# Patient Record
Sex: Female | Born: 1940 | Race: White | Hispanic: No | Smoking: Former smoker
Health system: Southern US, Community
[De-identification: ages and names within clinical notes are randomized; demographics above are authoritative.]

## PROBLEM LIST (undated history)

## (undated) DIAGNOSIS — E079 Disorder of thyroid, unspecified: Secondary | ICD-10-CM

## (undated) DIAGNOSIS — R251 Tremor, unspecified: Secondary | ICD-10-CM

## (undated) DIAGNOSIS — N189 Chronic kidney disease, unspecified: Secondary | ICD-10-CM

## (undated) DIAGNOSIS — F32A Depression, unspecified: Secondary | ICD-10-CM

## (undated) DIAGNOSIS — I639 Cerebral infarction, unspecified: Secondary | ICD-10-CM

## (undated) DIAGNOSIS — M1990 Unspecified osteoarthritis, unspecified site: Secondary | ICD-10-CM

## (undated) DIAGNOSIS — G629 Polyneuropathy, unspecified: Secondary | ICD-10-CM

## (undated) DIAGNOSIS — I10 Essential (primary) hypertension: Secondary | ICD-10-CM

## (undated) DIAGNOSIS — F329 Major depressive disorder, single episode, unspecified: Secondary | ICD-10-CM

## (undated) DIAGNOSIS — E785 Hyperlipidemia, unspecified: Secondary | ICD-10-CM

## (undated) DIAGNOSIS — E039 Hypothyroidism, unspecified: Secondary | ICD-10-CM

## (undated) DIAGNOSIS — F419 Anxiety disorder, unspecified: Secondary | ICD-10-CM

## (undated) HISTORY — PX: ABDOMINAL HYSTERECTOMY: SHX81

## (undated) HISTORY — PX: ANKLE SURGERY: SHX546

---

## 2004-06-13 ENCOUNTER — Ambulatory Visit: Payer: Self-pay | Admitting: Unknown Physician Specialty

## 2004-06-15 ENCOUNTER — Ambulatory Visit: Payer: Self-pay | Admitting: Unknown Physician Specialty

## 2004-10-05 ENCOUNTER — Ambulatory Visit: Payer: Self-pay | Admitting: Orthopedic Surgery

## 2005-04-13 ENCOUNTER — Emergency Department: Payer: Self-pay | Admitting: Emergency Medicine

## 2006-02-05 ENCOUNTER — Ambulatory Visit: Payer: Self-pay | Admitting: Family Medicine

## 2006-11-15 ENCOUNTER — Ambulatory Visit: Payer: Self-pay | Admitting: Orthopedic Surgery

## 2007-02-06 ENCOUNTER — Ambulatory Visit: Payer: Self-pay | Admitting: Emergency Medicine

## 2008-02-22 ENCOUNTER — Ambulatory Visit: Payer: Self-pay | Admitting: Family Medicine

## 2008-03-18 ENCOUNTER — Ambulatory Visit: Payer: Self-pay | Admitting: Orthopedic Surgery

## 2008-06-19 ENCOUNTER — Ambulatory Visit: Payer: Self-pay | Admitting: Internal Medicine

## 2008-07-18 ENCOUNTER — Ambulatory Visit: Payer: Self-pay | Admitting: Family Medicine

## 2008-10-11 ENCOUNTER — Ambulatory Visit: Payer: Self-pay | Admitting: Internal Medicine

## 2009-03-23 ENCOUNTER — Ambulatory Visit: Payer: Self-pay | Admitting: Family Medicine

## 2009-03-30 ENCOUNTER — Ambulatory Visit: Payer: Self-pay | Admitting: Internal Medicine

## 2009-04-17 ENCOUNTER — Emergency Department: Payer: Self-pay | Admitting: Emergency Medicine

## 2009-04-20 ENCOUNTER — Emergency Department: Payer: Self-pay | Admitting: Emergency Medicine

## 2010-05-20 ENCOUNTER — Ambulatory Visit: Payer: Self-pay | Admitting: Internal Medicine

## 2011-10-03 ENCOUNTER — Ambulatory Visit: Payer: Self-pay | Admitting: Nephrology

## 2013-06-13 ENCOUNTER — Observation Stay: Payer: Self-pay | Admitting: Specialist

## 2013-06-13 LAB — DRUG SCREEN, URINE
Barbiturates, Ur Screen: NEGATIVE (ref ?–200)
Cocaine Metabolite,Ur ~~LOC~~: NEGATIVE (ref ?–300)
Methadone, Ur Screen: NEGATIVE (ref ?–300)
Opiate, Ur Screen: NEGATIVE (ref ?–300)
Phencyclidine (PCP) Ur S: NEGATIVE (ref ?–25)
Tricyclic, Ur Screen: NEGATIVE (ref ?–1000)

## 2013-06-13 LAB — COMPREHENSIVE METABOLIC PANEL
Albumin: 3.7 g/dL (ref 3.4–5.0)
BUN: 24 mg/dL — ABNORMAL HIGH (ref 7–18)
Bilirubin,Total: 0.5 mg/dL (ref 0.2–1.0)
Calcium, Total: 9.7 mg/dL (ref 8.5–10.1)
Chloride: 100 mmol/L (ref 98–107)
Creatinine: 1.81 mg/dL — ABNORMAL HIGH (ref 0.60–1.30)
EGFR (African American): 31 — ABNORMAL LOW
EGFR (Non-African Amer.): 27 — ABNORMAL LOW
Osmolality: 275 (ref 275–301)
SGOT(AST): 36 U/L (ref 15–37)

## 2013-06-13 LAB — URINALYSIS, COMPLETE
Glucose,UR: NEGATIVE mg/dL (ref 0–75)
Hyaline Cast: 38
Nitrite: NEGATIVE
Ph: 5 (ref 4.5–8.0)
RBC,UR: 1 /HPF (ref 0–5)
Specific Gravity: 1.013 (ref 1.003–1.030)
Squamous Epithelial: 21

## 2013-06-13 LAB — CBC WITH DIFFERENTIAL/PLATELET
Eosinophil #: 0.4 10*3/uL (ref 0.0–0.7)
HCT: 42.9 % (ref 35.0–47.0)
HGB: 14.6 g/dL (ref 12.0–16.0)
Lymphocyte %: 31.1 %
MCH: 31.4 pg (ref 26.0–34.0)
MCV: 92 fL (ref 80–100)
Monocyte #: 1.1 x10 3/mm — ABNORMAL HIGH (ref 0.2–0.9)
Neutrophil #: 5.3 10*3/uL (ref 1.4–6.5)
Neutrophil %: 53.3 %

## 2013-06-13 LAB — MAGNESIUM: Magnesium: 1.6 mg/dL — ABNORMAL LOW

## 2013-06-13 LAB — SEDIMENTATION RATE: Erythrocyte Sed Rate: 5 mm/hr (ref 0–30)

## 2013-06-13 LAB — LIPASE, BLOOD: Lipase: 414 U/L — ABNORMAL HIGH (ref 73–393)

## 2013-06-13 LAB — CK TOTAL AND CKMB (NOT AT ARMC): CK, Total: 55 U/L (ref 21–215)

## 2013-06-13 LAB — TROPONIN I: Troponin-I: 0.19 ng/mL — ABNORMAL HIGH

## 2013-06-13 LAB — ETHANOL: Ethanol %: <0.003 % (ref 0.000–0.080)

## 2013-06-14 ENCOUNTER — Ambulatory Visit: Payer: Self-pay | Admitting: Neurology

## 2013-06-14 LAB — TROPONIN I
Troponin-I: 0.17 ng/mL — ABNORMAL HIGH
Troponin-I: 0.15 ng/mL — ABNORMAL HIGH

## 2013-06-14 LAB — CBC WITH DIFFERENTIAL/PLATELET
Basophil #: 0.1 10*3/uL (ref 0.0–0.1)
Basophil %: 0.6 %
Eosinophil %: 4.9 %
HCT: 39.7 % (ref 35.0–47.0)
Lymphocyte #: 3.3 10*3/uL (ref 1.0–3.6)
Lymphocyte %: 36.4 %
MCH: 31.3 pg (ref 26.0–34.0)
MCHC: 34.1 g/dL (ref 32.0–36.0)
MCV: 92 fL (ref 80–100)
Monocyte #: 0.9 x10 3/mm (ref 0.2–0.9)
Neutrophil %: 47.8 %
RDW: 13.7 % (ref 11.5–14.5)
WBC: 9 10*3/uL (ref 3.6–11.0)

## 2013-06-14 LAB — LIPID PANEL
Ldl Cholesterol, Calc: 124 mg/dL — ABNORMAL HIGH (ref 0–100)
Triglycerides: 245 mg/dL — ABNORMAL HIGH (ref 0–200)
VLDL Cholesterol, Calc: 49 mg/dL — ABNORMAL HIGH (ref 5–40)

## 2013-06-14 LAB — BASIC METABOLIC PANEL
BUN: 23 mg/dL — ABNORMAL HIGH (ref 7–18)
Chloride: 100 mmol/L (ref 98–107)
Glucose: 111 mg/dL — ABNORMAL HIGH (ref 65–99)
Osmolality: 278 (ref 275–301)
Potassium: 3.5 mmol/L (ref 3.5–5.1)
Sodium: 137 mmol/L (ref 136–145)

## 2013-06-14 LAB — CK TOTAL AND CKMB (NOT AT ARMC)
CK, Total: 77 U/L (ref 21–215)
CK-MB: 1.2 ng/mL (ref 0.5–3.6)
CK-MB: 1.1 ng/mL (ref 0.5–3.6)

## 2013-06-15 LAB — CBC WITH DIFFERENTIAL/PLATELET
Eosinophil #: 0.6 10*3/uL (ref 0.0–0.7)
Eosinophil %: 7.3 %
HCT: 39 % (ref 35.0–47.0)
Lymphocyte %: 37.5 %
MCH: 31.3 pg (ref 26.0–34.0)
MCHC: 33.7 g/dL (ref 32.0–36.0)
MCV: 93 fL (ref 80–100)
Monocyte %: 9.6 %
Neutrophil %: 44.6 %
RBC: 4.2 10*6/uL (ref 3.80–5.20)
RDW: 13.7 % (ref 11.5–14.5)
WBC: 7.6 10*3/uL (ref 3.6–11.0)

## 2013-06-15 LAB — BASIC METABOLIC PANEL
Anion Gap: 5 — ABNORMAL LOW (ref 7–16)
BUN: 23 mg/dL — ABNORMAL HIGH (ref 7–18)
Calcium, Total: 9.7 mg/dL (ref 8.5–10.1)
Co2: 29 mmol/L (ref 21–32)
EGFR (African American): 38 — ABNORMAL LOW
Sodium: 139 mmol/L (ref 136–145)
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
## 2014-12-10 NOTE — H&P (Signed)
PATIENT NAME:  Jenna Powers, Jenna Powers MR#:  [id number] DATE OF BIRTH:  03/14/1941  DATE OF ADMISSION:  06/13/2013  PRIMARY CARE PROVIDER: Burley Saver, MD  EMERGENCY DEPARTMENT REFERRING PHYSICIAN: Eartha Inch. York Cerise, MD  CHIEF COMPLAINT: Altered mental status, hand numbness, some visual difficulties.   HISTORY OF PRESENT ILLNESS: The patient is a 74 year old white female with history of chronic back pain, depression, hypothyroidism, history of ADHD, hypertension, migraine headaches, who reports that about 2 months ago she was on blood pressure medications and stopped taking these because she felt like she did not need the medications and was taking too many medications. She reports yesterday she started having numbness in her left hand and went to the pharmacy and was noted to have a blood pressure that was elevated, so was seen by Dr. Quillian Quince and was started back on her lisinopril and HCTZ. The patient reports that this a.m. she was watching TV and on the right side when the letters were coming for a TV show, "[employer] Beautiful," she could not see the right side of the letters. She stated that that was blurred. Therefore, she went to an urgent care at [facility], where she was observed to be wandering around in a parking lot. According to the patient, she was just looking for [facility]. She has been a little confused here in the ER. Initially she said it was January 2014, then she was saying it was March 2014. She otherwise is able to tell me other things. She reports that she has not had any chest pains or shortness of breath. Denies any fevers, chills. No abdominal pain, nausea, vomiting or diarrhea. Denies any urinary frequency, urgency or hesitancy.   PAST MEDICAL HISTORY: Significant for chronic back pain, history of hypothyroidism, depression, ADHD, hypertension, migraines.   PAST SURGICAL HISTORY: Status post cholecystectomy, status post hysterectomy, right ankle replacement.   SOCIAL HISTORY: Does  not smoke. Drinks wine, a glass daily. No drug use. Lives by herself.   ALLERGIES: CODEINE.   MEDICATIONS: She is on propranolol 80 one tab p.o. daily, aspirin 81 one tab p.o. daily, lisinopril/HCTZ 20/12.5 daily, Celebrex 200 daily, oxybutynin 5 one tab p.o. t.i.d., tramadol 50 with 1 to 2 tabs q.8 p.r.n. The patient does report she is on Prozac, but we do not have that dosage. She also reports that she takes some sleeping pill. She is not sure of the name.  REVIEW OF SYSTEMS:    CONSTITUTIONAL: Denies any fevers, fatigue, weakness. Has chronic low back pain. No weight loss. No weight gain.  EYES: Does complain of some blurred vision, which has since resolved this morning. No pain. No redness. No inflammation. No glaucoma. No cataracts.  EARS, NOSE, THROAT: No tinnitus. No ear pain. No hearing loss. No seasonal or year-round allergies. No epistaxis. No nasal discharge. No difficulty swallowing.  RESPIRATORY: Denies any cough, wheezing, hemoptysis. No COPD. No TB. CARDIOVASCULAR: Denies any chest pain, orthopnea, edema or arrhythmia.  GASTROINTESTINAL: No nausea, vomiting, diarrhea. No abdominal pain. No hematemesis. No melena. No ulcer. No GERD. No IBS. No jaundice.  GENITOURINARY: Denies any dysuria, hematuria, renal calculus or frequency.  ENDOCRINE: Denies any polyuria, nocturia or thyroid problems.  HEMATOLOGIC AND LYMPHATIC: Denies anemia, easy bruisability or bleeding.  SKIN: No acne. No rash. No changes in mole, hair or skin.  MUSCULOSKELETAL: Denies any pain in neck. Does have chronic back pain.  NEUROLOGIC: Hand numbness yesterday. No history of CVA, TIA or seizures.  PSYCHIATRIC: Has a history  of anxiety, depression.   PHYSICAL EXAMINATION: VITAL SIGNS: Temperature 97.8, pulse 43, respirations 16, blood pressure 137/110.  GENERAL: The patient is a well-developed, well-nourished female in no acute distress.  HEENT: Head atraumatic, normocephalic. Pupils equally round, reactive to  light and accommodation. There is no conjunctival pallor. No scleral icterus. Nasal exam shows no drainage or ulceration. Oropharynx is clear without any exudate.  NECK: Supple without any JVD.  CARDIOVASCULAR: Regular rate and rhythm. No murmurs, rubs, clicks or gallops.  RESPIRATORY: Good respiratory effort. Clear to auscultation bilaterally without any rales, rhonchi, wheezing.  ABDOMEN: Soft, nontender, nondistended. Positive bowel sounds x 4. No hepatosplenomegaly.  GENITOURINARY: Deferred.  MUSCULOSKELETAL: There is no erythema or swelling.  SKIN: No rash.  LYMPHATICS: No lymph nodes palpable.  VASCULAR: Good DP, PT pulses.  NEUROLOGIC: Currently, cranial nerves II through XII grossly intact. DTRs are symmetric, reflexes 2+. Babinski is downgoing.  PSYCHIATRIC: The patient is not anxious or depressed.   LABORATORY DATA: Glucose 112, BUN 24, creatinine 1.81, sodium 135, potassium 4.4, chloride 100, CO2 of 29. Calcium is 9.7. Magnesium is 1.6. Lipase 416. LFTs are normal. Troponin 0.19. TSH is 3.66.WBC 10, hemoglobin 14.6. Sedimentation rate 5. Urinalysis is bacteria 1+.,  WBCs only 5.   ASSESSMENT AND PLAN: The patient is a 74 year old white female with history of chronic back pain, hypertension, hypothyroidism, depression, attention deficit/hyperactivity disorder, presents with unusual constellation of symptoms including left hand numbness, visual difficulties, some confusion.  1.  Acute encephalopathy with visual difficulties and numbness, possibly due to transient ischemic attack/cerebrovascular accident. At this time, will treat her with aspirin, get an MRI of the brain, check carotid Dopplers and echocardiogram of the heart. Her presentation is very unusual. Need to make sure that this is not a psychiatric cause for her symptoms as well. I will have psychiatry come to see the patient, also ask neurology to see the patient.  2.  Hypertension: We will continue lisinopril. Hold propranolol  due to low heart rate.  3.  Bradycardia: Possibly due to propranolol therapy. We will hold her propranolol.  4.  Depression: On  Prozac at home. Currently, dose not available. Resume this once dose is known. 5.  History of hypothyroidism: Stop supplements. Her TSH seems to be normal at this time.  6.  Attention deficit/hyperactivity disorder: Was on Ritalin, which was stopped 2 months ago. 7.  Elevated troponin: We will check serial cardiac enzymes. Aspirin. Echocardiogram. The ED physician has spoken to Dr. Gwen PoundsKowalski who will see.  8.  Elevated creatinine: No baseline creatinine available. We will give her IV fluids, hold HCTZ.  9.  Miscellaneous: I will place her on Lovenox for deep vein thrombosis prophylaxis.   TIME SPENT: 45 minutes spent on this H and P.    ____________________________ Lacie ScottsShreyang H. Allena KatzPatel, MD shp:jm D: 06/13/2013 19:08:24 ET T: 06/13/2013 19:50:31 ET JOB#: [id number]  cc: Jozette Castrellon H. Allena KatzPatel, MD, <Dictator> Charise CarwinSHREYANG H Darl Kuss MD ELECTRONICALLY SIGNED 06/16/2013 12:13

## 2014-12-10 NOTE — Consult Note (Signed)
PATIENT NAME:  Jenna PlowmanVAN SAVAGE, Jenna Powers MR#:  [id number] DATE OF BIRTH:  [birth year]  DATE OF CONSULTATION:  06/14/2013  REFERRING PHYSICIAN:   CONSULTING PHYSICIAN:  Kinsly Hild K. Elna Radovich, MD  SUBJECTIVE:  The patient was seen in consultation in room number 258.  The patient is Powers 74 year old white female who is retired and is widowed after her third marriage for 5 years and living by herself.  The patient reports that in Powers span of 5 years she lost several of her family members and she took care of them.  This includes Powers husband, her mother, Powers sister, Powers brother and this has caused her to have lots of depression.  The patient is being followed for depression by her PCP, who gave her Prozac 20 mg twice Powers day which held her depression for many years but currently she is feeling low and down and depressed.    PAST PSYCHIATRIC HISTORY:  No previous history of inpatient psychiatry.  No suicidal ideation.  Not being followed by Powers psychiatrist.    ALCOHOL AND DRUGS:  Has an occasional wine.  Smoked 1 cigarette in Powers span of 1 year.    MENTAL STATUS EXAMINATION:  The patient is dressed in hospital clothes, alert and oriented, calm, pleasant and cooperative.  Affect is appropriate with her mood which is low and down and depressed.  At times, she feels worthless and useless, and hopeless because of her not being able to do things and recently being forgetful because of being very anxious and depressed.  No psychosis.  Denies suicidal or homicidal plans and wants to get help with medication for her depression and anxiety, and forgetfulness.  Insight and judgment fair and adequate.    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  RECOMMEND:  Increase her Prozac to 60 mg daily.  Add Abilify 5 mg daily to act as an adjunct.  Recommend Aricept which will improve her cognition, make her feel better.    ____________________________ Jannet MantisSurya K. Guss Bundehalla, MD skc:cs D: 06/14/2013 14:50:58  ET T: 06/14/2013 19:51:24 ET JOB#: [id number]  cc: Monika SalkSurya K. Guss Bundehalla, MD, <Dictator> Beau FannySURYA K Kashena Novitski MD ELECTRONICALLY SIGNED 06/15/2013 8:46

## 2014-12-10 NOTE — Discharge Summary (Signed)
PATIENT NAME:  Jenna Powers, Jenna Powers MR#:  [id number] DATE OF BIRTH:  [birth year]  DATE OF ADMISSION:  06/13/2013 DATE OF DISCHARGE:  06/15/2013  For a detailed note, please take a look at the history and physical done on admission by Dr. Auburn Bilberry.   DIAGNOSES AT DISCHARGE: 1.  Altered mental status, likely to underlying dementia and early cognitive decline.  2.  Hypertension.  3.  Chronic kidney disease stage III elevated troponin right in the setting of demand ischemia, urinary incontinence.   DIET: The patient is being discharged on a low-sodium, low-fat diet.   ACTIVITY: As tolerated.   FOLLOW-UP: Dr. Gwen Pounds in the next 1 to 2 weeks. Also, follow up with Dr. Rolin Barry  in the next 1 to 2 weeks.   DISCHARGE MEDICATIONS:  Celebrex 200 mg daily,  Tramadol 50 mg 1 to 2 tabs q. 8 hours as needed, aspirin 81 mg daily, propranolol 80 mg daily, fluoxetine 60 mg daily, Abilify 5 mg daily, Aricept 500 mg bedtime, lisinopril 20 mg daily and Pravachol 20 mg at bedtime.   CONSULTANTS DURING THE HOSPITAL COURSE:  1.  Dr. Olin Pia from neurology.    2.  Dr. Guss Bunde from psychiatry.  3.  Dr. Arnoldo Hooker from cardiology.   PERTINENT STUDIES DONE DURING THE HOSPITAL COURSE: A CT scan of the head done without contrast on admission showing no acute intracranial process, chronic involutional changes.   A chest x-ray done on admission showing no acute cardiopulmonary disease. An MRI of the brain done without contrast showing no evidence of any acute abnormalities. An ultrasound of the carotids showing no hemodynamically significant carotid artery stenosis. A 2-dimensional echocardiogram showing ejection fraction of 35% to 40%, mildly decreased global LV systolic function, moderately dilated left atrium, mildly dilated right atrium, mild to moderate mitral valve regurgitation, mildly elevated pulmonary artery systolic pressures, mild to moderate tricuspid regurgitation.   HOSPITAL COURSE: This is  a 74 year old female with medical problems as mentioned above, presented to the hospital due to altered mental status.  1.  Altered mental status. The most likely cause of this was probably cognitive decline in underlying dementia. There was some concern initially that the patient may have had a stroke as she was having some right arm numbness. She underwent extensive work-up including CT head MRI brain a carotid duplex, which were all negative. The patient was seen by neurology who did not think that the patient an acute stroke. The patient was seen by physical therapy who recommended an outpatient services. The patient's urine toxicology was also clean. She had no other focal metabolic or infectious etiology of her altered mental status. The patient was seen by psychiatry and thought that her she may have early cognitive decline with underlying depression. Her Prozac dose was increased from 20 mg a 60 mg. Abilify was also added and the patient was started on low-dose Aricept. The patient was discharged on all those meds.  2.  Elevated troponin. The patient acutely had no chest pain. She had no acute EKG changes. Her elevated troponin could be related to poor renal clearance versus underlying demand ischemia. The patient was maintained on her aspirin and Inderal. She will resume that. The patient was seen by cardiology who did not want to do any acute interventional in the hospital. She had an echocardiogram, which showed mild LV dysfunction. I discussed the case with Dr. Gwen Pounds, who plans on doing an outpatient stress test. The patient will have follow-up Dr. Gwen Pounds next  week arranged as an outpatient.  3.  Chronic kidney disease stage III. This is probably related to her underlying hypertension. Her creatinine further needs to be followed as an outpatient by her primary care physician.  4.  Hypertension. The patient remained hemodynamically on lisinopril and Inderal. She will resume that.  5.   Depression. A psychiatric consult was obtained. The patient was seen by Dr. Guss Bundehalla who recommended starting the patient on low-dose Abilify along with, Aricept and also increasing her Prozac and those changes, were made prior to discharge.   CODE STATUS:  The patient is a full code.   DISPOSITION: She is being discharged home.   TIME SPENT: 40 minutes.   ____________________________ Jenna Powers, Jenna Powers vjs:cc D: 06/15/2013 15:03:29 ET T: 06/15/2013 23:17:50 ET JOB#: [id number]  cc: Jenna Powers, Jenna Powers, <Dictator> Jenna BlinksBruce J. Powers, Jenna Powers Jenna Powers Jenna Powers ELECTRONICALLY SIGNED 06/17/2013 14:34

## 2014-12-10 NOTE — Consult Note (Signed)
PATIENT NAME:  Jenna PlowmanVAN SAVAGE, Yuleidy A MR#:  [id number] DATE OF BIRTH:  28-Oct-1940  DATE OF CONSULTATION:  06/14/2013  REFERRING PHYSICIAN:  Dr. Allena KatzPatel. CONSULTING PHYSICIAN:  Lamar BlinksBruce J. Kupono Marling, MD  REASON FOR CONSULTATION: Bradycardia, elevated troponin, chronic kidney disease, transient ischemic attack and/or stroke, unsteadiness and hypertension.   CHIEF COMPLAINT: "I just was unsteady and I had numbness."   HISTORY OF PRESENT ILLNESS: This is a 74 year old female with known chronic kidney disease with a creatinine of 1.8, hypertension, hyperlipidemia, who has had some bradycardia in the past due to propranolol use, of which the patient has been using this for tremors. The patient has had no evidence of symptoms of syncope or dizziness, but has been somewhat unsteady over the last day or two, as well as had some numbness in her left arm and some tongue-tied. The patient has had hypertension as well with appropriate medication and chronic kidney disease. Currently with this admission the patient has had an EKG showing sinus bradycardia, but otherwise no evidence of new EKG changes or heart block by telemetry. She does have an elevated troponin at 0.19 of unknown etiology at this point and trending steadily and evenly without evidence of increase. The patient currently feels well with no further evidence of chest discomfort or heart failure type symptoms.   REVIEW OF SYSTEMS: The remainder review of systems negative for vision change, ringing in the ears, hearing loss, cough, congestion, heartburn, nausea, vomiting, diarrhea, bloody stools, stomach pain, extremity pain, leg weakness, cramping of the buttocks, known blood clots, headaches, blackouts, dizzy spells, nosebleeds, congestion, trouble swallowing, frequent urination, urination at night, muscle weakness, numbness, numbness as occurring in her left arm and some weakness in her lower extremities, no frequent urination, urination at night, muscle  abnormality.   PAST MEDICAL HISTORY: 1. Bradycardia.  2. Chronic kidney disease.  3. Hypertension.  4. Hyperlipidemia.  5. Tremor.  6. Chronic back pain.   FAMILY HISTORY: Father had a stroke. Mother had no cardiovascular stains.   SOCIAL HISTORY: She has remote tobacco abuse and does drink alcohol.   ALLERGIES: As listed.   MEDICATIONS: As listed.   PHYSICAL EXAMINATION: VITAL SIGNS: Blood pressure 126/68 bilaterally, heart rate is 52 upright, reclining, and regular.  GENERAL: She is a well-appearing elderly female in no acute distress.  HEENT: No icterus, thyromegaly, ulcers, hemorrhage, or xanthelasma.  CARDIOVASCULAR: Regular rate and rhythm with normal S1 and S2 without murmur, gallop, or rub. PMI is normal size and placement. Carotid upstroke normal without bruit. Jugular venous pressure is normal.  LUNGS: Lungs have few basilar crackles with normal respirations.  ABDOMEN: Soft, nontender, without hepatosplenomegaly or masses. Abdominal aorta is normal size without bruit.  EXTREMITIES: 2+ radial, femoral, dorsal pedal pulses, with no lower extremity edema, cyanosis, clubbing or ulcers.  NEUROLOGIC: She is oriented to time, place, and person, with normal mood and affect.   ASSESSMENT: This is a 74 year old female with chronic kidney disease, hypertension, hyperlipidemia with transient ischemic attack type symptoms, unsteadiness and bradycardia. All of unknown etiology and significance at this time.   RECOMMENDATIONS: 1. Continue serial ECG and enzymes to assess for possible myocardial infarction and/or further need in treatment options.  2. Continue hypertension control with possible ACE inhibitor due to chronic kidney disease.  3. Continue telemetry and  ambulate with telemetry and possible treadmill EKG and/or stress test to evaluate extent of rhythm disturbances, chronotropic incompetence for myocardial ischemia needing further treatment options. The patient may need to  discontinue  propranolol due to bradycardia.  4. Echocardiogram for LV systolic dysfunction, valvular heart disease causing symptoms listed above.  5. Further treatment options after above.   ____________________________ Lamar Blinks, MD bjk:sg D: 06/14/2013 08:56:00 ET T: 06/14/2013 13:09:54 ET JOB#: [id number]  cc: Lamar Blinks, MD, <Dictator> Lamar Blinks MD ELECTRONICALLY SIGNED 06/19/2013 14:46

## 2015-08-08 ENCOUNTER — Ambulatory Visit
Admission: EM | Admit: 2015-08-08 | Discharge: 2015-08-08 | Disposition: A | Discharge status: Home / Self Care | Payer: Medicare Other | Attending: Emergency Medicine | Admitting: Emergency Medicine

## 2015-08-08 ENCOUNTER — Encounter: Payer: Self-pay | Admitting: *Deleted

## 2015-08-08 ENCOUNTER — Ambulatory Visit (INDEPENDENT_AMBULATORY_CARE_PROVIDER_SITE_OTHER): Payer: Medicare Other

## 2015-08-08 DIAGNOSIS — S5292XA Unspecified fracture of left forearm, initial encounter for closed fracture: Secondary | ICD-10-CM

## 2015-08-08 HISTORY — DX: Essential (primary) hypertension: I10

## 2015-08-08 HISTORY — DX: Depression, unspecified: F32.A

## 2015-08-08 HISTORY — DX: Hyperlipidemia, unspecified: E78.5

## 2015-08-08 HISTORY — DX: Disorder of thyroid, unspecified: E07.9

## 2015-08-08 HISTORY — DX: Major depressive disorder, single episode, unspecified: F32.9

## 2015-08-08 HISTORY — DX: Tremor, unspecified: R25.1

## 2015-08-08 HISTORY — DX: Polyneuropathy, unspecified: G62.9

## 2015-08-08 MED ORDER — TRAMADOL HCL 50 MG PO TABS: 50.0000 mg | ORAL_TABLET | Freq: Two times a day (BID) | ORAL | Status: DC

## 2015-08-08 NOTE — ED Notes (Signed)
Ready for discharge. Wrist splint and sling applied. Waiting for L. Hyacinth MeekerMiller NP to speak with her

## 2015-08-08 NOTE — ED Provider Notes (Signed)
[facility]  ____________________________________________  Time seen: Approximately 4:44 PM  I have reviewed the triage vital signs and the nursing notes.   HISTORY  Chief Complaint Joint Swelling and Wrist Pain   HPI Jenna Powers is a 74 y.o. female presents with a complaint of left wrist pain. Patient reports that one week ago she was at home and states that ine the middle of the night she had to go to the bathroom. states she did not turn the light on. Patient states that she went to sit down the toilet however missed the toilet and fell to the floor. Patient states that she did reach out and catch herself with her left arm. Patient states that since then she has had left wrist pain. Denies head injury or loss of consciousness. Denies other pain or injury. Denies neck or back pain or injury.  Patient states that she has continued with left wrist pain throughout the week. Patient states that current left wrist pain is 3 out of 10 and reports maximum pain 7/10. Denies pain radiation. Patient denies numbness or tingling sensations or decreased range of motion. Patient reports that she fell only because she missed the toilet when sitting down. Patient states that she felt fine prior to the fall. Patient also reports that other than left wrist pain she felt fine after the fall. Patient states that she was able to get herself up without anyone's help. Denies other fall recently.  Patient reports that she has been taking Celebrex for her pain.  Patient states that this helps with her pain overall but does not always help. Patient reports that she is right hand dominant.  PCP: Dr. Quillian Quince   Past Medical History  Diagnosis Date  . Hypertension   . Thyroid disease   . Hyperlipemia   . Depression   . Tremor   . Neuropathy (HCC)     There are no active problems to display for this patient.   Past Surgical History  Procedure Laterality Date  . Abdominal hysterectomy    .  Ankle surgery Right     Current Outpatient Rx  Name  Route  Sig  Dispense  Refill  . aspirin EC 81 MG tablet   Oral   Take 81 mg by mouth daily.         .             . losartan-hydrochlorothiazide (HYZAAR) 50-12.5 MG tablet   Oral   Take 1 tablet by mouth daily.         Marland Kitchen oxybutynin (DITROPAN) 5 MG tablet   Oral   Take 5 mg by mouth 2 (two) times daily.          .             clebrex  Allergies Review of patient's allergies indicates no known allergies.  History reviewed. No pertinent family history.  Social History Social History  Substance Use Topics  . Smoking status: Current Some Day Smoker  . Smokeless tobacco: Never Used  . Alcohol Use: No    Review of Systems Constitutional: No fever/chills Eyes: No visual changes. ENT: No sore throat. Cardiovascular: Denies chest pain. Respiratory: Denies shortness of breath. Gastrointestinal: No abdominal pain.  No nausea, no vomiting.  No diarrhea.  No constipation. Genitourinary: Negative for dysuria. Musculoskeletal: Negative for back pain. Positive left wrist pain. Skin: Negative for rash. Neurological: Negative for headaches, focal weakness or numbness.  10-point ROS otherwise negative.  ____________________________________________   PHYSICAL EXAM:  VITAL SIGNS: ED Triage Vitals  Enc Vitals Group     BP 08/08/15 1605 162/81 mmHg     Pulse Rate 08/08/15 1605 58     Resp 08/08/15 1605 18     Temp 08/08/15 1605 97.5 F (36.4 C)     Temp Source 08/08/15 1605 Oral     SpO2 08/08/15 1605 97 %     Weight 08/08/15 1605 136 lb (61.689 kg)     Height 08/08/15 1605 5\' 6"  (1.676 m)     Head Cir --      Peak Flow --      Pain Score 08/08/15 1629 6     Pain Loc --      Pain Edu? --      Excl. in GC? --     Constitutional: Alert and oriented. Well appearing and in no acute distress. Eyes: Conjunctivae are normal. PERRL. EOMI. Head: Atraumatic.  Nose: No congestion/rhinnorhea.  Mouth/Throat: Mucous  membranes are moist.  Oropharynx non-erythematous. Neck: No stridor.  No cervical spine tenderness to palpation. Hematological/Lymphatic/Immunilogical: No cervical lymphadenopathy. Cardiovascular: Normal rate, regular rhythm. Grossly normal heart sounds.  Good peripheral circulation. Respiratory: Normal respiratory effort.  No retractions. Lungs CTAB. No wheezes, rales or rhonchi. Gastrointestinal: Soft and nontender. No distention. Normal Bowel sounds.  No abdominal bruits. No CVA tenderness. Musculoskeletal: No lower or upper extremity tenderness nor edema.  No cervical, thoracic or lumbar tenderness to palpation with full range of motion. Bilateral upper and lower extremities full range of motion and nontender except left distal wrist. Left distal radius mild to moderate tenderness to palpation with positive deformity, no pain with rotation, mild pain with wrist flexion, mild swelling, no ecchymosis, skin intact, left hand nontender, cap refill less than 2 seconds to all left fingers, bilateral distal radial pulses equal and easily palpable,  left hand grip slightly weaker than right. Left upper extremity otherwise nontender. Neurologic:  Normal speech and language. No gross focal neurologic deficits are appreciated. No gait instability. Skin:  Skin is warm, dry and intact. No rash noted. Psychiatric: Mood and affect are normal. Speech and behavior are normal.  ____________________________________________   LABS (all labs ordered are listed, but only abnormal results are displayed)  Labs Reviewed - No data to display  RADIOLOGY  EXAM: LEFT WRIST - COMPLETE 3+ VIEW  COMPARISON: None.  FINDINGS: Frontal, oblique lateral, and ulnar deviation scaphoid images were obtained. There is a comminuted fracture of the distal radial metaphysis with dorsal angulation distally. There is impaction at the fracture site. There are several small a avulsed fragments in this area. No other fracture is  apparent. No dislocation. There is mild narrowing at the scaphoid trapezial joint. There is also osteoarthritic change in all MCP joints. Calcification is noted in the triangular fibrocartilage region, likely due to chronic tear. Calcification is also noted between the radial styloid and scaphoid, likely due to chronic tear in this area as well.  IMPRESSION: Comminuted fracture distal radial metaphysis with impaction at the fracture site. There is dorsal angulation distally. No dislocation. Several areas of osteoarthritic change. Calcification in the triangular fibrocartilage may indicate prior tear in this area, chronic. Second probable chronic tear is located between the radial styloid and scaphoid bone.   Electronically Signed By: Bretta BangWilliam Woodruff III M.D. On: 08/08/2015 17:18  I, Renford DillsLindsey Kierstan Auer, personally viewed and evaluated these images (plain radiographs) as part of my medical decision making.   ____________________________________________   PROCEDURES  Procedure(s) performed:  Splint applied by Bennetta Laos. Left distal dorsal volar OCL splint. Neurovascular intact post application. ____________________________________________   INITIAL IMPRESSION / ASSESSMENT AND PLAN / ED COURSE  Pertinent labs & imaging results that were available during my care of the patient were reviewed by me and considered in my medical decision making (see chart for details).  Very well-appearing patient. No acute distress. Presents with complaint of left wrist pain post mechanical fall 1 week ago. Denies head injury or loss of  consciousness. Denies other pain or injury. Denies neck or back pain or injury. No other extremity tenderness. Lungs clear throughout. Abdomen soft and nontender. Left distal wrist positive deformity with mild distal radial pain. Will evaluate by x-ray.  Left wrist x-ray comminuted fracture distal radial metaphysis with impaction of the fracture site, there is  dorsal angulation distally, no dislocation, several areas of osteoarthritic change per radiology.  1730: Orthopedic on call paged.  1745: Discussed patient with Dr. Martha Clan orthopedic. Dr. Martha Clan reviewed x-rays. Dr. Martha Clan recommends dorsal volar OCL splint and sling, And will see patient in office this week in 2-3 days. Dr. Martha Clan recommends patient to call office tomorrow to schedule appointment.  Dorsal and volar OCL Left wrist and forearm splint applied and sling applied. Patient directed to apply ice and elevate. When necessary tramadol as needed for breakthrough pain. Patient reports has taken tramadol in past and tolerated well. Patient verbalizes she will follow-up with orthopedic this week and she will call tomorrow to schedule.   Discussed follow up with Primary care physician this week. Discussed follow up and return parameters including no resolution or any worsening concerns. Patient verbalized understanding and agreed to plan.   ____________________________________________   FINAL CLINICAL IMPRESSION(S) / ED DIAGNOSES  Final diagnoses:  Radial fracture, left, closed, initial encounter       Renford Dills, NP 08/08/15 2144  Renford Dills, NP 08/08/15 2146

## 2015-08-08 NOTE — ED Notes (Signed)
Patient fell in bathroom one week ago and fell on her hand damaging her left wrist. Wrist do show swelling and deformity. Patient is able to use wrist moderately.

## 2015-08-08 NOTE — Discharge Instructions (Signed)
Take medication only as needed as we discussed. Continue taking home medication. Apply ice and elevate. Keep in splint.  As discussed her to follow-up with orthopedic Dr. Martha ClanKrasinski this week. See above to call tomorrow to schedule appointment.  Return to urgent care as needed for increased pain, swelling, numbness or tingling sensation, new or worsening concerns.

## 2016-01-23 ENCOUNTER — Encounter: Payer: Self-pay | Admitting: *Deleted

## 2016-01-23 ENCOUNTER — Ambulatory Visit
Admission: EM | Admit: 2016-01-23 | Discharge: 2016-01-23 | Disposition: A | Discharge status: Home / Self Care | Payer: Medicare Other | Attending: Emergency Medicine | Admitting: Emergency Medicine

## 2016-01-23 ENCOUNTER — Ambulatory Visit (INDEPENDENT_AMBULATORY_CARE_PROVIDER_SITE_OTHER)
Admit: 2016-01-23 | Discharge: 2016-01-23 | Disposition: A | Discharge status: Home / Self Care | Payer: Medicare Other | Attending: Emergency Medicine | Admitting: Emergency Medicine

## 2016-01-23 DIAGNOSIS — W19XXXA Unspecified fall, initial encounter: Secondary | ICD-10-CM

## 2016-01-23 DIAGNOSIS — S0101XA Laceration without foreign body of scalp, initial encounter: Secondary | ICD-10-CM

## 2016-01-23 DIAGNOSIS — S0990XA Unspecified injury of head, initial encounter: Secondary | ICD-10-CM | POA: Diagnosis not present

## 2016-01-23 MED ORDER — TETANUS-DIPHTH-ACELL PERTUSSIS 5-2.5-18.5 LF-MCG/0.5 IM SUSP
0.5000 mL | Freq: Once | INTRAMUSCULAR | Status: AC
  Administered 2016-01-23: 0.5 mL via INTRAMUSCULAR

## 2016-01-23 NOTE — ED Notes (Signed)
Pt slipped and fell this am. C/o 3-4" laceration to occipital scalp region. Bleeding controlled. Pt denies LOC also denies neck pain.

## 2016-01-23 NOTE — Discharge Instructions (Signed)
Follow-up with your primary care physician as needed. Return here in 10 days for staple removal. Go to the ER for any signs of infection, nausea, vomiting, blurry or double vision, headache, if you have trouble talking, trouble walking, trouble getting her words out, arm or leg weakness, facial droop, or other concerns.

## 2016-01-23 NOTE — ED Provider Notes (Signed)
HPI  SUBJECTIVE:  Jenna Powers is a 75 y.o. female who presents with a laceration to the occiput of her head. Patient states that she had a slip and fall, hitting the back of her head on the floor at 0500 this morning. She took 3 Tylenol with back to bed. There are no other aggravating or alleviating factors. She denies loss of consciousness, amnesia, chest pain, shortness breath, palpitations, presyncope or syncope causing her fall. She denies neck pain, thoracic pain, back pain. She denies any other injury. No leg or arm weakness, numbness, dysarthria, aphasia, visual changes, discoordination. She denies any headache. She states that she has pain only in the area of the laceration. 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 Also history of chronic kidney disease, hypertension, dementia. She was by herself. No history of MI, arrhythmia, atrial fibrillation, diabetes. PMD: Dr. Quillian QuinceBliss    Past Medical History  Diagnosis Date  . Hypertension   . Thyroid disease   . Hyperlipemia   . Depression   . Tremor   . Neuropathy [facility](HCC)     Past Surgical History  Procedure Laterality Date  . Abdominal hysterectomy    . Ankle surgery Right     History reviewed. No pertinent family history.  Social History  Substance Use Topics  . Smoking status: Current Some Day Smoker  . Smokeless tobacco: Never Used  . Alcohol Use: No    No current facility-administered medications for this encounter.  Current outpatient prescriptions:  .  clopidogrel (PLAVIX) 300 MG TABS tablet, Take 300 mg by mouth once., Disp: , Rfl:  .  aspirin EC 81 MG tablet, Take 81 mg by mouth daily., Disp: , Rfl:  .  FLUoxetine (PROZAC) 40 MG capsule, Take 40 mg by mouth daily., Disp: , Rfl:  .  losartan-hydrochlorothiazide (HYZAAR) 50-12.5 MG tablet, Take 1 tablet by mouth daily., Disp: , Rfl:  .  oxybutynin (DITROPAN) 5 MG tablet, Take 5 mg by mouth 2 (two)  times daily. , Disp: , Rfl:  .  traMADol (ULTRAM) 50 MG tablet, Take 1 tablet (50 mg total) by mouth 2 (two) times daily., Disp: 10 tablet, Rfl: 0 .  traZODone (DESYREL) 50 MG tablet, Take 50 mg by mouth at bedtime., Disp: , Rfl:   No Known Allergies   ROS  As noted in HPI.   Physical Exam  BP 129/60 mmHg  Pulse 61  Temp(Src) 97.9 F (36.6 C) (Oral)  Resp 16  Ht 5\' 6"  (1.676 m)  Wt 140 lb (63.504 kg)  BMI 22.61 kg/m2  SpO2 96%  Constitutional: Well developed, well nourished, no acute distress Eyes:  PERRLA EOMI, conjunctiva normal bilaterally HENT: Normocephalic,,mucus membranes moist. 4 centimeter laceration occipital scalp. No visualized skull fracture. No palpable bony defects. No other facial tenderness. No trismus. No dental trauma. No nasal bridge tenderness Spine: No C-spine, T-spine, L-spine tenderness. Respiratory: Normal inspiratory effort Cardiovascular: Normal rate regular rhythm, no chest wall tenderness GI: nondistended, soft and nontender skin: No rash, skin intact Musculoskeletal: no deformities pelvis stable. No extremity injury. Neurologic: Alert & oriented x 3, no focal neuro deficits. Patient able to ambulate independently. Psychiatric: Speech and behavior appropriate   ED Course   Medications  Tdap (BOOSTRIX) injection 0.5 mL (0.5 mLs Intramuscular Given 01/23/16 1245)    Orders Placed This Encounter  Procedures  . CT Head Wo Contrast    Standing Status: Standing     Number  of Occurrences: 1     Standing Expiration Date:     Order Specific Question:  Symptom/Reason for Exam    Answer:  Head injury due to trauma [[id number]]    No results found for this or any previous visit (from the past 24 hour(s)). Ct Head Wo Contrast  01/23/2016  CLINICAL DATA:  Larey Seat today and hit back of head.  Scalp laceration. EXAM: CT HEAD WITHOUT CONTRAST TECHNIQUE: Contiguous axial images were obtained from the base of the skull through the vertex without intravenous  contrast. COMPARISON:  Head CT and MRI brain from 2014. FINDINGS: The ventricles are normal in size and configuration. No extra-axial fluid collections are identified. The gray-white differentiation is normal. No CT findings for acute intracranial process such as hemorrhage or infarction. No mass lesions. The brainstem and cerebellum are grossly normal. The bony structures are intact. No acute skull fracture. No scalp hematoma or radiopaque foreign body. Skin staples are noted in the occipital region. The paranasal sinuses and mastoid air cells are clear. The globes are intact. IMPRESSION: No acute intracranial findings or skull fracture. Electronically Signed   By: Rudie Meyer M.D.   On: 01/23/2016 14:25   *ED Clinical Impression  Scalp laceration, initial encounter  Head injury due to trauma - Plan: CT Head Wo Contrast, CT Head Wo Contrast  Fall, initial encounter   ED Assessment/Plan Updated tetanus.  Procedure note: Irrigated wound thoroughly with wound cleanser. Cleaned area with chlorhexidine. Anesthetized area with 5 cc lidocaine 2% with epi. Wound was explored and visualized with complete hemostasis, see physical exam.  Using sterile technique, placed 7 staples along laceration with close approximation. Patient tolerated procedure well.  Reviewed imaging. No skull fracture, intracranial pathology. See radiology report for full details.  Patient is ambulatory, she has no neurologic findings. No evidence of concussion. Head CT is normal. Patient to do local wound care, follow-up with primary care physician, return here in 10 days for staple removal. To the ER for signs of infection , headache, neurologic changes, or other concerns.  Discussed imaging, MDM, plan and followup with patient. Discussed sn/sx that should prompt return to the ED. Patient agrees with plan.   *This clinic note was created using Dragon dictation software. Therefore, there may be occasional mistakes despite  careful proofreading.  ?    Domenick Gong, MD 01/23/16 [phone]

## 2016-01-23 NOTE — ED Notes (Signed)
[facility] needed.

## 2016-02-03 ENCOUNTER — Ambulatory Visit
Admission: EM | Admit: 2016-02-03 | Discharge: 2016-02-03 | Disposition: A | Discharge status: Home / Self Care | Payer: Medicare Other

## 2016-02-03 ENCOUNTER — Encounter: Payer: Self-pay | Admitting: Gynecology

## 2016-02-03 NOTE — ED Notes (Signed)
Staples removal x 7  from back of forehead on 01/23/2016

## 2016-11-18 ENCOUNTER — Encounter: Payer: Self-pay | Admitting: Emergency Medicine

## 2016-11-18 ENCOUNTER — Ambulatory Visit (INDEPENDENT_AMBULATORY_CARE_PROVIDER_SITE_OTHER): Payer: Medicare Other

## 2016-11-18 ENCOUNTER — Ambulatory Visit
Admission: EM | Admit: 2016-11-18 | Discharge: 2016-11-18 | Disposition: A | Discharge status: Home / Self Care | Payer: Medicare Other | Attending: Family Medicine | Admitting: Family Medicine

## 2016-11-18 DIAGNOSIS — S62667A Nondisplaced fracture of distal phalanx of left little finger, initial encounter for closed fracture: Secondary | ICD-10-CM

## 2016-11-18 DIAGNOSIS — S61217A Laceration without foreign body of left little finger without damage to nail, initial encounter: Secondary | ICD-10-CM | POA: Diagnosis not present

## 2016-11-18 MED ORDER — LIDOCAINE-EPINEPHRINE-TETRACAINE (LET) SOLUTION
3.0000 mL | Freq: Once | NASAL | Status: AC
  Administered 2016-11-18: 3 mL via TOPICAL

## 2016-11-18 MED ORDER — AMOXICILLIN-POT CLAVULANATE 875-125 MG PO TABS: 1.0000 | ORAL_TABLET | Freq: Two times a day (BID) | ORAL | 0 refills | Status: DC

## 2016-11-18 NOTE — Discharge Instructions (Signed)
Follow up with hand orthopedist tomorrow

## 2016-11-18 NOTE — ED Provider Notes (Signed)
[facility]    CSN: [id number] Arrival date & time: 11/18/16  1536     History   Chief Complaint Chief Complaint  Patient presents with  . Laceration    HPI Jenna Powers is a 76 y.o. female.   76 yo female presents with a c/o laceration, swelling, and bruising to her left little finger after injuring it yesterday. States she dropped a piece of heavy glass that landed on her finger. This occurred yesterday around 3pm (over 24 hours ago). Patient denies any drainage, fevers, chills. Patient is up to date on her tetanus immunization.   The history is provided by the patient.    Past Medical History:  Diagnosis Date  . Depression   . Hyperlipemia   . Hypertension   . Neuropathy (HCC)   . Thyroid disease   . Tremor     There are no active problems to display for this patient.   Past Surgical History:  Procedure Laterality Date  . ABDOMINAL HYSTERECTOMY    . ANKLE SURGERY Right     OB History    No data available       Home Medications    Prior to Admission medications   Medication Sig Start Date End Date Taking? Authorizing Provider  amoxicillin-clavulanate (AUGMENTIN) 875-125 MG tablet Take 1 tablet by mouth 2 (two) times daily. 11/18/16   Payton Mccallum, MD  aspirin EC 81 MG tablet Take 81 mg by mouth daily.    Historical Provider, MD  clopidogrel (PLAVIX) 300 MG TABS tablet Take 300 mg by mouth once.    Historical Provider, MD  FLUoxetine (PROZAC) 40 MG capsule Take 40 mg by mouth daily.    Historical Provider, MD  losartan-hydrochlorothiazide (HYZAAR) 50-12.5 MG tablet Take 1 tablet by mouth daily.    Historical Provider, MD  oxybutynin (DITROPAN) 5 MG tablet Take 5 mg by mouth 2 (two) times daily.     Historical Provider, MD  traMADol (ULTRAM) 50 MG tablet Take 1 tablet (50 mg total) by mouth 2 (two) times daily. 08/08/15   Renford Dills, NP  traZODone (DESYREL) 50 MG tablet Take 50 mg by mouth at bedtime.    Historical Provider, MD     Family History History reviewed. No pertinent family history.  Social History Social History  Substance Use Topics  . Smoking status: Current Some Day Smoker  . Smokeless tobacco: Never Used  . Alcohol use No     Allergies   Patient has no known allergies.   Review of Systems Review of Systems   Physical Exam Triage Vital Signs ED Triage Vitals  Enc Vitals Group     BP 11/18/16 1600 125/84     Pulse Rate 11/18/16 1600 64     Resp 11/18/16 1600 16     Temp 11/18/16 1600 98 F (36.7 C)     Temp Source 11/18/16 1600 Oral     SpO2 11/18/16 1600 99 %     Weight 11/18/16 1558 140 lb (63.5 kg)     Height 11/18/16 1558  (1.676 m)     Head Circumference --      Peak Flow --      Pain Score 11/18/16 1558 0     Pain Loc --      Pain Edu? --      Excl. in GC? --    No data found.   Updated Vital Signs BP 125/84 (BP Location: Left Arm)   Pulse 64   Temp  98 F (36.7 C) (Oral)   Resp 16   Ht  (1.676 m)   Wt 140 lb (63.5 kg)   SpO2 99%   BMI 22.60 kg/m   Visual Acuity Right Eye Distance:   Left Eye Distance:   Bilateral Distance:    Right Eye Near:   Left Eye Near:    Bilateral Near:     Physical Exam  Constitutional: She appears well-developed and well-nourished. No distress.  Musculoskeletal:       Left hand: She exhibits tenderness, bony tenderness, laceration (2 separate 1cm superficial lacerations to distal end of left 5th (little) finger; no nail involvement; bleeding controlled) and swelling. She exhibits normal range of motion, normal two-point discrimination, normal capillary refill and no deformity. Normal sensation noted. Normal strength noted.  Skin: She is not diaphoretic.  Nursing note and vitals reviewed.    UC Treatments / Results  Labs (all labs ordered are listed, but only abnormal results are displayed) Labs Reviewed - No data to display  EKG  EKG Interpretation None       Radiology Dg Finger Little  Left  Result Date: 11/18/2016 CLINICAL DATA:  Initial evaluation for acute injury, laceration to distal phalanx. EXAM: LEFT LITTLE FINGER 2+V COMPARISON:  None. FINDINGS: No acute fracture or dislocation. Soft tissue swelling with irregularity at the distal aspect of the left fifth digit, likely related to laceration. No radiopaque foreign body. Degenerative osteoarthritic changes present about the visualized joints of the hand. Osteopenia. IMPRESSION: 1. Soft tissue swelling with irregularity at the distal aspect of the left fifth digit, compatible with laceration/injury. No radiopaque foreign body. 2. No acute fracture or dislocation. 3. Degenerative osteoarthrosis. 4. Osteopenia. Electronically Signed   By: Rise Mu M.D.   On: 11/18/2016 16:31    Procedures Procedures (including critical care time)  Medications Ordered in UC Medications  lidocaine-EPINEPHrine-tetracaine (LET) solution (3 mLs Topical Given 11/18/16 1710)     Initial Impression / Assessment and Plan / UC Course  I have reviewed the triage vital signs and the nursing notes.  Pertinent labs & imaging results that were available during my care of the patient were reviewed by me and considered in my medical decision making (see chart for details).       Final Clinical Impressions(s) / UC Diagnoses   Final diagnoses:  Laceration of left little finger without foreign body without damage to nail, initial encounter  Closed nondisplaced fracture of distal phalanx of left little finger, initial encounter    New Prescriptions Discharge Medication List as of 11/18/2016  5:07 PM    START taking these medications   Details  amoxicillin-clavulanate (AUGMENTIN) 875-125 MG tablet Take 1 tablet by mouth 2 (two) times daily., Starting Sun 11/18/2016, Normal       1. x-ray results and diagnosis reviewed with patient 2. rx as per orders above; reviewed possible side effects, interactions, risks and benefits 3. Wound  cleaned, no foreign bodies visualized and steri strips applied to approximate wound edges; wound area bandaged 4. Finger splint applied for immobilization  5. Follow-up with orthopedic hand specialist in next 1-2 days   6. Follow up here prn   Payton Mccallum, MD 11/18/16 [phone]

## 2016-11-18 NOTE — ED Triage Notes (Signed)
Patient c/o laceration to her left 5th finger and right 3rd finger.  Patient cut her left 5th finger on a piece of glass yesterday around 3:00pm.

## 2016-11-19 ENCOUNTER — Telehealth: Payer: Self-pay

## 2016-11-19 NOTE — Telephone Encounter (Signed)
Spoke to patient and gave her phone number for [facility]. She didn't feel comfortable following up with Dr Hyacinth Meeker.

## 2016-11-20 ENCOUNTER — Emergency Department
Admission: EM | Admit: 2016-11-20 | Discharge: 2016-11-20 | Disposition: A | Discharge status: Home / Self Care | Payer: Medicare Other | Attending: Emergency Medicine | Admitting: Emergency Medicine

## 2016-11-20 ENCOUNTER — Encounter: Payer: Self-pay | Admitting: Emergency Medicine

## 2016-11-20 DIAGNOSIS — Z7982 Long term (current) use of aspirin: Secondary | ICD-10-CM | POA: Diagnosis not present

## 2016-11-20 DIAGNOSIS — Z79899 Other long term (current) drug therapy: Secondary | ICD-10-CM | POA: Diagnosis not present

## 2016-11-20 DIAGNOSIS — I10 Essential (primary) hypertension: Secondary | ICD-10-CM | POA: Diagnosis not present

## 2016-11-20 DIAGNOSIS — Z4800 Encounter for change or removal of nonsurgical wound dressing: Secondary | ICD-10-CM | POA: Insufficient documentation

## 2016-11-20 DIAGNOSIS — F17200 Nicotine dependence, unspecified, uncomplicated: Secondary | ICD-10-CM | POA: Diagnosis not present

## 2016-11-20 DIAGNOSIS — Z5189 Encounter for other specified aftercare: Secondary | ICD-10-CM

## 2016-11-20 NOTE — ED Notes (Signed)
Pt discharged home after verbalizing understanding of discharge instructions; nad noted. 

## 2016-11-20 NOTE — ED Triage Notes (Signed)
States she is here for wound check of left 5th finger  States she cut it on glass on Saturday night  Went to [facility] and was told she could have it sutured  Placed on antibitoics

## 2016-11-20 NOTE — Discharge Instructions (Signed)
Please keep your scheduled appointment with orthopedics.  Follow up with your primary care provider for concerns if you are unable to see the orthopedist.  Continue the antibiotic as prescribed.  Return to the ER for symptoms of concern if unable to see primary care or the orthopedist.

## 2016-11-20 NOTE — ED Provider Notes (Signed)
[facility] Emergency Department Provider Note  ____________________________________________  Time seen: Approximately 3:04 PM  I have reviewed the triage vital signs and the nursing notes.   HISTORY  Chief Complaint No chief complaint on file.  HPI Jenna Powers is a 76 y.o. female who presents to the emergency department for a wound check. She was evaluated on 11/18/2016 for a laceration to her left pinky finger that she had sustained the day before presenting to urgent care. She states that the bandage got wet, so she removed the bandage that was applied and the area began to bleed. She has a follow up appointment scheduled with orthopedics for next week. No new injury today. No increase in pain.   Past Medical History:  Diagnosis Date  . Depression   . Hyperlipemia   . Hypertension   . Neuropathy (HCC)   . Thyroid disease   . Tremor     There are no active problems to display for this patient.   Past Surgical History:  Procedure Laterality Date  . ABDOMINAL HYSTERECTOMY    . ANKLE SURGERY Right     Prior to Admission medications   Medication Sig Start Date End Date Taking? Authorizing Provider  amoxicillin-clavulanate (AUGMENTIN) 875-125 MG tablet Take 1 tablet by mouth 2 (two) times daily. 11/18/16   Payton Mccallum, MD  aspirin EC 81 MG tablet Take 81 mg by mouth daily.    Historical Provider, MD  clopidogrel (PLAVIX) 300 MG TABS tablet Take 300 mg by mouth once.    Historical Provider, MD  FLUoxetine (PROZAC) 40 MG capsule Take 40 mg by mouth daily.    Historical Provider, MD  losartan-hydrochlorothiazide (HYZAAR) 50-12.5 MG tablet Take 1 tablet by mouth daily.    Historical Provider, MD  oxybutynin (DITROPAN) 5 MG tablet Take 5 mg by mouth 2 (two) times daily.     Historical Provider, MD  traMADol (ULTRAM) 50 MG tablet Take 1 tablet (50 mg total) by mouth 2 (two) times daily. 08/08/15   Renford Dills, NP  traZODone (DESYREL) 50 MG tablet Take  50 mg by mouth at bedtime.    Historical Provider, MD    Allergies Patient has no known allergies.  No family history on file.  Social History Social History  Substance Use Topics  . Smoking status: Current Some Day Smoker  . Smokeless tobacco: Never Used  . Alcohol use No    Review of Systems Constitutional: No fever/chills Musculoskeletal: Positive for pain in the left 5th digit. Skin: Positive for healing wound to the left 5th digit. Neurological: Negative for focal weakness or numbness. ____________________________________________   PHYSICAL EXAM:  VITAL SIGNS: ED Triage Vitals  Enc Vitals Group     BP 11/20/16 1459 131/63     Pulse Rate 11/20/16 1459 69     Resp 11/20/16 1459 18     Temp 11/20/16 1459 98 F (36.7 C)     Temp Source 11/20/16 1459 Oral     SpO2 11/20/16 1459 99 %     Weight 11/20/16 1500 140 lb (63.5 kg)     Height 11/20/16 1500  (1.676 m)     Head Circumference --      Peak Flow --      Pain Score --      Pain Loc --      Pain Edu? --      Excl. in GC? --     Constitutional: Alert and oriented. Well appearing and in no  acute distress. Eyes: Conjunctivae are normal. EOMI. Cardiovascular:Good peripheral circulation. Respiratory: Normal respiratory effort.  No retractions.  Musculoskeletal: Full ROM throughout. Skin: Healing wound to the left 5th digit of the hand with scant amount of active bleeding on the finger pad. Digit is mildly edematous without evidence of wound infection or cellulitis.   ____________________________________________   LABS (all labs ordered are listed, but only abnormal results are displayed)  Labs Reviewed - No data to display ____________________________________________  RADIOLOGY  Not indicated ____________________________________________   PROCEDURES  Procedure(s) performed: Wound care and aluminum foam splint.  ___________________________________________   INITIAL IMPRESSION / ASSESSMENT AND  PLAN / ED COURSE  Pertinent labs & imaging results that were available during my care of the patient were reviewed by me and considered in my medical decision making (see chart for details).  Patient was advised to follow up with the primary care provider for symptoms of concern or return to the emergency department if unable to schedule an appointment. She is to keep her scheduled appointment with orthopedics next week and continue the antibiotic. ____________________________________________   FINAL CLINICAL IMPRESSION(S) / ED DIAGNOSES  Final diagnoses:  Encounter for wound re-check  Encounter for wound care    Note:  This document was prepared using Dragon voice recognition software and may include unintentional dictation errors.     Chinita Pester, FNP 11/20/16 1533    Myrna Blazer, MD 11/21/16 [phone]

## 2017-12-11 ENCOUNTER — Other Ambulatory Visit: Payer: Self-pay | Admitting: Orthopedic Surgery

## 2017-12-11 DIAGNOSIS — M5136 Other intervertebral disc degeneration, lumbar region: Secondary | ICD-10-CM

## 2017-12-18 ENCOUNTER — Ambulatory Visit: Admission: RE | Admit: 2017-12-18 | Payer: Medicare HMO | Source: Ambulatory Visit

## 2017-12-31 ENCOUNTER — Ambulatory Visit: Payer: Medicare HMO

## 2018-01-28 ENCOUNTER — Encounter (INDEPENDENT_AMBULATORY_CARE_PROVIDER_SITE_OTHER): Payer: Self-pay

## 2018-01-28 ENCOUNTER — Ambulatory Visit
Admission: RE | Admit: 2018-01-28 | Discharge: 2018-01-28 | Disposition: A | Discharge status: Home / Self Care | Payer: Medicare HMO | Source: Ambulatory Visit | Attending: Orthopedic Surgery | Admitting: Orthopedic Surgery

## 2018-01-28 DIAGNOSIS — M545 Low back pain: Secondary | ICD-10-CM | POA: Diagnosis present

## 2018-01-28 DIAGNOSIS — N289 Disorder of kidney and ureter, unspecified: Secondary | ICD-10-CM | POA: Insufficient documentation

## 2018-01-28 DIAGNOSIS — M5136 Other intervertebral disc degeneration, lumbar region: Secondary | ICD-10-CM | POA: Insufficient documentation

## 2018-01-28 DIAGNOSIS — M48061 Spinal stenosis, lumbar region without neurogenic claudication: Secondary | ICD-10-CM | POA: Insufficient documentation

## 2018-01-28 DIAGNOSIS — M5127 Other intervertebral disc displacement, lumbosacral region: Secondary | ICD-10-CM | POA: Insufficient documentation

## 2018-02-25 ENCOUNTER — Ambulatory Visit
Admission: RE | Admit: 2018-02-25 | Discharge: 2018-02-25 | Disposition: A | Discharge status: Home / Self Care | Payer: Medicare HMO | Source: Ambulatory Visit | Attending: Family Medicine | Admitting: Family Medicine

## 2018-02-25 ENCOUNTER — Other Ambulatory Visit: Payer: Self-pay | Admitting: Family Medicine

## 2018-02-25 DIAGNOSIS — R05 Cough: Secondary | ICD-10-CM

## 2018-02-25 DIAGNOSIS — R918 Other nonspecific abnormal finding of lung field: Secondary | ICD-10-CM | POA: Insufficient documentation

## 2018-02-25 DIAGNOSIS — R059 Cough, unspecified: Secondary | ICD-10-CM

## 2018-02-25 DIAGNOSIS — I700 Atherosclerosis of aorta: Secondary | ICD-10-CM | POA: Diagnosis not present

## 2018-08-09 ENCOUNTER — Other Ambulatory Visit: Payer: Self-pay

## 2018-08-09 ENCOUNTER — Ambulatory Visit
Admission: EM | Admit: 2018-08-09 | Discharge: 2018-08-09 | Disposition: A | Discharge status: Home / Self Care | Payer: Medicare HMO | Attending: Family Medicine | Admitting: Family Medicine

## 2018-08-09 DIAGNOSIS — M79671 Pain in right foot: Secondary | ICD-10-CM | POA: Diagnosis not present

## 2018-08-09 MED ORDER — METHYLPREDNISOLONE SODIUM SUCC 40 MG IJ SOLR
80.0000 mg | Freq: Once | INTRAMUSCULAR | Status: AC
  Administered 2018-08-09: 80 mg via INTRAMUSCULAR

## 2018-08-09 NOTE — Discharge Instructions (Signed)
Continue your home medications.  See podiatry if persists.  Take care  Dr. Adriana Simasook

## 2018-08-09 NOTE — ED Triage Notes (Addendum)
Pt reports right foot pain in absence of fall. Taking all of her pain meds without relief. Pain started today. States her PCP has had to inject her foot before for the same thing.

## 2018-08-10 NOTE — ED Provider Notes (Signed)
[facility]    CSN: [id number] Arrival date & time: 08/09/18  1216  History   Chief Complaint Chief Complaint  Patient presents with  . Foot Pain   HPI  77 year old female with chronic pain presents with pain of her right foot.  Started abruptly this morning.  No fall, trauma, injury.  Pain is located just below the MTP region of her right foot.  She states that her pain is severe.  Difficulty for her to ambulate.  Patient states that she has had an injection in her foot previously.  Patient is unsure of the cause of her pain.  She states that her home pain medications are not improving her pain.  No other associated symptoms.  No other complaints.  History reviewed as below. Past Medical History:  Diagnosis Date  . Depression   . Hyperlipemia   . Hypertension   . Neuropathy   . Thyroid disease   . Tremor    Past Surgical History:  Procedure Laterality Date  . ABDOMINAL HYSTERECTOMY    . ANKLE SURGERY Right    OB History   No obstetric history on file.    Home Medications    Prior to Admission medications   Medication Sig Start Date End Date Taking? Authorizing Provider  clopidogrel (PLAVIX) 300 MG TABS tablet Take 300 mg by mouth once.    [provider]  FLUoxetine (PROZAC) 40 MG capsule Take 40 mg by mouth daily.    [provider]  HYDROcodone-acetaminophen (NORCO/VICODIN) 5-325 MG tablet hydrocodone 5 mg-acetaminophen 325 mg tablet    [provider]  losartan-hydrochlorothiazide (HYZAAR) 50-12.5 MG tablet Take 1 tablet by mouth daily.    [provider]  oxybutynin (DITROPAN) 5 MG tablet Take 5 mg by mouth 2 (two) times daily.     [provider]  traMADol (ULTRAM) 50 MG tablet Take 1 tablet (50 mg total) by mouth 2 (two) times daily. 08/08/15   Renford DillsMiller, Lindsey, NP  traZODone (DESYREL) 50 MG tablet Take 50 mg by mouth at bedtime.    [provider]   Social History Social History   Tobacco Use   . Smoking status: Current Some Day Smoker    Packs/day: 0.15    Types: Cigarettes  . Smokeless tobacco: Never Used  Substance Use Topics  . Alcohol use: No  . Drug use: No     Allergies   Patient has no known allergies.   Review of Systems Review of Systems  Constitutional: Negative.   Musculoskeletal:       Foot pain.   Physical Exam Triage Vital Signs ED Triage Vitals  Enc Vitals Group     BP 08/09/18 1242 107/68     Pulse Rate 08/09/18 1242 68     Resp 08/09/18 1242 16     Temp 08/09/18 1241 97.8 F (36.6 C)     Temp Source 08/09/18 1241 Oral     SpO2 08/09/18 1242 98 %     Weight 08/09/18 1242 137 lb (62.1 kg)     Height 08/09/18 1242 5\' 6"  (1.676 m)     Head Circumference --      Peak Flow --      Pain Score 08/09/18 1242 10     Pain Loc --      Pain Edu? --      Excl. in GC? --    Updated Vital Signs BP 107/68 (BP Location: Right Arm)   Pulse 68   Temp 97.8  F (36.6 C) (Oral)   Resp 16   Ht 5\' 6"  (1.676 m)   Wt 62.1 kg   SpO2 98%   BMI 22.11 kg/m   Visual Acuity Right Eye Distance:   Left Eye Distance:   Bilateral Distance:    Right Eye Near:   Left Eye Near:    Bilateral Near:     Physical Exam Vitals signs and nursing note reviewed.  Constitutional:      Appearance: Normal appearance.  HENT:     Head: Normocephalic and atraumatic.  Eyes:     General: No scleral icterus.    Conjunctiva/sclera: Conjunctivae normal.  Cardiovascular:     Rate and Rhythm: Normal rate and regular rhythm.  Pulmonary:     Effort: Pulmonary effort is normal. No respiratory distress.  Musculoskeletal:     Comments: Right foot -no visible abnormalities.  Patient has tenderness to palpation of the midfoot just proximal to the MTP joints.  Of note, when patient is distracted and areas palpated, she does not appear to have any pain  Neurological:     Mental Status: She is alert.  Psychiatric:        Behavior: Behavior normal.     Comments: Flat affect.     UC Treatments / Results  Labs (all labs ordered are listed, but only abnormal results are displayed) Labs Reviewed - No data to display  EKG None  Radiology No results found.  Procedures Procedures (including critical care time)  Medications Ordered in UC Medications  methylPREDNISolone sodium succinate (SOLU-MEDROL) 40 mg/mL injection 80 mg (80 mg Intramuscular Given 08/09/18 1359)    Initial Impression / Assessment and Plan / UC Course  I have reviewed the triage vital signs and the nursing notes.  Pertinent labs & imaging results that were available during my care of the patient were reviewed by me and considered in my medical decision making (see chart for details).    77 year old female presents with right foot pain.  Etiology unclear.  Patient desired injection.  I informed her that we can give her an injection in her buttock but that I do not see the need to inject her foot (and I do not do so).  Medrol given.  Follow-up with podiatry.  Final Clinical Impressions(s) / UC Diagnoses   Final diagnoses:  Right foot pain     Discharge Instructions     Continue your home medications.  See podiatry if persists.  Take care  Dr. Adriana Simasook    ED Prescriptions    None     Controlled Substance Prescriptions [place] Controlled Substance Registry consulted? Not Applicable   Tommie SamsCook, Trumaine Wimer G, DO 08/10/18 0820

## 2018-08-14 ENCOUNTER — Encounter: Payer: Self-pay | Admitting: Emergency Medicine

## 2018-08-14 ENCOUNTER — Ambulatory Visit (INDEPENDENT_AMBULATORY_CARE_PROVIDER_SITE_OTHER): Payer: Medicare HMO

## 2018-08-14 ENCOUNTER — Other Ambulatory Visit: Payer: Self-pay

## 2018-08-14 ENCOUNTER — Ambulatory Visit
Admission: EM | Admit: 2018-08-14 | Discharge: 2018-08-14 | Disposition: A | Discharge status: Home / Self Care | Payer: Medicare HMO | Attending: Family Medicine | Admitting: Family Medicine

## 2018-08-14 DIAGNOSIS — R05 Cough: Secondary | ICD-10-CM | POA: Diagnosis not present

## 2018-08-14 DIAGNOSIS — R0989 Other specified symptoms and signs involving the circulatory and respiratory systems: Secondary | ICD-10-CM | POA: Diagnosis not present

## 2018-08-14 DIAGNOSIS — J069 Acute upper respiratory infection, unspecified: Secondary | ICD-10-CM | POA: Diagnosis not present

## 2018-08-14 MED ORDER — BENZONATATE 200 MG PO CAPS: ORAL_CAPSULE | ORAL | 0 refills | Status: DC

## 2018-08-14 MED ORDER — DOXYCYCLINE HYCLATE 100 MG PO CAPS: 100.0000 mg | ORAL_CAPSULE | Freq: Two times a day (BID) | ORAL | 0 refills | Status: DC

## 2018-08-14 MED ORDER — ALBUTEROL SULFATE HFA 108 (90 BASE) MCG/ACT IN AERS: 1.0000 | INHALATION_SPRAY | Freq: Four times a day (QID) | RESPIRATORY_TRACT | 0 refills | Status: DC | PRN

## 2018-08-14 MED ORDER — IPRATROPIUM-ALBUTEROL 0.5-2.5 (3) MG/3ML IN SOLN
3.0000 mL | Freq: Once | RESPIRATORY_TRACT | Status: AC
  Administered 2018-08-14: 3 mL via RESPIRATORY_TRACT

## 2018-08-14 NOTE — Discharge Instructions (Signed)
If you run fever have shortness of breath or not improving go to the emergency room.  Otherwise follow-up with Dr. Quillian QuinceBliss next week

## 2018-08-14 NOTE — ED Triage Notes (Signed)
Pt c/o cough, and chest congestion. Started about 5 days ago.

## 2018-08-14 NOTE — ED Provider Notes (Signed)
[facility]    CSN: [id number] Arrival date & time: 08/14/18  1339     History   Chief Complaint Chief Complaint  Patient presents with  . Cough    HPI Jenna Powers is a 77 y.o. female.   HPI  -year-old female presents with a cough and chest congestion is started about 5 days ago.  States that she feels terrible.  Not been able to eat for the last couple of days.  Cough is very hoarse sounding is productive of thick sputum.  She has not had fever or chills.  Today she is afebrile at 98.4 pulse rate of 88 blood pressure 106/48 respirations seen O2 sats on room air 92%       Past Medical History:  Diagnosis Date  . Depression   . Hyperlipemia   . Hypertension   . Neuropathy   . Thyroid disease   . Tremor     There are no active problems to display for this patient.   Past Surgical History:  Procedure Laterality Date  . ABDOMINAL HYSTERECTOMY    . ANKLE SURGERY Right     OB History   No obstetric history on file.      Home Medications    Prior to Admission medications   Medication Sig Start Date End Date Taking? Authorizing Provider  clopidogrel (PLAVIX) 300 MG TABS tablet Take 300 mg by mouth once.   Yes [provider]  FLUoxetine (PROZAC) 40 MG capsule Take 40 mg by mouth daily.   Yes [provider]  HYDROcodone-acetaminophen (NORCO/VICODIN) 5-325 MG tablet hydrocodone 5 mg-acetaminophen 325 mg tablet   Yes [provider]  losartan-hydrochlorothiazide (HYZAAR) 50-12.5 MG tablet Take 1 tablet by mouth daily.   Yes [provider]  oxybutynin (DITROPAN) 5 MG tablet Take 5 mg by mouth 2 (two) times daily.    Yes [provider]  traMADol (ULTRAM) 50 MG tablet Take 1 tablet (50 mg total) by mouth 2 (two) times daily. 08/08/15  Yes Renford DillsMiller, Lindsey, NP  traZODone (DESYREL) 50 MG tablet Take 50 mg by mouth at bedtime.   Yes [provider]  albuterol (PROVENTIL HFA;VENTOLIN HFA) 108 (90  Base) MCG/ACT inhaler Inhale 1-2 puffs into the lungs every 6 (six) hours as needed for wheezing or shortness of breath. Use with spacer 08/14/18   Lutricia Feiloemer, Deneane Stifter P, PA-C  benzonatate (TESSALON) 200 MG capsule Take one cap TID PRN cough 08/14/18   Lutricia Feiloemer, Hill Mackie P, PA-C  doxycycline (VIBRAMYCIN) 100 MG capsule Take 1 capsule (100 mg total) by mouth 2 (two) times daily. 08/14/18   Lutricia Feiloemer, Jonnelle Lawniczak P, PA-C    Family History History reviewed. No pertinent family history.  Social History Social History   Tobacco Use  . Smoking status: Current Some Day Smoker    Packs/day: 0.15    Types: Cigarettes  . Smokeless tobacco: Never Used  Substance Use Topics  . Alcohol use: Yes  . Drug use: No     Allergies   Patient has no known allergies.   Review of Systems Review of Systems  Constitutional: Positive for activity change, appetite change and fatigue.  HENT: Positive for congestion.   Respiratory: Positive for cough and shortness of breath.   All other systems reviewed and are negative.    Physical Exam Triage Vital Signs ED Triage Vitals  Enc Vitals Group     BP 08/14/18 1359 (!) 106/48     Pulse Rate 08/14/18 1359 88  Resp 08/14/18 1359 18     Temp 08/14/18 1359 98.4 F (36.9 C)     Temp Source 08/14/18 1359 Oral     SpO2 08/14/18 1359 92 %     Weight 08/14/18 1356 137 lb (62.1 kg)     Height 08/14/18 1356 5\' 6"  (1.676 m)     Head Circumference --      Peak Flow --      Pain Score 08/14/18 1356 0     Pain Loc --      Pain Edu? --      Excl. in GC? --    No data found.  Updated Vital Signs BP (!) 106/48 (BP Location: Left Arm)   Pulse 88   Temp 98.4 F (36.9 C) (Oral)   Resp 18   Ht 5\' 6"  (1.676 m)   Wt 137 lb (62.1 kg)   SpO2 94%   BMI 22.11 kg/m   Visual Acuity Right Eye Distance:   Left Eye Distance:   Bilateral Distance:    Right Eye Near:   Left Eye Near:    Bilateral Near:     Physical Exam Vitals signs and nursing note reviewed.    Constitutional:      General: She is not in acute distress.    Appearance: Normal appearance. She is normal weight. She is ill-appearing. She is not toxic-appearing or diaphoretic.  HENT:     Head: Normocephalic.     Right Ear: Tympanic membrane, ear canal and external ear normal.     Left Ear: Tympanic membrane, ear canal and external ear normal.     Nose: Nose normal.     Mouth/Throat:     Mouth: Mucous membranes are moist.     Pharynx: No oropharyngeal exudate or posterior oropharyngeal erythema.  Eyes:     General:        Right eye: No discharge.        Left eye: No discharge.     Conjunctiva/sclera: Conjunctivae normal.  Neck:     Musculoskeletal: Normal range of motion.  Pulmonary:     Effort: Pulmonary effort is normal.     Breath sounds: Rhonchi and rales present.  Musculoskeletal: Normal range of motion.  Lymphadenopathy:     Cervical: No cervical adenopathy.  Skin:    General: Skin is warm and dry.  Neurological:     General: No focal deficit present.     Mental Status: She is alert and oriented to person, place, and time.  Psychiatric:        Mood and Affect: Mood normal.        Behavior: Behavior normal.        Thought Content: Thought content normal.        Judgment: Judgment normal.      UC Treatments / Results  Labs (all labs ordered are listed, but only abnormal results are displayed) Labs Reviewed - No data to display  EKG None  Radiology Dg Chest 2 View  Result Date: 08/14/2018 CLINICAL DATA:  Cough and chest congestion for 5 days. EXAM: CHEST - 2 VIEW COMPARISON:  02/25/2018 FINDINGS: The cardiac silhouette, mediastinal and hilar contours are within normal limits and stable. Stable tortuosity and calcification of the thoracic aorta. The lungs are clear. No infiltrates, edema or effusions. No worrisome pulmonary lesions. The bony thorax is intact. IMPRESSION: No acute cardiopulmonary findings. Electronically Signed   By: Rudie MeyerP.  Gallerani M.D.   On:  08/14/2018 15:28    Procedures  Procedures (including critical care time)  Medications Ordered in UC Medications  ipratropium-albuterol (DUONEB) 0.5-2.5 (3) MG/3ML nebulizer solution 3 mL (3 mLs Nebulization Given 08/14/18 1513)   Felt improved with increased O2 sat to 94% after the DuoNeb treatment. Initial Impression / Assessment and Plan / UC Course  I have reviewed the triage vital signs and the nursing notes.  Pertinent labs & imaging results that were available during my care of the patient were reviewed by me and considered in my medical decision making (see chart for details).   Reviewed the x-ray with the patient which showed no acute pulmonary findings.  Upper respiratory infection.  I think that she would benefit from a short course of doxycycline.  We will provide her an albuterol inhaler and Tessalon Perles.  I warned her that if she has is high fever or is not improving or worsen she should go to the emergency room for further eval   Final Clinical Impressions(s) / UC Diagnoses   Final diagnoses:  Upper respiratory tract infection, unspecified type     Discharge Instructions     If you run fever have shortness of breath or not improving go to the emergency room.  Otherwise follow-up with Dr. Quillian Quince next week    ED Prescriptions    Medication Sig Dispense Auth. Provider   doxycycline (VIBRAMYCIN) 100 MG capsule Take 1 capsule (100 mg total) by mouth 2 (two) times daily. 14 capsule Ovid Curd P, PA-C   albuterol (PROVENTIL HFA;VENTOLIN HFA) 108 (90 Base) MCG/ACT inhaler Inhale 1-2 puffs into the lungs every 6 (six) hours as needed for wheezing or shortness of breath. Use with spacer 1 Inhaler Lutricia Feil, PA-C   benzonatate (TESSALON) 200 MG capsule Take one cap TID PRN cough 30 capsule Lutricia Feil, PA-C     Controlled Substance Prescriptions [place] Controlled Substance Registry consulted? Not Applicable   Lutricia Feil, PA-C 08/14/18 9147

## 2018-12-23 DIAGNOSIS — F419 Anxiety disorder, unspecified: Secondary | ICD-10-CM | POA: Diagnosis present

## 2018-12-23 DIAGNOSIS — I10 Essential (primary) hypertension: Secondary | ICD-10-CM | POA: Diagnosis present

## 2018-12-23 DIAGNOSIS — Z8659 Personal history of other mental and behavioral disorders: Secondary | ICD-10-CM

## 2019-01-08 ENCOUNTER — Other Ambulatory Visit: Payer: Self-pay | Admitting: Family Medicine

## 2019-01-08 DIAGNOSIS — Z780 Asymptomatic menopausal state: Secondary | ICD-10-CM

## 2019-04-22 DIAGNOSIS — E038 Other specified hypothyroidism: Secondary | ICD-10-CM | POA: Diagnosis present

## 2019-09-10 ENCOUNTER — Emergency Department: Payer: Medicare Other

## 2019-09-10 ENCOUNTER — Observation Stay: Payer: Medicare Other

## 2019-09-10 ENCOUNTER — Other Ambulatory Visit: Payer: Self-pay

## 2019-09-10 ENCOUNTER — Encounter: Payer: Self-pay | Admitting: Emergency Medicine

## 2019-09-10 ENCOUNTER — Observation Stay
Admit: 2019-09-10 | Discharge: 2019-09-10 | Disposition: A | Discharge status: Home / Self Care | Payer: Medicare Other | Attending: Internal Medicine | Admitting: Internal Medicine

## 2019-09-10 ENCOUNTER — Observation Stay
Admission: EM | Admit: 2019-09-10 | Discharge: 2019-09-11 | Disposition: A | Discharge status: Home / Self Care | Payer: Medicare Other | Attending: Hospitalist | Admitting: Hospitalist

## 2019-09-10 DIAGNOSIS — F329 Major depressive disorder, single episode, unspecified: Secondary | ICD-10-CM | POA: Diagnosis not present

## 2019-09-10 DIAGNOSIS — Z9181 History of falling: Secondary | ICD-10-CM | POA: Insufficient documentation

## 2019-09-10 DIAGNOSIS — S01112A Laceration without foreign body of left eyelid and periocular area, initial encounter: Secondary | ICD-10-CM | POA: Insufficient documentation

## 2019-09-10 DIAGNOSIS — R55 Syncope and collapse: Principal | ICD-10-CM | POA: Diagnosis present

## 2019-09-10 DIAGNOSIS — F17210 Nicotine dependence, cigarettes, uncomplicated: Secondary | ICD-10-CM | POA: Diagnosis not present

## 2019-09-10 DIAGNOSIS — Z7902 Long term (current) use of antithrombotics/antiplatelets: Secondary | ICD-10-CM | POA: Insufficient documentation

## 2019-09-10 DIAGNOSIS — Z20822 Contact with and (suspected) exposure to covid-19: Secondary | ICD-10-CM | POA: Diagnosis not present

## 2019-09-10 DIAGNOSIS — Z79890 Hormone replacement therapy (postmenopausal): Secondary | ICD-10-CM | POA: Insufficient documentation

## 2019-09-10 DIAGNOSIS — E039 Hypothyroidism, unspecified: Secondary | ICD-10-CM | POA: Insufficient documentation

## 2019-09-10 DIAGNOSIS — W19XXXA Unspecified fall, initial encounter: Secondary | ICD-10-CM | POA: Insufficient documentation

## 2019-09-10 DIAGNOSIS — Z7982 Long term (current) use of aspirin: Secondary | ICD-10-CM | POA: Insufficient documentation

## 2019-09-10 DIAGNOSIS — M549 Dorsalgia, unspecified: Secondary | ICD-10-CM | POA: Insufficient documentation

## 2019-09-10 DIAGNOSIS — R7989 Other specified abnormal findings of blood chemistry: Secondary | ICD-10-CM | POA: Insufficient documentation

## 2019-09-10 DIAGNOSIS — S0181XA Laceration without foreign body of other part of head, initial encounter: Secondary | ICD-10-CM

## 2019-09-10 DIAGNOSIS — Z8673 Personal history of transient ischemic attack (TIA), and cerebral infarction without residual deficits: Secondary | ICD-10-CM | POA: Insufficient documentation

## 2019-09-10 DIAGNOSIS — G8929 Other chronic pain: Secondary | ICD-10-CM | POA: Diagnosis not present

## 2019-09-10 DIAGNOSIS — Z79899 Other long term (current) drug therapy: Secondary | ICD-10-CM | POA: Diagnosis not present

## 2019-09-10 DIAGNOSIS — I10 Essential (primary) hypertension: Secondary | ICD-10-CM | POA: Diagnosis not present

## 2019-09-10 DIAGNOSIS — E860 Dehydration: Secondary | ICD-10-CM | POA: Insufficient documentation

## 2019-09-10 LAB — CBC WITH DIFFERENTIAL/PLATELET
Abs Immature Granulocytes: 0.03 10*3/uL (ref 0.00–0.07)
Basophils Absolute: 0.1 10*3/uL (ref 0.0–0.1)
Basophils Relative: 1 %
Eosinophils Absolute: 0.5 10*3/uL (ref 0.0–0.5)
Eosinophils Relative: 5 %
HCT: 41.6 % (ref 36.0–46.0)
Hemoglobin: 13.5 g/dL (ref 12.0–15.0)
Immature Granulocytes: 0 %
Lymphocytes Relative: 17 %
Lymphs Abs: 1.6 10*3/uL (ref 0.7–4.0)
MCH: 31.5 pg (ref 26.0–34.0)
MCHC: 32.5 g/dL (ref 30.0–36.0)
MCV: 97.2 fL (ref 80.0–100.0)
Monocytes Absolute: 0.6 10*3/uL (ref 0.1–1.0)
Monocytes Relative: 7 %
Neutro Abs: 6.5 10*3/uL (ref 1.7–7.7)
Neutrophils Relative %: 70 %
Platelets: 279 10*3/uL (ref 150–400)
RBC: 4.28 MIL/uL (ref 3.87–5.11)
RDW: 13 % (ref 11.5–15.5)
WBC: 9.3 10*3/uL (ref 4.0–10.5)
nRBC: 0 % (ref 0.0–0.2)

## 2019-09-10 LAB — HEMOGLOBIN A1C
Hgb A1c MFr Bld: 5.6 % (ref 4.8–5.6)
Mean Plasma Glucose: 114.02 mg/dL

## 2019-09-10 LAB — RESPIRATORY PANEL BY RT PCR (FLU A&B, COVID)
Influenza A by PCR: NEGATIVE
Influenza B by PCR: NEGATIVE
SARS Coronavirus 2 by RT PCR: NEGATIVE

## 2019-09-10 LAB — CBC
HCT: 44.5 % (ref 36.0–46.0)
Hemoglobin: 14.5 g/dL (ref 12.0–15.0)
MCH: 31.3 pg (ref 26.0–34.0)
MCHC: 32.6 g/dL (ref 30.0–36.0)
MCV: 95.9 fL (ref 80.0–100.0)
Platelets: 310 10*3/uL (ref 150–400)
RBC: 4.64 MIL/uL (ref 3.87–5.11)
RDW: 12.9 % (ref 11.5–15.5)
WBC: 11 10*3/uL — ABNORMAL HIGH (ref 4.0–10.5)
nRBC: 0 % (ref 0.0–0.2)

## 2019-09-10 LAB — COMPREHENSIVE METABOLIC PANEL
ALT: 25 U/L (ref 0–44)
AST: 40 U/L (ref 15–41)
Albumin: 3.9 g/dL (ref 3.5–5.0)
Alkaline Phosphatase: 72 U/L (ref 38–126)
Anion gap: 9 (ref 5–15)
BUN: 26 mg/dL — ABNORMAL HIGH (ref 8–23)
CO2: 28 mmol/L (ref 22–32)
Calcium: 9.4 mg/dL (ref 8.9–10.3)
Chloride: 103 mmol/L (ref 98–111)
Creatinine, Ser: 1.27 mg/dL — ABNORMAL HIGH (ref 0.44–1.00)
GFR calc Af Amer: 46 mL/min — ABNORMAL LOW (ref >60)
GFR calc non Af Amer: 40 mL/min — ABNORMAL LOW (ref >60)
Glucose, Bld: 122 mg/dL — ABNORMAL HIGH (ref 70–99)
Potassium: 4.3 mmol/L (ref 3.5–5.1)
Sodium: 140 mmol/L (ref 135–145)
Total Bilirubin: 0.9 mg/dL (ref 0.3–1.2)
Total Protein: 6.7 g/dL (ref 6.5–8.1)

## 2019-09-10 LAB — CREATININE, SERUM
Creatinine, Ser: 1.08 mg/dL — ABNORMAL HIGH (ref 0.44–1.00)
GFR calc Af Amer: 56 mL/min — ABNORMAL LOW (ref >60)
GFR calc non Af Amer: 48 mL/min — ABNORMAL LOW (ref >60)

## 2019-09-10 LAB — TSH: TSH: 6.526 u[IU]/mL — ABNORMAL HIGH (ref 0.350–4.500)

## 2019-09-10 LAB — BRAIN NATRIURETIC PEPTIDE: B Natriuretic Peptide: 145 pg/mL — ABNORMAL HIGH (ref 0.0–100.0)

## 2019-09-10 LAB — POC SARS CORONAVIRUS 2 AG: SARS Coronavirus 2 Ag: NEGATIVE

## 2019-09-10 LAB — TROPONIN I (HIGH SENSITIVITY)
Troponin I (High Sensitivity): 5 ng/L (ref <18)
Troponin I (High Sensitivity): 5 ng/L (ref <18)

## 2019-09-10 LAB — ECHOCARDIOGRAM COMPLETE
Height: 66 in
Weight: 2288 oz

## 2019-09-10 LAB — GLUCOSE, CAPILLARY: Glucose-Capillary: 124 mg/dL — ABNORMAL HIGH (ref 70–99)

## 2019-09-10 MED ORDER — HYDROCHLOROTHIAZIDE 12.5 MG PO CAPS
12.5000 mg | ORAL_CAPSULE | Freq: Every day | ORAL | Status: DC
  Administered 2019-09-10 – 2019-09-11 (×2): 12.5 mg via ORAL
  Filled 2019-09-10 (×2): qty 1

## 2019-09-10 MED ORDER — CLOPIDOGREL BISULFATE 75 MG PO TABS
300.0000 mg | ORAL_TABLET | Freq: Once | ORAL | Status: AC
  Administered 2019-09-10: 300 mg via ORAL
  Filled 2019-09-10: qty 4

## 2019-09-10 MED ORDER — MELATONIN 5 MG PO TABS: 5.0000 mg | ORAL_TABLET | Freq: Every evening | ORAL | Status: DC | PRN

## 2019-09-10 MED ORDER — LOSARTAN POTASSIUM 50 MG PO TABS
50.0000 mg | ORAL_TABLET | Freq: Every day | ORAL | Status: DC
  Administered 2019-09-10 – 2019-09-11 (×2): 50 mg via ORAL
  Filled 2019-09-10 (×2): qty 1

## 2019-09-10 MED ORDER — SODIUM CHLORIDE 0.9% FLUSH
3.0000 mL | Freq: Two times a day (BID) | INTRAVENOUS | Status: DC
  Administered 2019-09-10 – 2019-09-11 (×2): 3 mL via INTRAVENOUS

## 2019-09-10 MED ORDER — ALBUTEROL SULFATE HFA 108 (90 BASE) MCG/ACT IN AERS
1.0000 | INHALATION_SPRAY | Freq: Four times a day (QID) | RESPIRATORY_TRACT | Status: DC | PRN
  Filled 2019-09-10: qty 6.7

## 2019-09-10 MED ORDER — LOSARTAN POTASSIUM-HCTZ 50-12.5 MG PO TABS: 1.0000 | ORAL_TABLET | Freq: Every day | ORAL | Status: DC

## 2019-09-10 MED ORDER — LIDOCAINE-EPINEPHRINE-TETRACAINE (LET) TOPICAL GEL
3.0000 mL | Freq: Once | TOPICAL | Status: AC
  Administered 2019-09-10: 3 mL via TOPICAL

## 2019-09-10 MED ORDER — TRAZODONE HCL 50 MG PO TABS
50.0000 mg | ORAL_TABLET | Freq: Every day | ORAL | Status: DC
  Administered 2019-09-10: 50 mg via ORAL
  Filled 2019-09-10: qty 1

## 2019-09-10 MED ORDER — OXYBUTYNIN CHLORIDE 5 MG PO TABS
5.0000 mg | ORAL_TABLET | Freq: Two times a day (BID) | ORAL | Status: DC
  Administered 2019-09-10 – 2019-09-11 (×3): 5 mg via ORAL
  Filled 2019-09-10 (×5): qty 1

## 2019-09-10 MED ORDER — TRAMADOL HCL 50 MG PO TABS
50.0000 mg | ORAL_TABLET | Freq: Two times a day (BID) | ORAL | Status: DC
  Administered 2019-09-10 – 2019-09-11 (×3): 50 mg via ORAL
  Filled 2019-09-10 (×3): qty 1

## 2019-09-10 MED ORDER — FLUOXETINE HCL 20 MG PO CAPS
40.0000 mg | ORAL_CAPSULE | Freq: Every day | ORAL | Status: DC
  Filled 2019-09-10 (×2): qty 2

## 2019-09-10 MED ORDER — ENOXAPARIN SODIUM 40 MG/0.4ML ~~LOC~~ SOLN
40.0000 mg | SUBCUTANEOUS | Status: DC
  Administered 2019-09-10: 40 mg via SUBCUTANEOUS
  Filled 2019-09-10: qty 0.4

## 2019-09-10 NOTE — ED Notes (Signed)
Pt taken to US

## 2019-09-10 NOTE — ED Notes (Signed)
Pt transported to CT ?

## 2019-09-10 NOTE — ED Notes (Signed)
Pt st SOB for "2 days" and dry cough "started today". Pt denies fevers, CP/abd pain/N/V/D.

## 2019-09-10 NOTE — Consult Note (Signed)
CARDIOLOGY CONSULT NOTE               Patient ID: Jenna Powers MRN: [id number] DOB/AGE: 01/21/1941 79 y.o.  Admit date: 09/10/2019 Referring Physician Dr. Delfino Lovett Primary Physician Dr. Zada Finders Primary Cardiologist none [facility] consulted Reason for Consultation syncope possible arrhythmia  HPI: Patient is a 79 year old female history of CVA smoking hypertension hyperlipidemia neuropathy thyroid disease reportedly was standing ready to go visit a friend NG head with some likely syncopal episode she had no warning she is not sure how long she was out and she suffered an injury to the left side of her head with some bleeding.  Patient did not have any prodrome no palpitation tachycardia or chest pain she is not had any episodes of syncope recently she has not fallen as well patient was brought him there was some concern that she may be ventricular arrhythmia on the strips and not available denies any chest pain palpitations or tachycardia feels reasonably well now except for headache cardiology was then consulted for syncopal type episode  Review of systems complete and found to be negative unless listed above     Past Medical History:  Diagnosis Date  . Depression   . Hyperlipemia   . Hypertension   . Neuropathy   . Thyroid disease   . Tremor     Past Surgical History:  Procedure Laterality Date  . ABDOMINAL HYSTERECTOMY    . ANKLE SURGERY Right     (Not in a hospital admission)  Social History   Socioeconomic History  . Marital status: Married    Spouse name: Not on file  . Number of children: Not on file  . Years of education: Not on file  . Highest education level: Not on file  Occupational History  . Not on file  Tobacco Use  . Smoking status: Current Some Day Smoker    Packs/day: 0.15    Types: Cigarettes  . Smokeless tobacco: Never Used  Substance and Sexual Activity  . Alcohol use: Yes  . Drug use: No  . Sexual activity: Not on file  Other Topics  Concern  . Not on file  Social History Narrative  . Not on file   Social Determinants of Health   Financial Resource Strain:   . Difficulty of Paying Living Expenses: Not on file  Food Insecurity:   . Worried About Programme researcher, broadcasting/film/video in the Last Year: Not on file  . Ran Out of Food in the Last Year: Not on file  Transportation Needs:   . Lack of Transportation (Medical): Not on file  . Lack of Transportation (Non-Medical): Not on file  Physical Activity:   . Days of Exercise per Week: Not on file  . Minutes of Exercise per Session: Not on file  Stress:   . Feeling of Stress : Not on file  Social Connections:   . Frequency of Communication with Friends and Family: Not on file  . Frequency of Social Gatherings with Friends and Family: Not on file  . Attends Religious Services: Not on file  . Active Member of Clubs or Organizations: Not on file  . Attends Banker Meetings: Not on file  . Marital Status: Not on file  Intimate Partner Violence:   . Fear of Current or Ex-Partner: Not on file  . Emotionally Abused: Not on file  . Physically Abused: Not on file  . Sexually Abused: Not on file    History reviewed. No pertinent  family history.    Review of systems complete and found to be negative unless listed above      PHYSICAL EXAM  General: Well developed, well nourished, in no acute distress HEENT:  Normocephalic and atramatic Neck:  No JVD.  Lungs: Clear bilaterally to auscultation and percussion. Heart: HRRR . Normal S1 and S2 without gallops or murmurs.  Abdomen: Bowel sounds are positive, abdomen soft and non-tender  Msk:  Back normal, normal gait. Normal strength and tone for age. Extremities: No clubbing, cyanosis or edema.   Neuro: Alert and oriented X 3. Psych:  Good affect, responds appropriately  Labs:   Lab Results  Component Value Date   WBC 9.3 09/10/2019   HGB 13.5 09/10/2019   HCT 41.6 09/10/2019   MCV 97.2 09/10/2019   PLT 279  09/10/2019    Recent Labs  Lab 09/10/19 1131  NA 140  K 4.3  CL 103  CO2 28  BUN 26*  CREATININE 1.27*  CALCIUM 9.4  PROT 6.7  BILITOT 0.9  ALKPHOS 72  ALT 25  AST 40  GLUCOSE 122*   Lab Results  Component Value Date   CKTOTAL 80 06/14/2013   CKMB 1.1 06/14/2013   TROPONINI 0.15 (H) 06/14/2013    Lab Results  Component Value Date   CHOL 217 (H) 06/14/2013   Lab Results  Component Value Date   HDL 44 06/14/2013   Lab Results  Component Value Date   LDLCALC 124 (H) 06/14/2013   Lab Results  Component Value Date   TRIG 245 (H) 06/14/2013   No results found for: CHOLHDL No results found for: LDLDIRECT    Radiology: CT Head Wo Contrast  Result Date: 09/10/2019 CLINICAL DATA:  Larey Seat.  Hit head. EXAM: CT HEAD WITHOUT CONTRAST CT MAXILLOFACIAL WITHOUT CONTRAST CT CERVICAL SPINE WITHOUT CONTRAST TECHNIQUE: Multidetector CT imaging of the head, cervical spine, and maxillofacial structures were performed using the standard protocol without intravenous contrast. Multiplanar CT image reconstructions of the cervical spine and maxillofacial structures were also generated. COMPARISON:  Head CT 01/23/2016 FINDINGS: CT HEAD FINDINGS Brain: The ventricles are in the midline without mass effect or shift. They are normal in size and configuration given the degree of cerebral atrophy. No acute intracranial findings such as hemispheric infarction or intracranial hemorrhage. No extra-axial fluid collections are identified. Vascular: Scattered vascular calcifications. No aneurysm or hyperdense vessels. Skull: No skull fracture.  No bone lesions. Other: There is a moderate-sized left frontoparietal scalp hematoma and laceration with a small amount of air in the soft tissues. No radiopaque foreign body. No underlying skull fracture. CT MAXILLOFACIAL FINDINGS Osseous: No fracture or mandibular dislocation. No destructive process. Orbits: Negative. No traumatic or inflammatory finding. Sinuses: The  paranasal sinuses and mastoid air cells are clear except for a tiny amount of fluid in the right maxillary sinus. Soft tissues: Left frontoparietal scalp hematoma. No facial hematomas. 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 3 mm of posterior subluxation of C5 noted. Skull base and vertebrae: No acute fractures are identified. Soft tissues and spinal canal: No prevertebral fluid or swelling. No visible canal hematoma. Disc levels: The spinal canal is fairly generous. No significant spinal stenosis. Moderate bony foraminal narrowing at C5-6 due to uncinate spurring and facet disease. Upper chest: The lung apices are grossly clear. Marked tortuosity, ectasia and calcification of the thoracic aorta. Other: Bilateral carotid artery calcifications  are noted. IMPRESSION: 1. Left frontal parietal scalp hematoma and laceration but no radiopaque foreign body or underlying skull fracture. 2. No acute intracranial findings. 3. No acute facial bone fractures. 4. 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 Electronically Signed   By: Rudie Meyer M.D.   On: 09/10/2019 12:23   CT Cervical Spine Wo Contrast  Result Date: 09/10/2019 CLINICAL DATA:  Larey Seat.  Hit head. EXAM: CT HEAD WITHOUT CONTRAST CT MAXILLOFACIAL WITHOUT CONTRAST CT CERVICAL SPINE WITHOUT CONTRAST TECHNIQUE: Multidetector CT imaging of the head, cervical spine, and maxillofacial structures were performed using the standard protocol without intravenous contrast. Multiplanar CT image reconstructions of the cervical spine and maxillofacial structures were also generated. COMPARISON:  Head CT 01/23/2016 FINDINGS: CT HEAD FINDINGS Brain: The ventricles are in the midline without mass effect or shift. They are normal in size and configuration given  the degree of cerebral atrophy. No acute intracranial findings such as hemispheric infarction or intracranial hemorrhage. No extra-axial fluid collections are identified. Vascular: Scattered vascular calcifications. No aneurysm or hyperdense vessels. Skull: No skull fracture.  No bone lesions. Other: There is a moderate-sized left frontoparietal scalp hematoma and laceration with a small amount of air in the soft tissues. No radiopaque foreign body. No underlying skull fracture. CT MAXILLOFACIAL FINDINGS Osseous: No fracture or mandibular dislocation. No destructive process. Orbits: Negative. No traumatic or inflammatory finding. Sinuses: The paranasal sinuses and mastoid air cells are clear except for a tiny amount of fluid in the right maxillary sinus. Soft tissues: Left frontoparietal scalp hematoma. No facial hematomas. 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 3 mm of posterior subluxation of C5 noted. Skull base and vertebrae: No acute fractures are identified. Soft tissues and spinal canal: No prevertebral fluid or swelling. No visible canal hematoma. Disc levels: The spinal canal is fairly generous. No significant spinal stenosis. Moderate bony foraminal narrowing at C5-6 due to uncinate spurring and facet disease. Upper chest: The lung apices are grossly clear. Marked tortuosity, ectasia and calcification of the thoracic aorta. Other: Bilateral carotid artery calcifications are noted. IMPRESSION: 1. Left frontal parietal scalp hematoma and laceration but no radiopaque foreign body or underlying skull fracture. 2. No acute intracranial findings. 3. No acute facial bone fractures. 4. 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 Electronically Signed   By: Rudie Meyer M.D.   On: 09/10/2019  12:23   DG Chest Portable 1 View  Result Date: 09/10/2019 CLINICAL DATA:  79 year old female with history of syncope. Fall. Evaluate for infiltrates. EXAM: PORTABLE CHEST 1 VIEW COMPARISON:  Chest x-ray 08/14/2018. FINDINGS: New areas of diffuse interstitial prominence noted throughout the lungs bilaterally. No confluent consolidative airspace disease. No pneumothorax. No pleural effusions. No evidence of pulmonary edema. Heart size is normal. Upper mediastinal contours are within normal limits. Aortic atherosclerosis. IMPRESSION: 1. Diffuse interstitial prominence new compared to prior studies. Clinical correlation for signs and symptoms of atypical infection including viral pneumonia is recommended. Electronically Signed   By: Trudie Reed M.D.   On: 09/10/2019 11:36   CT Maxillofacial Wo Contrast  Result Date: 09/10/2019 CLINICAL DATA:  Larey Seat.  Hit head. EXAM: CT HEAD WITHOUT CONTRAST CT MAXILLOFACIAL WITHOUT CONTRAST CT CERVICAL SPINE WITHOUT CONTRAST TECHNIQUE: Multidetector CT imaging of the head, cervical spine, and maxillofacial structures  were performed using the standard protocol without intravenous contrast. Multiplanar CT image reconstructions of the cervical spine and maxillofacial structures were also generated. COMPARISON:  Head CT 01/23/2016 FINDINGS: CT HEAD FINDINGS Brain: The ventricles are in the midline without mass effect or shift. They are normal in size and configuration given the degree of cerebral atrophy. No acute intracranial findings such as hemispheric infarction or intracranial hemorrhage. No extra-axial fluid collections are identified. Vascular: Scattered vascular calcifications. No aneurysm or hyperdense vessels. Skull: No skull fracture.  No bone lesions. Other: There is a moderate-sized left frontoparietal scalp hematoma and laceration with a small amount of air in the soft tissues. No radiopaque foreign body. No underlying skull fracture. CT MAXILLOFACIAL FINDINGS  Osseous: No fracture or mandibular dislocation. No destructive process. Orbits: Negative. No traumatic or inflammatory finding. Sinuses: The paranasal sinuses and mastoid air cells are clear except for a tiny amount of fluid in the right maxillary sinus. Soft tissues: Left frontoparietal scalp hematoma. No facial hematomas. 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 3 mm of posterior subluxation of C5 noted. Skull base and vertebrae: No acute fractures are identified. Soft tissues and spinal canal: No prevertebral fluid or swelling. No visible canal hematoma. Disc levels: The spinal canal is fairly generous. No significant spinal stenosis. Moderate bony foraminal narrowing at C5-6 due to uncinate spurring and facet disease. Upper chest: The lung apices are grossly clear. Marked tortuosity, ectasia and calcification of the thoracic aorta. Other: Bilateral carotid artery calcifications are noted. IMPRESSION: 1. Left frontal parietal scalp hematoma and laceration but no radiopaque foreign body or underlying skull fracture. 2. No acute intracranial findings. 3. No acute facial bone fractures. 4. 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 Electronically Signed   By: Marijo Sanes M.D.   On: 09/10/2019 12:23    EKG: Normal sinus rhythm nonspecific ST-T wave changes  ASSESSMENT AND PLAN:  Syncope Reported ventricular tachycardia Hypertension CVA Depression COPD asthma Smoking Depression Thyroid disease  DJD ankle . Plan Agree with admit to telemetry Follow-up EKGs troponin Consider neurology evaluation including possible EEG to evaluate for seizures Recommend 72-hour Holter and if negative and event monitor Orthostatic  blood pressures Continue aspirin Plavix for previous CVA Agree  with thyroid medication for thyroid issues Advised patient to refrain from smoking Echocardiogram will be helpful for evaluation of cardiac structures and left ventricular function Consider functional study possible as an outpatient to evaluate for provokable arrhythmia as well as coronary artery disease    Signed: Yolonda Kida MD 09/10/2019, 4:32 PM

## 2019-09-10 NOTE — ED Notes (Signed)
Pt back to room 18 from CT.

## 2019-09-10 NOTE — ED Provider Notes (Signed)
[facility] Emergency Department Provider Note    None    (approximate)  I have reviewed the triage vital signs and the nursing notes.   HISTORY  Chief Complaint Fall and Near Syncope    HPI Jenna Powers is a 79 y.o. female with the below listed past medical history presents the ER after syncopal event fall hitting the left side of her head.  Patient does not remember what caused her to fall.  States she was otherwise feeling well this morning to take her medications.  States that she does have a mild headache right now has contusion left side of her forehead.  EMS found the patient she does not complain of any chest pain or discomfort.  No nausea.  Reportedly had run of V. tach and was started on amiodarone.  Patient denies any palpitations or chest discomfort at this time.    Past Medical History:  Diagnosis Date  . Depression   . Hyperlipemia   . Hypertension   . Neuropathy   . Thyroid disease   . Tremor    History reviewed. No pertinent family history. Past Surgical History:  Procedure Laterality Date  . ABDOMINAL HYSTERECTOMY    . ANKLE SURGERY Right    There are no problems to display for this patient.     Prior to Admission medications   Medication Sig Start Date End Date Taking? Authorizing Provider  albuterol (PROVENTIL HFA;VENTOLIN HFA) 108 (90 Base) MCG/ACT inhaler Inhale 1-2 puffs into the lungs every 6 (six) hours as needed for wheezing or shortness of breath. Use with spacer 08/14/18   Lutricia Feil, PA-C  benzonatate (TESSALON) 200 MG capsule Take one cap TID PRN cough 08/14/18   Lutricia Feil, PA-C  clopidogrel (PLAVIX) 300 MG TABS tablet Take 300 mg by mouth once.    [provider]  doxycycline (VIBRAMYCIN) 100 MG capsule Take 1 capsule (100 mg total) by mouth 2 (two) times daily. 08/14/18   Lutricia Feil, PA-C  FLUoxetine (PROZAC) 40 MG capsule Take 40 mg by mouth daily.    [provider]   HYDROcodone-acetaminophen (NORCO/VICODIN) 5-325 MG tablet hydrocodone 5 mg-acetaminophen 325 mg tablet    [provider]  losartan-hydrochlorothiazide (HYZAAR) 50-12.5 MG tablet Take 1 tablet by mouth daily.    [provider]  oxybutynin (DITROPAN) 5 MG tablet Take 5 mg by mouth 2 (two) times daily.     [provider]  traMADol (ULTRAM) 50 MG tablet Take 1 tablet (50 mg total) by mouth 2 (two) times daily. 08/08/15   Renford Dills, NP  traZODone (DESYREL) 50 MG tablet Take 50 mg by mouth at bedtime.    [provider]    Allergies Patient has no known allergies.    Social History Social History   Tobacco Use  . Smoking status: Current Some Day Smoker    Packs/day: 0.15    Types: Cigarettes  . Smokeless tobacco: Never Used  Substance Use Topics  . Alcohol use: Yes  . Drug use: No    Review of Systems Patient denies headaches, rhinorrhea, blurry vision, numbness, shortness of breath, chest pain, edema, cough, abdominal pain, nausea, vomiting, diarrhea, dysuria, fevers, rashes or hallucinations unless otherwise stated above in HPI. ____________________________________________   PHYSICAL EXAM:  VITAL SIGNS: Vitals:   09/10/19 1130 09/10/19 1333  BP: (!) 168/97 (!) 159/89  Pulse: 64 78  Resp: 16 14  Temp:    SpO2: 97% 99%  Constitutional: Alert and oriented.  Eyes: Conjunctivae are normal. No proptosis, EOMI Head: contusion and swelling to left lateral eyebrow,  Nose: No congestion/rhinnorhea. Mouth/Throat: Mucous membranes are moist.   Neck: No stridor. Painless ROM.  Cardiovascular: Normal rate, regular rhythm. Grossly normal heart sounds.  Good peripheral circulation. Respiratory: Normal respiratory effort.  No retractions. Lungs CTAB. Gastrointestinal: Soft and nontender. No distention. No abdominal bruits. No CVA tenderness. Genitourinary:  Musculoskeletal: No lower extremity tenderness nor edema.  No joint  effusions. Neurologic:  Normal speech and language. No gross focal neurologic deficits are appreciated. No facial droop Skin:  Skin is warm, dry and intact. No rash noted. Psychiatric: Mood and affect are normal. Speech and behavior are normal.  ____________________________________________   LABS (all labs ordered are listed, but only abnormal results are displayed)  Results for orders placed or performed during the hospital encounter of 09/10/19 (from the past 24 hour(s))  CBC with Differential/Platelet     Status: None   Collection Time: 09/10/19 11:31 AM  Result Value Ref Range   WBC 9.3 4.0 - 10.5 K/uL   RBC 4.28 3.87 - 5.11 MIL/uL   Hemoglobin 13.5 12.0 - 15.0 g/dL   HCT 31.5 40.0 - 86.7 %   MCV 97.2 80.0 - 100.0 fL   MCH 31.5 26.0 - 34.0 pg   MCHC 32.5 30.0 - 36.0 g/dL   RDW 61.9 50.9 - 32.6 %   Platelets 279 150 - 400 K/uL   nRBC 0.0 0.0 - 0.2 %   Neutrophils Relative % 70 %   Neutro Abs 6.5 1.7 - 7.7 K/uL   Lymphocytes Relative 17 %   Lymphs Abs 1.6 0.7 - 4.0 K/uL   Monocytes Relative 7 %   Monocytes Absolute 0.6 0.1 - 1.0 K/uL   Eosinophils Relative 5 %   Eosinophils Absolute 0.5 0.0 - 0.5 K/uL   Basophils Relative 1 %   Basophils Absolute 0.1 0.0 - 0.1 K/uL   Immature Granulocytes 0 %   Abs Immature Granulocytes 0.03 0.00 - 0.07 K/uL  Comprehensive metabolic panel     Status: Abnormal   Collection Time: 09/10/19 11:31 AM  Result Value Ref Range   Sodium 140 135 - 145 mmol/L   Potassium 4.3 3.5 - 5.1 mmol/L   Chloride 103 98 - 111 mmol/L   CO2 28 22 - 32 mmol/L   Glucose, Bld 122 (H) 70 - 99 mg/dL   BUN 26 (H) 8 - 23 mg/dL   Creatinine, Ser 7.12 (H) 0.44 - 1.00 mg/dL   Calcium 9.4 8.9 - 45.8 mg/dL   Total Protein 6.7 6.5 - 8.1 g/dL   Albumin 3.9 3.5 - 5.0 g/dL   AST 40 15 - 41 U/L   ALT 25 0 - 44 U/L   Alkaline Phosphatase 72 38 - 126 U/L   Total Bilirubin 0.9 0.3 - 1.2 mg/dL   GFR calc non Af Amer 40 (L) >60 mL/min   GFR calc Af Amer 46 (L) >60 mL/min    Anion gap 9 5 - 15  Troponin I (High Sensitivity)     Status: None   Collection Time: 09/10/19 11:31 AM  Result Value Ref Range   Troponin I (High Sensitivity) 5 <18 ng/L  Glucose, capillary     Status: Abnormal   Collection Time: 09/10/19 11:42 AM  Result Value Ref Range   Glucose-Capillary 124 (H) 70 - 99 mg/dL  POC SARS Coronavirus 2 Ag     Status: None   Collection  Time: 09/10/19 12:56 PM  Result Value Ref Range   SARS Coronavirus 2 Ag NEGATIVE NEGATIVE  Troponin I (High Sensitivity)     Status: None   Collection Time: 09/10/19  1:29 PM  Result Value Ref Range   Troponin I (High Sensitivity) 5 <18 ng/L  Brain natriuretic peptide     Status: Abnormal   Collection Time: 09/10/19  1:29 PM  Result Value Ref Range   B Natriuretic Peptide 145.0 (H) 0.0 - 100.0 pg/mL   ____________________________________________  EKG My review and personal interpretation at Time: 11:32   Indication: syncope  Rate: 60  Rhythm: sinus Axis: nml Other: nml intervals, no stemi, nonspecific st abnmlity ____________________________________________  RADIOLOGY  I personally reviewed all radiographic images ordered to evaluate for the above acute complaints and reviewed radiology reports and findings.  These findings were personally discussed with the patient.  Please see medical record for radiology report.  ____________________________________________   PROCEDURES  Procedure(s) performed:  Marland KitchenMarland KitchenLaceration Repair  Date/Time: 09/10/2019 2:24 PM Performed by: Merlyn Lot, MD Authorized by: Merlyn Lot, MD   Consent:    Consent obtained:  Verbal   Consent given by:  Patient   Risks discussed:  Infection, pain, retained foreign body, poor cosmetic result and poor wound healing Anesthesia (see MAR for exact dosages):    Anesthesia method:  Topical application   Topical anesthetic:  LET Laceration details:    Location:  Face   Face location:  L eyebrow   Length (cm):  1   Depth  (mm):  2 Repair type:    Repair type:  Simple Exploration:    Hemostasis achieved with:  Direct pressure   Wound exploration: entire depth of wound probed and visualized     Contaminated: no   Treatment:    Area cleansed with:  Saline and Hibiclens   Amount of cleaning:  Standard   Irrigation solution:  Sterile saline   Visualized foreign bodies/material removed: no   Skin repair:    Repair method:  Sutures   Suture size:  6-0   Suture material:  Prolene   Suture technique:  Simple interrupted   Number of sutures:  1 Approximation:    Approximation:  Close Post-procedure details:    Dressing:  Sterile dressing   Patient tolerance of procedure:  Tolerated well, no immediate complications      Critical Care performed: no ____________________________________________   INITIAL IMPRESSION / ASSESSMENT AND PLAN / ED COURSE  Pertinent labs & imaging results that were available during my care of the patient were reviewed by me and considered in my medical decision making (see chart for details).   DDX: acs, dysrhythmia, hypoglycemia, electrolyte abnmlty, anemia, sepsis  Klyn Kroening is a 79 y.o. who presents to the ED with syncopal event as described above patient in no acute distress at this point.  Blood work sent for above differential was this is a very broad differential.  She was given amiodarone in route due to run of V. tach.  Certainly no evidence of persistent V. tach she is not complaining of any chest pain.  Will observe on telemetry.  Clinical Course as of Sep 10 1423  Thu Sep 10, 2019  1408 Blood work and imaging is reassuring.  We will plan lack repair.  Given her syncopal event reported run of V. tach with EMS will admit to the hospitalist for further syncopal work-up and telemetry monitoring.   [PR]    Clinical Course User Index [PR] Quentin Cornwall,  Luisa Hart, MD    The patient was evaluated in Emergency Department today for the symptoms described in the history  of present illness. He/she was evaluated in the context of the global COVID-19 pandemic, which necessitated consideration that the patient might be at risk for infection with the SARS-CoV-2 virus that causes COVID-19. Institutional protocols and algorithms that pertain to the evaluation of patients at risk for COVID-19 are in a state of rapid change based on information released by regulatory bodies including the CDC and federal and state organizations. These policies and algorithms were followed during the patient's care in the ED.  As part of my medical decision making, I reviewed the following data within the electronic MEDICAL RECORD NUMBER Nursing notes reviewed and incorporated, Labs reviewed, notes from prior ED visits and [place] Controlled Substance Database   ____________________________________________   FINAL CLINICAL IMPRESSION(S) / ED DIAGNOSES  Final diagnoses:  Syncope and collapse  Facial laceration, initial encounter      NEW MEDICATIONS STARTED DURING THIS VISIT:  New Prescriptions   No medications on file     Note:  This document was prepared using Dragon voice recognition software and may include unintentional dictation errors.    Willy Eddy, MD 09/10/19 1426

## 2019-09-10 NOTE — ED Triage Notes (Addendum)
Pt from home via [facility]. Per EMS, pt called EMS due a fall. St walking to the bathroom and waking up in the cough with blood "on my head and called 911". Pt on Plavix. Pt present with C-collar in placed, left hematoma to to L/head.[facility] reports prolonged QT present on EKG. NS given in route. EDP Roxan Hockey at bedside upon arrival.

## 2019-09-10 NOTE — H&P (Signed)
[place] [facility]   PATIENT NAME: Jenna Powers    MR#:  [id number]  DATE OF BIRTH:  12-27-1940  DATE OF ADMISSION:  09/10/2019  PRIMARY CARE PHYSICIAN: Dortha Kern, MD   REQUESTING/REFERRING PHYSICIAN: Willy Eddy, MD  CHIEF COMPLAINT:   Chief Complaint  Patient presents with  . Fall  . Near Syncope    HISTORY OF PRESENT ILLNESS:  Jenna Powers  is a 79 y.o. female with a known history of CVA, chronic back pain, neuropathy, recurrent falls, depression, hypothyroidism, history of ADHD, hypertension, migraine headaches is being admitted for syncope.  Reportedly patient was walking to the bathroom and woke up with blood "on my head" s/p fall. She had no warning she is not sure how long she was out and she suffered an injury to the left side of her head with some bleeding. EDP Reported run of V. tach enroute by EMS and given amio which was stopped in ED as no strips to confirm. While in ED, she is in NSR. Denies any symptoms now. PT in room working with her when I saw her. PAST MEDICAL HISTORY:   Past Medical History:  Diagnosis Date  . Depression   . Hyperlipemia   . Hypertension   . Neuropathy   . Thyroid disease   . Tremor     PAST SURGICAL HISTORY:   Past Surgical History:  Procedure Laterality Date  . ABDOMINAL HYSTERECTOMY    . ANKLE SURGERY Right     SOCIAL HISTORY:   Social History   Tobacco Use  . Smoking status: Current Some Day Smoker    Packs/day: 0.15    Types: Cigarettes  . Smokeless tobacco: Never Used  Substance Use Topics  . Alcohol use: Yes    FAMILY HISTORY:  History reviewed. No pertinent family history. Father had a stroke DRUG ALLERGIES:  No Known Allergies  REVIEW OF SYSTEMS:   Review of Systems  Constitutional: Negative for diaphoresis, fever, malaise/fatigue and weight loss.  HENT: Negative for ear discharge, ear pain, hearing loss, nosebleeds, sore throat and tinnitus.   Eyes: Negative for  blurred vision and pain.  Respiratory: Negative for cough, hemoptysis, shortness of breath and wheezing.   Cardiovascular: Negative for chest pain, palpitations, orthopnea and leg swelling.  Gastrointestinal: Negative for abdominal pain, blood in stool, constipation, diarrhea, heartburn, nausea and vomiting.  Genitourinary: Negative for dysuria, frequency and urgency.  Musculoskeletal: Positive for falls. Negative for back pain and myalgias.  Skin: Negative for itching and rash.  Neurological: Positive for dizziness and loss of consciousness. Negative for tingling, tremors, focal weakness, seizures, weakness and headaches.  Psychiatric/Behavioral: Negative for depression. The patient is not nervous/anxious.     MEDICATIONS AT HOME:   Prior to Admission medications   Medication Sig Start Date End Date Taking? Authorizing Provider  albuterol (PROVENTIL HFA;VENTOLIN HFA) 108 (90 Base) MCG/ACT inhaler Inhale 1-2 puffs into the lungs every 6 (six) hours as needed for wheezing or shortness of breath. Use with spacer 08/14/18   Lutricia Feil, PA-C  amLODipine (NORVASC) 10 MG tablet Take 10 mg by mouth daily. 08/28/19   [provider]  aspirin 81 MG EC tablet Take 81 mg by mouth daily.    [provider]  clopidogrel (PLAVIX) 75 MG tablet Take 75 mg by mouth daily. 05/28/19   [provider]  FLUoxetine (PROZAC) 40 MG capsule Take 40 mg by mouth daily.    [provider]  hydrochlorothiazide (HYDRODIURIL)  25 MG tablet Take 25 mg by mouth daily.    [provider]  HYDROcodone-acetaminophen (NORCO/VICODIN) 5-325 MG tablet Take 1 tablet by mouth every 6 (six) hours as needed for moderate pain.     [provider]  hydrOXYzine (ATARAX/VISTARIL) 25 MG tablet Take 25 mg by mouth 4 (four) times daily as needed. 08/04/19   [provider]  levothyroxine (SYNTHROID) 25 MCG tablet Take 25 mcg by mouth every morning. 07/31/19   [provider]  lisinopril (ZESTRIL) 40 MG tablet Take 40 mg by mouth daily. 07/23/19   [provider]  oxybutynin (DITROPAN) 5 MG tablet Take 5 mg by mouth 2 (two) times daily.     [provider]  propranolol ER (INDERAL LA) 80 MG 24 hr capsule Take 80 mg by mouth daily. 08/13/19   [provider]  sertraline (ZOLOFT) 100 MG tablet Take 100 mg by mouth daily. 08/27/19   [provider]  tiZANidine (ZANAFLEX) 2 MG tablet Take 2 mg by mouth at bedtime as needed. 07/12/19   [provider]  traZODone (DESYREL) 50 MG tablet Take 50 mg by mouth at bedtime.    [provider]      VITAL SIGNS:  Blood pressure (!) 147/84, pulse 76, temperature 98 F (36.7 C), temperature source Oral, resp. rate 10, height 5\' 8"  (1.727 m), weight 63.5 kg, SpO2 96 %.  PHYSICAL EXAMINATION:  Physical Exam  GENERAL:  79 y.o.-year-old patient lying in the bed with no acute distress.  EYES: Pupils equal, round, reactive to light and accommodation. No scleral icterus. Extraocular muscles intact.  HEENT: Head atraumatic, normocephalic. Oropharynx and nasopharynx clear.  NECK:  Supple, no jugular venous distention. No thyroid enlargement, no tenderness.  LUNGS: Normal breath sounds bilaterally, no wheezing, rales,rhonchi or crepitation. No use of accessory muscles of respiration.  CARDIOVASCULAR: S1, S2 normal. No murmurs, rubs, or gallops.  ABDOMEN: Soft, nontender, nondistended. Bowel sounds present. No organomegaly or mass.  EXTREMITIES: No pedal edema, cyanosis, or clubbing.  NEUROLOGIC: Cranial nerves II through XII are intact. Muscle strength 5/5 in all extremities. Sensation intact. Gait not checked.  PSYCHIATRIC: The patient is alert and oriented x 3.  SKIN: No obvious rash, lesion, or ulcer. Left Facial Laceration Repair and dry blood over left side of head  LABORATORY PANEL:   CBC Recent Labs  Lab 09/10/19 1131  WBC 9.3  HGB 13.5  HCT 41.6  PLT 279    ------------------------------------------------------------------------------------------------------------------  Chemistries  Recent Labs  Lab 09/10/19 1131  NA 140  K 4.3  CL 103  CO2 28  GLUCOSE 122*  BUN 26*  CREATININE 1.27*  CALCIUM 9.4  AST 40  ALT 25  ALKPHOS 72  BILITOT 0.9   ------------------------------------------------------------------------------------------------------------------  Cardiac Enzymes No results for input(s): TROPONINI in the last 168 hours. ------------------------------------------------------------------------------------------------------------------  RADIOLOGY:  CT Head Wo Contrast  Result Date: 09/10/2019 CLINICAL DATA:  09/12/2019.  Hit head. EXAM: CT HEAD WITHOUT CONTRAST CT MAXILLOFACIAL WITHOUT CONTRAST CT CERVICAL SPINE WITHOUT CONTRAST TECHNIQUE: Multidetector CT imaging of the head, cervical spine, and maxillofacial structures were performed using the standard protocol without intravenous contrast. Multiplanar CT image reconstructions of the cervical spine and maxillofacial structures were also generated. COMPARISON:  Head CT 01/23/2016 FINDINGS: CT HEAD FINDINGS Brain: The ventricles are in the midline without mass effect or shift. They are normal in size and configuration given the degree of cerebral atrophy. No acute intracranial findings such as hemispheric infarction or intracranial hemorrhage. No  extra-axial fluid collections are identified. Vascular: Scattered vascular calcifications. No aneurysm or hyperdense vessels. Skull: No skull fracture.  No bone lesions. Other: There is a moderate-sized left frontoparietal scalp hematoma and laceration with a small amount of air in the soft tissues. No radiopaque foreign body. No underlying skull fracture. CT MAXILLOFACIAL FINDINGS Osseous: No fracture or mandibular dislocation. No destructive process. Orbits: Negative. No traumatic or inflammatory finding. Sinuses: The paranasal sinuses and  mastoid air cells are clear except for a tiny amount of fluid in the right maxillary sinus. Soft tissues: Left frontoparietal scalp hematoma. No facial hematomas. 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 3 mm of posterior subluxation of C5 noted. Skull base and vertebrae: No acute fractures are identified. Soft tissues and spinal canal: No prevertebral fluid or swelling. No visible canal hematoma. Disc levels: The spinal canal is fairly generous. No significant spinal stenosis. Moderate bony foraminal narrowing at C5-6 due to uncinate spurring and facet disease. Upper chest: The lung apices are grossly clear. Marked tortuosity, ectasia and calcification of the thoracic aorta. Other: Bilateral carotid artery calcifications are noted. IMPRESSION: 1. Left frontal parietal scalp hematoma and laceration but no radiopaque foreign body or underlying skull fracture. 2. No acute intracranial findings. 3. No acute facial bone fractures. 4. 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 Electronically Signed   By: Rudie Meyer M.D.   On: 09/10/2019 12:23   CT Cervical Spine Wo Contrast  Result Date: 09/10/2019 CLINICAL DATA:  Larey Seat.  Hit head. EXAM: CT HEAD WITHOUT CONTRAST CT MAXILLOFACIAL WITHOUT CONTRAST CT CERVICAL SPINE WITHOUT CONTRAST TECHNIQUE: Multidetector CT imaging of the head, cervical spine, and maxillofacial structures were performed using the standard protocol without intravenous contrast. Multiplanar CT image reconstructions of the cervical spine and maxillofacial structures were also generated. COMPARISON:  Head CT 01/23/2016 FINDINGS: CT HEAD FINDINGS Brain: The ventricles are in the midline without mass effect or shift. They are normal in size and configuration given the degree of cerebral  atrophy. No acute intracranial findings such as hemispheric infarction or intracranial hemorrhage. No extra-axial fluid collections are identified. Vascular: Scattered vascular calcifications. No aneurysm or hyperdense vessels. Skull: No skull fracture.  No bone lesions. Other: There is a moderate-sized left frontoparietal scalp hematoma and laceration with a small amount of air in the soft tissues. No radiopaque foreign body. No underlying skull fracture. CT MAXILLOFACIAL FINDINGS Osseous: No fracture or mandibular dislocation. No destructive process. Orbits: Negative. No traumatic or inflammatory finding. Sinuses: The paranasal sinuses and mastoid air cells are clear except for a tiny amount of fluid in the right maxillary sinus. Soft tissues: Left frontoparietal scalp hematoma. No facial hematomas. 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 3 mm of posterior subluxation of C5 noted. Skull base and vertebrae: No acute fractures are identified. Soft tissues and spinal canal: No prevertebral fluid or swelling. No visible canal hematoma. Disc levels: The spinal canal is fairly generous. No significant spinal stenosis. Moderate bony foraminal narrowing at C5-6 due to uncinate spurring and facet disease. Upper chest: The lung apices are grossly clear. Marked tortuosity, ectasia and calcification of the thoracic aorta. Other: Bilateral carotid artery calcifications are noted. IMPRESSION: 1. Left frontal parietal scalp hematoma and laceration but no radiopaque  foreign body or underlying skull fracture. 2. No acute intracranial findings. 3. No acute facial bone fractures. 4. 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 Electronically Signed   By: Rudie MeyerP.  Gallerani M.D.   On: 09/10/2019 12:23   DG Chest  Portable 1 View  Result Date: 09/10/2019 CLINICAL DATA:  79 year old female with history of syncope. Fall. Evaluate for infiltrates. EXAM: PORTABLE CHEST 1 VIEW COMPARISON:  Chest x-ray 08/14/2018. FINDINGS: New areas of diffuse interstitial prominence noted throughout the lungs bilaterally. No confluent consolidative airspace disease. No pneumothorax. No pleural effusions. No evidence of pulmonary edema. Heart size is normal. Upper mediastinal contours are within normal limits. Aortic atherosclerosis. IMPRESSION: 1. Diffuse interstitial prominence new compared to prior studies. Clinical correlation for signs and symptoms of atypical infection including viral pneumonia is recommended. Electronically Signed   By: Trudie Reedaniel  Entrikin M.D.   On: 09/10/2019 11:36   CT Maxillofacial Wo Contrast  Result Date: 09/10/2019 CLINICAL DATA:  Larey SeatFell.  Hit head. EXAM: CT HEAD WITHOUT CONTRAST CT MAXILLOFACIAL WITHOUT CONTRAST CT CERVICAL SPINE WITHOUT CONTRAST TECHNIQUE: Multidetector CT imaging of the head, cervical spine, and maxillofacial structures were performed using the standard protocol without intravenous contrast. Multiplanar CT image reconstructions of the cervical spine and maxillofacial structures were also generated. COMPARISON:  Head CT 01/23/2016 FINDINGS: CT HEAD FINDINGS Brain: The ventricles are in the midline without mass effect or shift. They are normal in size and configuration given the degree of cerebral atrophy. No acute intracranial findings such as hemispheric infarction or intracranial hemorrhage. No extra-axial fluid collections are identified. Vascular: Scattered vascular calcifications. No aneurysm or hyperdense vessels. Skull: No skull fracture.  No bone lesions. Other: There is a moderate-sized left frontoparietal scalp hematoma and laceration with a small amount of air in the soft tissues. No radiopaque foreign body. No underlying skull fracture. CT MAXILLOFACIAL FINDINGS Osseous: No fracture or  mandibular dislocation. No destructive process. Orbits: Negative. No traumatic or inflammatory finding. Sinuses: The paranasal sinuses and mastoid air cells are clear except for a tiny amount of fluid in the right maxillary sinus. Soft tissues: Left frontoparietal scalp hematoma. No facial hematomas. 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 3 mm of posterior subluxation of C5 noted. Skull base and vertebrae: No acute fractures are identified. Soft tissues and spinal canal: No prevertebral fluid or swelling. No visible canal hematoma. Disc levels: The spinal canal is fairly generous. No significant spinal stenosis. Moderate bony foraminal narrowing at C5-6 due to uncinate spurring and facet disease. Upper chest: The lung apices are grossly clear. Marked tortuosity, ectasia and calcification of the thoracic aorta. Other: Bilateral carotid artery calcifications are noted. IMPRESSION: 1. Left frontal parietal scalp hematoma and laceration but no radiopaque foreign body or underlying skull fracture. 2. No acute intracranial findings. 3. No acute facial bone fractures. 4. 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 Electronically Signed   By: Rudie MeyerP.  Gallerani M.D.   On: 09/10/2019 12:23   IMPRESSION AND PLAN:  [age] Y F with history of chronic back pain, hypertension, hypothyroidism, depression, attention deficit/hyperactivity disorder admitted for syncope   * Syncope - she doesn't recall the event - r/o seizure & neuro etio - neuro c/s - carotid dopplers, EEG, mri brain - TSH, Orthostatic vitals, echo - PT eval -  Cardio c/s -> some reports of V.Tach en route. Now in NSR, monitor on tele  * Hypertension: monitor on home meds  * Depression: continue home meds   COVID neg    All the records  are reviewed and case discussed with ED provider. Management plans discussed with the patient, nursing and they are in agreement.  CODE STATUS: FULL CODE  TOTAL TIME TAKING CARE OF THIS PATIENT: 45 minutes.    Max Sane M.D on 09/10/2019 at 4:43 PM  [facility]   CC: Primary care physician; Lynnell Jude, MD   Note: This dictation was prepared with Dragon dictation along with smaller phrase technology. Any transcriptional errors that result from this process are unintentional.

## 2019-09-10 NOTE — ED Notes (Signed)
C-collar removed by MD Roxan Hockey.

## 2019-09-10 NOTE — Evaluation (Signed)
Physical Therapy Evaluation Patient Details Name: Jenna Powers MRN: [id number] DOB: May 29, 1941 Today's Date: 09/10/2019   History of Present Illness  Per MD notes: Pt is an 79 y.o. female with past medical history that includes CVA, HLD, HTN, neuropathy, thyroid disease, tremor, and R ankle Sx presented to the ER after syncopal event fall hitting the left side of her head.  Patient does not remember what caused her to fall.  MD assessment includes: syncope and collapse, facial laceration, and reported ventricular tachycardia.    Clinical Impression  Pt pleasant and motivated to participate during the session.  Pt did not require physical assist with bed mobility tasks and demonstrated fair eccentric and concentric control during sit to/from stand transfers.  Pt presented with min instability during amb without an AD drifting left/right and reaching out for UE support while walking.  Pt has had a history of falls over the last year that she attributed to losing her balance but declined recommendation to assess stability with amb with the use of a RW.  Pt eduction provided regarding fall risk with amb without a RW with pt again refusing to attempt amb with a walker.  Pt will benefit from HHPT services upon discharge to safely address deficits listed in patient problem list for decreased fall risk and decreased caregiver assistance.    Follow Up Recommendations Home health PT;Supervision for mobility/OOB    Equipment Recommendations  Other (comment)(Pt declines recommendation for RW)    Recommendations for Other Services       Precautions / Restrictions Precautions Precautions: Fall Restrictions Weight Bearing Restrictions: No      Mobility  Bed Mobility Overal bed mobility: Independent                Transfers Overall transfer level: Needs assistance Equipment used: None Transfers: Sit to/from Stand Sit to Stand: Supervision             Ambulation/Gait Ambulation/Gait assistance: Min guard Gait Distance (Feet): 40 Feet x 2 Assistive device: None Gait Pattern/deviations: Step-through pattern;Decreased step length - right;Decreased step length - left;Drifts right/left Gait velocity: decreased   General Gait Details: Min instability with drifting left/right and reaching for UE support but pt refused to attempt amb with a RW to assess stability with device  Stairs            Wheelchair Mobility    Modified Rankin (Stroke Patients Only)       Balance Overall balance assessment: Needs assistance   Sitting balance-Leahy Scale: Normal     Standing balance support: No upper extremity supported Standing balance-Leahy Scale: Fair                               Pertinent Vitals/Pain Pain Assessment: 0-10 Pain Score: 7  Pain Location: HA Pain Descriptors / Indicators: Aching Pain Intervention(s): Patient requesting pain meds-RN notified    Home Living Family/patient expects to be discharged to:: Private residence Living Arrangements: Alone Available Help at Discharge: Family;Available PRN/intermittently Type of Home: House Home Access: Stairs to enter Entrance Stairs-Rails: Right Entrance Stairs-Number of Steps: 5 Home Layout: Two level Home Equipment: Cane - single point      Prior Function Level of Independence: Independent         Comments: Ind amb without an AD, Ind with ADLs, drives, 3 other falls in the last year secondary to "lose my balance".     Hand Dominance  Extremity/Trunk Assessment   Upper Extremity Assessment Upper Extremity Assessment: Overall WFL for tasks assessed    Lower Extremity Assessment Lower Extremity Assessment: Generalized weakness       Communication   Communication: No difficulties  Cognition Arousal/Alertness: Awake/alert Behavior During Therapy: WFL for tasks assessed/performed Overall Cognitive Status: Within Functional Limits  for tasks assessed                                        General Comments      Exercises Other Exercises Other Exercises: Static and dynamic standing balance training with reaching outside BOS   Assessment/Plan    PT Assessment Patient needs continued PT services  PT Problem List Decreased strength;Decreased activity tolerance;Decreased balance;Decreased knowledge of use of DME;Decreased safety awareness       PT Treatment Interventions DME instruction;Gait training;Stair training;Functional mobility training;Therapeutic activities;Therapeutic exercise;Balance training;Patient/family education    PT Goals (Current goals can be found in the Care Plan section)  Acute Rehab PT Goals Patient Stated Goal: To get back home PT Goal Formulation: With patient Time For Goal Achievement: 09/23/19 Potential to Achieve Goals: Good    Frequency Min 2X/week   Barriers to discharge        Co-evaluation               AM-PAC PT "6 Clicks" Mobility  Outcome Measure Help needed turning from your back to your side while in a flat bed without using bedrails?: None Help needed moving from lying on your back to sitting on the side of a flat bed without using bedrails?: None Help needed moving to and from a bed to a chair (including a wheelchair)?: A Little Help needed standing up from a chair using your arms (e.g., wheelchair or bedside chair)?: A Little Help needed to walk in hospital room?: A Little Help needed climbing 3-5 steps with a railing? : A Little 6 Click Score: 20    End of Session Equipment Utilized During Treatment: Gait belt Activity Tolerance: Patient tolerated treatment well Patient left: in bed;with call bell/phone within reach Nurse Communication: Mobility status PT Visit Diagnosis: Unsteadiness on feet (R26.81);History of falling (Z91.81);Difficulty in walking, not elsewhere classified (R26.2);Muscle weakness (generalized) (M62.81)    Time:  1620-1650 PT Time Calculation (min) (ACUTE ONLY): 30 min   Charges:   PT Evaluation $PT Eval Moderate Complexity: 1 Mod PT Treatments $Therapeutic Exercise: 8-22 mins        D. Elly Modena PT, DPT 09/10/19, 5:30 PM

## 2019-09-11 DIAGNOSIS — F329 Major depressive disorder, single episode, unspecified: Secondary | ICD-10-CM | POA: Diagnosis not present

## 2019-09-11 DIAGNOSIS — R55 Syncope and collapse: Secondary | ICD-10-CM | POA: Diagnosis not present

## 2019-09-11 LAB — GLUCOSE, CAPILLARY
Glucose-Capillary: 89 mg/dL (ref 70–99)
Glucose-Capillary: 99 mg/dL (ref 70–99)

## 2019-09-11 LAB — COMPREHENSIVE METABOLIC PANEL
ALT: 34 U/L (ref 0–44)
AST: 57 U/L — ABNORMAL HIGH (ref 15–41)
Albumin: 3.8 g/dL (ref 3.5–5.0)
Alkaline Phosphatase: 72 U/L (ref 38–126)
Anion gap: 11 (ref 5–15)
BUN: 22 mg/dL (ref 8–23)
CO2: 26 mmol/L (ref 22–32)
Calcium: 9.3 mg/dL (ref 8.9–10.3)
Chloride: 102 mmol/L (ref 98–111)
Creatinine, Ser: 1.13 mg/dL — ABNORMAL HIGH (ref 0.44–1.00)
GFR calc Af Amer: 53 mL/min — ABNORMAL LOW (ref >60)
GFR calc non Af Amer: 46 mL/min — ABNORMAL LOW (ref >60)
Glucose, Bld: 91 mg/dL (ref 70–99)
Potassium: 3.6 mmol/L (ref 3.5–5.1)
Sodium: 139 mmol/L (ref 135–145)
Total Bilirubin: 0.9 mg/dL (ref 0.3–1.2)
Total Protein: 6.7 g/dL (ref 6.5–8.1)

## 2019-09-11 LAB — CBC
HCT: 41.7 % (ref 36.0–46.0)
Hemoglobin: 13.6 g/dL (ref 12.0–15.0)
MCH: 31 pg (ref 26.0–34.0)
MCHC: 32.6 g/dL (ref 30.0–36.0)
MCV: 95 fL (ref 80.0–100.0)
Platelets: 279 10*3/uL (ref 150–400)
RBC: 4.39 MIL/uL (ref 3.87–5.11)
RDW: 12.7 % (ref 11.5–15.5)
WBC: 8.6 10*3/uL (ref 4.0–10.5)
nRBC: 0 % (ref 0.0–0.2)

## 2019-09-11 MED ORDER — PROPRANOLOL HCL ER 80 MG PO CP24: ORAL_CAPSULE | ORAL | Status: DC

## 2019-09-11 NOTE — TOC Initial Note (Signed)
Transition of Care (TOC) - Initial/Assessment Note    Patient Details  Name: Jenna Powers MRN: [id number] Date of Birth: 08/06/1941  Transition of Care (TOC) CM/SW Contact:     A , RN Phone Number: 09/11/2019, 9:25 AM  Clinical Narrative:                  Met with patient to discuss HH recommendation and assessment.  Patient states she lives at home and is able to care for her self, if she needs anything she calls her son.  She refuses recommendation for RW, saying she won't use it and does not want HH PT services at this time.  She states she has an exercise program she does as home and she feels she just tripped and fell last night and does not want any additional services at this time.  No further TOC needs at this time, please re-consult for new needs.   Expected Discharge Plan: Home/Self Care Barriers to Discharge: Continued Medical Work up   Patient Goals and CMS Choice   CMS Medicare.gov Compare Post Acute Care list provided to:: Patient Choice offered to / list presented to : Patient  Expected Discharge Plan and Services Expected Discharge Plan: Home/Self Care   Discharge Planning Services: CM Consult Post Acute Care Choice: NA Living arrangements for the past 2 months: Single Family Home                                      Prior Living Arrangements/Services Living arrangements for the past 2 months: Single Family Home Lives with:: Self Patient language and need for interpreter reviewed:: Yes Do you feel safe going back to the place where you live?: Yes      Need for Family Participation in Patient Care: No (Comment) Care giver support system in place?: Yes (comment)   Criminal Activity/Legal Involvement Pertinent to Current Situation/Hospitalization: No - Comment as needed  Activities of Daily Living Home Assistive Devices/Equipment: None ADL Screening (condition at time of admission) Patient's cognitive ability adequate to safely  complete daily activities?: Yes Is the patient deaf or have difficulty hearing?: No Does the patient have difficulty seeing, even when wearing glasses/contacts?: No Does the patient have difficulty concentrating, remembering, or making decisions?: No Patient able to express need for assistance with ADLs?: Yes Does the patient have difficulty dressing or bathing?: No Independently performs ADLs?: Yes (appropriate for developmental age) Does the patient have difficulty walking or climbing stairs?: No Weakness of Legs: None Weakness of Arms/Hands: None  Permission Sought/Granted                  Emotional Assessment Appearance:: Appears stated age Attitude/Demeanor/Rapport: Engaged Affect (typically observed): Appropriate Orientation: : Oriented to Self, Oriented to Place, Oriented to  Time, Oriented to Situation Alcohol / Substance Use: Not Applicable Psych Involvement: No (comment)  Admission diagnosis:  Syncope and collapse [R55] Syncope [R55] Facial laceration, initial encounter [S01.81XA] Patient Active Problem List   Diagnosis Date Noted  . Syncope 09/10/2019   PCP:  Bliss, Laura K, MD Pharmacy:   [id number] - [place], [place] - [address] [place] [address] Phone: [phone] Fax: [phone]     Social Determinants of Health (SDOH) Interventions    Readmission Risk Interventions No flowsheet data found.  

## 2019-09-11 NOTE — Discharge Summary (Signed)
Physician Discharge Summary   Jenna Powers  female DOB: 02-24-1941  IRW:[id number]  PCP: Dortha Kern, MD  Admit date: 09/10/2019 Discharge date: 09/11/2019  Admitted From: home Disposition:  home Home Health: Pt refused recommendation for RW, saying she won't use it and does not want HH PT services at this time.   CODE STATUS: Full code  Discharge Instructions    Diet - low sodium heart healthy   Complete by: As directed    Increase activity slowly   Complete by: As directed        Hospital Course:  For full details, please see H&P, progress notes, consult notes and ancillary notes.  Briefly,  Jenna Powers  is a 79 y.o. female with a known history of CVA, chronic back pain, neuropathy, recurrent falls, depression, hypothyroidism, history of ADHD, hypertension, migraine headaches who was admitted for syncope.  Reportedly patient was walking to the bathroom and woke up with blood "on my head" s/p fall. She had no warning she was not sure how long she was out and she suffered an injury to the left side of her head with some bleeding. E DPreported run of V. tachenroute by EMS and given amiodarone which was stopped in ED as no strips to confirm.  While in ED patient was in NSR.    # Syncope, unknown etiology MRI of the brain showed no acute changes. Carotid dopplers showed no evidence of hemodynamically significant stenosis.  Echocardiogram shows EF of 65-70%, Grade I diastolic dysfunction, and no shunt.  Spot EEG was performed which was normal.    # Hypertension Continued on home BP meds, however, home propranolol was held since heart rate was already low in 50's without this.  # Depression continued home meds   Discharge Diagnoses:  Active Problems:   Syncope    Discharge Instructions:  Allergies as of 09/11/2019   No Known Allergies     Medication List    TAKE these medications   albuterol 108 (90 Base) MCG/ACT inhaler Commonly known as: VENTOLIN  HFA Inhale 1-2 puffs into the lungs every 6 (six) hours as needed for wheezing or shortness of breath. Use with spacer   amLODipine 10 MG tablet Commonly known as: NORVASC Take 10 mg by mouth daily.   aspirin 81 MG EC tablet Take 81 mg by mouth daily.   clopidogrel 75 MG tablet Commonly known as: PLAVIX Take 75 mg by mouth daily.   FLUoxetine 40 MG capsule Commonly known as: PROZAC Take 40 mg by mouth daily.   hydrochlorothiazide 25 MG tablet Commonly known as: HYDRODIURIL Take 25 mg by mouth daily.   HYDROcodone-acetaminophen 5-325 MG tablet Commonly known as: NORCO/VICODIN Take 1 tablet by mouth every 6 (six) hours as needed for moderate pain.   hydrOXYzine 25 MG tablet Commonly known as: ATARAX/VISTARIL Take 25 mg by mouth 4 (four) times daily as needed.   levothyroxine 25 MCG tablet Commonly known as: SYNTHROID Take 25 mcg by mouth every morning.   lisinopril 40 MG tablet Commonly known as: ZESTRIL Take 40 mg by mouth daily.   oxybutynin 5 MG tablet Commonly known as: DITROPAN Take 5 mg by mouth 2 (two) times daily.   propranolol ER 80 MG 24 hr capsule Commonly known as: INDERAL LA Hold this medication because your heart rate is already low in 50's without this. What changed:   how much to take  how to take this  when to take this  additional instructions  sertraline 100 MG tablet Commonly known as: ZOLOFT Take 100 mg by mouth daily.   tiZANidine 2 MG tablet Commonly known as: ZANAFLEX Take 2 mg by mouth at bedtime as needed.   traZODone 50 MG tablet Commonly known as: DESYREL Take 50 mg by mouth at bedtime.       Follow-up Information    Alwyn Pea, MD. Go on 09/14/2019.   Specialties: Cardiology, Internal Medicine Why: to get your cardiac monitor Contact information: [address] [place] [address] [phone]        Dortha Kern, MD. Schedule an appointment as soon as possible for a visit in 1 week(s).    Specialty: Family Medicine Contact information: [address] [place] [address] [phone]           No Known Allergies   The results of significant diagnostics from this hospitalization (including imaging, microbiology, ancillary and laboratory) are listed below for reference.   Consultations:   Procedures/Studies: CT Head Wo Contrast  Result Date: 09/10/2019 CLINICAL DATA:  Larey Seat.  Hit head. EXAM: CT HEAD WITHOUT CONTRAST CT MAXILLOFACIAL WITHOUT CONTRAST CT CERVICAL SPINE WITHOUT CONTRAST TECHNIQUE: Multidetector CT imaging of the head, cervical spine, and maxillofacial structures were performed using the standard protocol without intravenous contrast. Multiplanar CT image reconstructions of the cervical spine and maxillofacial structures were also generated. COMPARISON:  Head CT 01/23/2016 FINDINGS: CT HEAD FINDINGS Brain: The ventricles are in the midline without mass effect or shift. They are normal in size and configuration given the degree of cerebral atrophy. No acute intracranial findings such as hemispheric infarction or intracranial hemorrhage. No extra-axial fluid collections are identified. Vascular: Scattered vascular calcifications. No aneurysm or hyperdense vessels. Skull: No skull fracture.  No bone lesions. Other: There is a moderate-sized left frontoparietal scalp hematoma and laceration with a small amount of air in the soft tissues. No radiopaque foreign body. No underlying skull fracture. CT MAXILLOFACIAL FINDINGS Osseous: No fracture or mandibular dislocation. No destructive process. Orbits: Negative. No traumatic or inflammatory finding. Sinuses: The paranasal sinuses and mastoid air cells are clear except for a tiny amount of fluid in the right maxillary sinus. Soft tissues: Left frontoparietal scalp hematoma. No facial hematomas. 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 3 mm of posterior subluxation of C5 noted. Skull base and vertebrae: No acute fractures are identified. Soft tissues and spinal canal: No prevertebral fluid or swelling. No visible canal hematoma. Disc levels: The spinal canal is fairly generous. No significant spinal stenosis. Moderate bony foraminal narrowing at C5-6 due to uncinate spurring and facet disease. Upper chest: The lung apices are grossly clear. Marked tortuosity, ectasia and calcification of the thoracic aorta. Other: Bilateral carotid artery calcifications are noted. IMPRESSION: 1. Left frontal parietal scalp hematoma and laceration but no radiopaque foreign body or underlying skull fracture. 2. No acute intracranial findings. 3. No acute facial bone fractures. 4. 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 Electronically Signed   By: Rudie Meyer M.D.   On: 09/10/2019 12:23   CT Cervical Spine Wo Contrast  Result Date: 09/10/2019 CLINICAL DATA:  Larey Seat.  Hit head. EXAM: CT HEAD WITHOUT CONTRAST CT MAXILLOFACIAL WITHOUT CONTRAST CT CERVICAL SPINE WITHOUT CONTRAST TECHNIQUE: Multidetector CT imaging of the head, cervical spine, and maxillofacial structures were performed using the standard protocol without intravenous contrast.  Multiplanar CT image reconstructions of the cervical spine and maxillofacial structures were also generated. COMPARISON:  Head CT 01/23/2016 FINDINGS: CT HEAD FINDINGS Brain: The ventricles are in the midline without mass effect or shift. They are normal in size and configuration given the degree of cerebral atrophy. No acute intracranial findings such as hemispheric infarction or intracranial hemorrhage. No extra-axial fluid collections are identified. Vascular: Scattered vascular calcifications. No aneurysm or hyperdense vessels. Skull: No skull fracture.  No bone lesions. Other: There is a  moderate-sized left frontoparietal scalp hematoma and laceration with a small amount of air in the soft tissues. No radiopaque foreign body. No underlying skull fracture. CT MAXILLOFACIAL FINDINGS Osseous: No fracture or mandibular dislocation. No destructive process. Orbits: Negative. No traumatic or inflammatory finding. Sinuses: The paranasal sinuses and mastoid air cells are clear except for a tiny amount of fluid in the right maxillary sinus. Soft tissues: Left frontoparietal scalp hematoma. No facial hematomas. 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 3 mm of posterior subluxation of C5 noted. Skull base and vertebrae: No acute fractures are identified. Soft tissues and spinal canal: No prevertebral fluid or swelling. No visible canal hematoma. Disc levels: The spinal canal is fairly generous. No significant spinal stenosis. Moderate bony foraminal narrowing at C5-6 due to uncinate spurring and facet disease. Upper chest: The lung apices are grossly clear. Marked tortuosity, ectasia and calcification of the thoracic aorta. Other: Bilateral carotid artery calcifications are noted. IMPRESSION: 1. Left frontal parietal scalp hematoma and laceration but no radiopaque foreign body or underlying skull fracture. 2. No acute intracranial findings. 3. No acute facial bone fractures. 4. 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 Electronically Signed   By: Rudie MeyerP.  Gallerani M.D.   On: 09/10/2019 12:23   MR BRAIN WO CONTRAST  Result Date: 09/10/2019 CLINICAL DATA:  Syncope EXAM: MRI HEAD WITHOUT CONTRAST TECHNIQUE: Multiplanar, multiecho pulse sequences of the brain and surrounding structures were obtained without intravenous contrast. COMPARISON:  06/14/2013 brain MRI, head CT 09/10/2019 FINDINGS:  BRAIN: No acute infarct, acute hemorrhage or extra-axial collection. Normal white matter signal for age. There is generalized atrophy without lobar predilection. Midline structures are normal. VASCULAR: Major flow voids are preserved. Susceptibility-sensitive sequences show no chronic microhemorrhage or superficial siderosis. SKULL AND UPPER CERVICAL SPINE: Normal calvarium and skull base. Left frontoparietal scalp hematoma. SINUSES/ORBITS: No fluid levels or advanced mucosal thickening. No mastoid or middle ear effusion. Normal orbits. IMPRESSION: 1. No acute intracranial process. 2. Generalized atrophy without lobar predilection. 3. Left frontoparietal scalp hematoma. Electronically Signed   By: Deatra RobinsonKevin  Herman M.D.   On: 09/10/2019 23:11   US Carotid Bilateral  Result Date: 09/10/2019 CLINICAL DATA:  Syncope EXAM: BILATERAL CAROTID DUPLEX ULTRASOUND TECHNIQUE: Wallace CullensGray scale imaging, color Doppler and duplex ultrasound were performed of bilateral carotid and vertebral arteries in the neck. COMPARISON:  None. FINDINGS: Criteria: Quantification of carotid stenosis is based on velocity parameters that correlate the residual internal carotid diameter with NASCET-based stenosis levels, using the diameter of the distal internal carotid lumen as the denominator for stenosis measurement. The following velocity measurements were obtained: RIGHT ICA: 64 cm/sec CCA: 47 cm/sec SYSTOLIC ICA/CCA RATIO:  1.4 ECA: 86 cm/sec LEFT ICA: 107 cm/sec CCA: 56 cm/sec SYSTOLIC ICA/CCA RATIO:  1.9 ECA: 111 cm/sec RIGHT CAROTID ARTERY: There is minimal calcified and noncalcified plaque involving  the left CCA. There is minimal atherosclerotic plaque involving the carotid bulb without evidence for stenosis. There is intimal thickening at the origin of right ICA. There is calcified plaque within the mid right ICA resulting in less than 50% stenosis by grayscale and color Doppler imaging. RIGHT VERTEBRAL ARTERY:  Antegrade flow is noted. LEFT  CAROTID ARTERY: There is a minimal amount of plaque within the left CCA. There is a minimal amount of atherosclerotic plaque within the carotid bulb. Mild calcified and noncalcified plaque is noted at the origin of the left ICA resulting in less than 50% stenosis. There is noncalcified plaque within the midportion of the left ICA resulting in around 50% stenosis. LEFT VERTEBRAL ARTERY:  Antegrade flow is noted IMPRESSION: 1. Less than 50% stenosis of the bilateral ICAs. 2. Mild atherosclerotic plaque bilaterally as detailed above. 3. Antegrade flow is noted within both vertebral arteries. Electronically Signed   By: Katherine Mantle M.D.   On: 09/10/2019 22:59   DG Chest Portable 1 View  Result Date: 09/10/2019 CLINICAL DATA:  79 year old female with history of syncope. Fall. Evaluate for infiltrates. EXAM: PORTABLE CHEST 1 VIEW COMPARISON:  Chest x-ray 08/14/2018. FINDINGS: New areas of diffuse interstitial prominence noted throughout the lungs bilaterally. No confluent consolidative airspace disease. No pneumothorax. No pleural effusions. No evidence of pulmonary edema. Heart size is normal. Upper mediastinal contours are within normal limits. Aortic atherosclerosis. IMPRESSION: 1. Diffuse interstitial prominence new compared to prior studies. Clinical correlation for signs and symptoms of atypical infection including viral pneumonia is recommended. Electronically Signed   By: Trudie Reed M.D.   On: 09/10/2019 11:36   EEG adult  Result Date: 09/11/2019 Thana Farr, MD     09/11/2019  1:54 PM ELECTROENCEPHALOGRAM REPORT Patient: Jenna Powers       Room #: 255A-AA EEG No. ID: [id number] Age: 79 y.o.        Sex: female Referring Physician: Fran Lowes Report Date:  09/11/2019       Interpreting Physician: Thana Farr History: Haddie Bruhl is an 79 y.o. female with syncope Medications: Prozac, HCTZ, Cozaar, Ditropan, Tramadol, Desyrel Conditions of Recording:  This is a 21 channel routine scalp  EEG performed with bipolar and monopolar montages arranged in accordance to the international 10/20 system of electrode placement. One channel was dedicated to EKG recording. The patient is in the awake and drowsy states. Description:  The waking background activity consists of a low voltage, symmetrical, fairly well organized, 9 Hz alpha activity, seen from the parieto-occipital and posterior temporal regions.  Low voltage fast activity, poorly organized, is seen anteriorly and is at times superimposed on more posterior regions.  A mixture of theta and alpha rhythms are seen from the central and temporal regions. The patient drowses with slowing to irregular, low voltage theta and beta activity.  Stage II sleep is not obtained. No epileptiform activity is noted.  Hyperventilation was not performed.  Intermittent photic stimulation was performed but failed to illicit any change in the tracing. IMPRESSION: Normal electroencephalogram, awake, drowsy and with activation procedures. There are no focal lateralizing or epileptiform features. Thana Farr, MD Neurology [phone] 09/11/2019, 1:52 PM   ECHOCARDIOGRAM COMPLETE  Result Date: 09/10/2019   ECHOCARDIOGRAM REPORT   Patient Name:   Jenna Powers Date of Exam: 09/10/2019 Medical Rec #:  [id number]         Height:       68.0 in Accession #:    [id number]  Weight:       140.0 lb Date of Birth:  Dec 04, 1940         BSA:          1.76 m Patient Age:    78 years          BP:           158/77 mmHg Patient Gender: F                 HR:           66 bpm. Exam Location:  [facility] Procedure: 2D Echo Indications:     Syncope 780.2/R55  History:         Patient has no prior history of Echocardiogram examinations.                  Risk Factors:Hypertension and Dyslipidemia.  Sonographer:     Johnathan Hausen Referring Phys:  [id number] VIPUL [facility] Diagnosing Phys: Alwyn Pea MD IMPRESSIONS  1. Left ventricular ejection fraction, by visual estimation, is 65 to 70%.  The left ventricle has normal function. Normal left ventricular posterior wall thickness. There is no left ventricular hypertrophy.  2. Left ventricular diastolic parameters are consistent with Grade I diastolic dysfunction (impaired relaxation).  3. The left ventricle demonstrates regional wall motion abnormalities.  4. Global right ventricle has normal systolic function.The right ventricular size is normal. No increase in right ventricular wall thickness.  5. Left atrial size was normal.  6. Right atrial size was normal.  7. Mild mitral valve prolapse.  8. The mitral valve is normal in structure. Trivial mitral valve regurgitation.  9. The tricuspid valve is normal in structure. 10. The tricuspid valve is normal in structure. Tricuspid valve regurgitation is moderate. 11. The aortic valve is normal in structure. Aortic valve regurgitation is not visualized. 12. The pulmonic valve was grossly normal. Pulmonic valve regurgitation is not visualized. 13. Mildly elevated pulmonary artery systolic pressure. 14. Left ventricular ejection fraction by PLAX is is 69 % 15. The atrial septum is grossly normal. FINDINGS  Left Ventricle: Left ventricular ejection fraction, by visual estimation, is 65 to 70%. Left ventricular ejection fraction by PLAX is is 69 % The left ventricle has normal function. The left ventricle demonstrates regional wall motion abnormalities. Normal left ventricular posterior wall thickness. There is no left ventricular hypertrophy. Left ventricular diastolic parameters are consistent with Grade I diastolic dysfunction (impaired relaxation). Right Ventricle: The right ventricular size is normal. No increase in right ventricular wall thickness. Global RV systolic function is has normal systolic function. The tricuspid regurgitant velocity is 3.70 m/s, and with an assumed right atrial pressure  of 10 mmHg, the estimated right ventricular systolic pressure is mildly elevated at 64.8 mmHg. Left Atrium: Left  atrial size was normal in size. Right Atrium: Right atrial size was normal in size Pericardium: There is no evidence of pericardial effusion. Mitral Valve: The mitral valve is normal in structure. There is mild prolapse of of the mitral valve. Trivial mitral valve regurgitation. Tricuspid Valve: The tricuspid valve is normal in structure. Tricuspid valve regurgitation is moderate. Aortic Valve: The aortic valve is normal in structure. Aortic valve regurgitation is not visualized. Pulmonic Valve: The pulmonic valve was grossly normal. Pulmonic valve regurgitation is not visualized. Pulmonic regurgitation is not visualized. Aorta: The aortic root is normal in size and structure. IAS/Shunts: The atrial septum is grossly normal.  LEFT VENTRICLE PLAX 2D LV EF:  Left            Diastology                ventricular     LV e' lateral:   10.80 cm/s                ejection        LV E/e' lateral: 8.9                fraction by                PLAX is is                69 % LVIDd:         4.30 cm LVIDs:         2.65 cm LV PW:         1.11 cm LV IVS:        1.14 cm LVOT diam:     1.90 cm LV SV:         57 ml LV SV Index:   32.85 LVOT Area:     2.84 cm  RIGHT VENTRICLE             IVC RV S prime:     12.60 cm/s  IVC diam: 1.63 cm LEFT ATRIUM             Index       RIGHT ATRIUM           Index LA diam:        3.80 cm 2.16 cm/m  RA Area:     18.10 cm LA Vol (A2C):   87.2 ml 49.65 ml/m RA Volume:   50.00 ml  28.47 ml/m LA Vol (A4C):   57.3 ml 32.63 ml/m LA Biplane Vol: 75.9 ml 43.22 ml/m   AORTA Ao Root diam: 2.80 cm MITRAL VALVE                        TRICUSPID VALVE MV Area (PHT): 3.37 cm             TR Peak grad:   54.8 mmHg MV PHT:        65.25 msec           TR Vmax:        395.00 cm/s MV Decel Time: 225 msec MV E velocity: 96.00 cm/s 103 cm/s  SHUNTS MV A velocity: 43.20 cm/s 70.3 cm/s Systemic Diam: 1.90 cm MV E/A ratio:  2.22       1.5  Alwyn Peawayne D Callwood MD Electronically signed by Alwyn Peawayne D Callwood MD  Signature Date/Time: 09/10/2019/10:26:29 PM    Final    CT Maxillofacial Wo Contrast  Result Date: 09/10/2019 CLINICAL DATA:  Larey SeatFell.  Hit head. EXAM: CT HEAD WITHOUT CONTRAST CT MAXILLOFACIAL WITHOUT CONTRAST CT CERVICAL SPINE WITHOUT CONTRAST TECHNIQUE: Multidetector CT imaging of the head, cervical spine, and maxillofacial structures were performed using the standard protocol without intravenous contrast. Multiplanar CT image reconstructions of the cervical spine and maxillofacial structures were also generated. COMPARISON:  Head CT 01/23/2016 FINDINGS: CT HEAD FINDINGS Brain: The ventricles are in the midline without mass effect or shift. They are normal in size and configuration given the degree of cerebral atrophy. No acute intracranial findings such as hemispheric infarction or intracranial hemorrhage. No extra-axial fluid collections are identified. Vascular: Scattered vascular calcifications. No aneurysm or hyperdense vessels. Skull: No skull fracture.  No  bone lesions. Other: There is a moderate-sized left frontoparietal scalp hematoma and laceration with a small amount of air in the soft tissues. No radiopaque foreign body. No underlying skull fracture. CT MAXILLOFACIAL FINDINGS Osseous: No fracture or mandibular dislocation. No destructive process. Orbits: Negative. No traumatic or inflammatory finding. Sinuses: The paranasal sinuses and mastoid air cells are clear except for a tiny amount of fluid in the right maxillary sinus. Soft tissues: Left frontoparietal scalp hematoma. No facial hematomas. 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 3 mm of posterior subluxation of C5 noted. Skull base and vertebrae: No acute fractures are identified. Soft tissues and spinal canal: No prevertebral fluid or swelling. No visible canal hematoma. Disc levels: The spinal canal is  fairly generous. No significant spinal stenosis. Moderate bony foraminal narrowing at C5-6 due to uncinate spurring and facet disease. Upper chest: The lung apices are grossly clear. Marked tortuosity, ectasia and calcification of the thoracic aorta. Other: Bilateral carotid artery calcifications are noted. IMPRESSION: 1. Left frontal parietal scalp hematoma and laceration but no radiopaque foreign body or underlying skull fracture. 2. No acute intracranial findings. 3. No acute facial bone fractures. 4. 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 Electronically Signed   By: Rudie Meyer M.D.   On: 09/10/2019 12:23      Labs: BNP (last 3 results) Recent Labs    09/10/19 1329  BNP 145.0*   Basic Metabolic Panel: Recent Labs  Lab 09/11/19 0552  NA 139  K 3.6  CL 102  CO2 26  GLUCOSE 91  BUN 22  CREATININE 1.13*  CALCIUM 9.3   Liver Function Tests: Recent Labs  Lab 09/11/19 0552  AST 57*  ALT 34  ALKPHOS 72  BILITOT 0.9  PROT 6.7  ALBUMIN 3.8   No results for input(s): LIPASE, AMYLASE in the last 168 hours. No results for input(s): AMMONIA in the last 168 hours. CBC: Recent Labs  Lab 09/11/19 0552  WBC 8.6  HGB 13.6  HCT 41.7  MCV 95.0  PLT 279   Cardiac Enzymes: No results for input(s): CKTOTAL, CKMB, CKMBINDEX, TROPONINI in the last 168 hours. BNP: Invalid input(s): POCBNP CBG: Recent Labs  Lab 09/11/19 0546 09/11/19 0811  GLUCAP 89 99   D-Dimer No results for input(s): DDIMER in the last 72 hours. Hgb A1c No results for input(s): HGBA1C in the last 72 hours. Lipid Profile No results for input(s): CHOL, HDL, LDLCALC, TRIG, CHOLHDL, LDLDIRECT in the last 72 hours. Thyroid function studies No results for input(s): TSH, T4TOTAL, T3FREE, THYROIDAB in the last 72 hours.  Invalid input(s): FREET3 Anemia work up No results for input(s): VITAMINB12, FOLATE, FERRITIN, TIBC,  IRON, RETICCTPCT in the last 72 hours. Urinalysis    Component Value Date/Time   COLORURINE Yellow 06/13/2013 1729   APPEARANCEUR Hazy 06/13/2013 1729   LABSPEC 1.013 06/13/2013 1729   PHURINE 5.0 06/13/2013 1729   GLUCOSEU Negative 06/13/2013 1729   HGBUR Negative 06/13/2013 1729   BILIRUBINUR Negative 06/13/2013 1729   KETONESUR Negative 06/13/2013 1729   PROTEINUR Negative 06/13/2013 1729   NITRITE Negative 06/13/2013 1729   LEUKOCYTESUR Trace 06/13/2013 1729   Sepsis Labs Invalid input(s): PROCALCITONIN,  WBC,  LACTICIDVEN Microbiology Recent Results (from the past 240 hour(s))  Respiratory Panel by RT PCR (Flu A&B, Covid) - Nasopharyngeal Swab  Status: None   Collection Time: 09/10/19  1:29 PM   Specimen: Nasopharyngeal Swab  Result Value Ref Range Status   SARS Coronavirus 2 by RT PCR NEGATIVE NEGATIVE Final    Comment: (NOTE) SARS-CoV-2 target nucleic acids are NOT DETECTED. The SARS-CoV-2 RNA is generally detectable in upper respiratoy specimens during the acute phase of infection. The lowest concentration of SARS-CoV-2 viral copies this assay can detect is 131 copies/mL. A negative result does not preclude SARS-Cov-2 infection and should not be used as the sole basis for treatment or other patient management decisions. A negative result may occur with  improper specimen collection/handling, submission of specimen other than nasopharyngeal swab, presence of viral mutation(s) within the areas targeted by this assay, and inadequate number of viral copies (<131 copies/mL). A negative result must be combined with clinical observations, patient history, and epidemiological information. The expected result is Negative. Fact Sheet for Patients:  PinkCheek.be Fact Sheet for Healthcare Providers:  GravelBags.it This test is not yet ap proved or cleared by the [place] FDA and  has been authorized for detection  and/or diagnosis of SARS-CoV-2 by FDA under an Emergency Use Authorization (EUA). This EUA will remain  in effect (meaning this test can be used) for the duration of the COVID-19 declaration under Section 564(b)(1) of the Act, 21 U.S.C. section 360bbb-3(b)(1), unless the authorization is terminated or revoked sooner.    Influenza A by PCR NEGATIVE NEGATIVE Final   Influenza B by PCR NEGATIVE NEGATIVE Final    Comment: (NOTE) The Xpert Xpress SARS-CoV-2/FLU/RSV assay is intended as an aid in  the diagnosis of influenza from Nasopharyngeal swab specimens and  should not be used as a sole basis for treatment. Nasal washings and  aspirates are unacceptable for Xpert Xpress SARS-CoV-2/FLU/RSV  testing. Fact Sheet for Patients: PinkCheek.be Fact Sheet for Healthcare Providers: GravelBags.it This test is not yet approved or cleared by the [place] FDA and  has been authorized for detection and/or diagnosis of SARS-CoV-2 by  FDA under an Emergency Use Authorization (EUA). This EUA will remain  in effect (meaning this test can be used) for the duration of the  Covid-19 declaration under Section 564(b)(1) of the Act, 21  U.S.C. section 360bbb-3(b)(1), unless the authorization is  terminated or revoked. Performed at [facility], [address] [place], [place] [address]      Total time spend on discharging this patient, including the last patient exam, discussing the hospital stay, instructions for ongoing care as it relates to all pertinent caregivers, as well as preparing the medical discharge records, prescriptions, and/or referrals as applicable, is 40 minutes.    Enzo Bi, MD  [facility] 09/18/2019, 2:55 AM  If 7PM-7AM, please contact night-coverage

## 2019-09-11 NOTE — Consult Note (Signed)
Reason for Consult:Syncope Referring Physician: Fran LowesLai  CC: Syncope  HPI: Quinn PlowmanGloria Van Savage is an [age] y.o. female with a history of HLD and HTN who was walking to the bathroom and woke up with blood "on my head" s/p fall. She had no warning she is not sure how long she was out and she suffered an injury to the left side of her head with some bleeding.  Does not reports ant premonitory symptoms.  EDPreported run of V. tach enroute by EMS and given amiodarone which was stopped in ED as no strips to confirm. While in ED patient was in NSR.   Past Medical History:  Diagnosis Date  . Depression   . Hyperlipemia   . Hypertension   . Neuropathy   . Thyroid disease   . Tremor     Past Surgical History:  Procedure Laterality Date  . ABDOMINAL HYSTERECTOMY    . ANKLE SURGERY Right     Family history: Father with HTN, HLD and diabetes, now deceased..  Sister with DM.  Social History:  reports that she has been smoking cigarettes. She has been smoking about 0.15 packs per day. She has never used smokeless tobacco. She reports current alcohol use. She reports that she does not use drugs.  No Known Allergies  Medications:  I have reviewed the patient's current medications. Prior to Admission:  Medications Prior to Admission  Medication Sig Dispense Refill Last Dose  . amLODipine (NORVASC) 10 MG tablet Take 10 mg by mouth daily.   09/10/2019 at 0800  . aspirin 81 MG EC tablet Take 81 mg by mouth daily.   09/10/2019 at 0800  . clopidogrel (PLAVIX) 75 MG tablet Take 75 mg by mouth daily.   09/10/2019 at 0800  . hydrOXYzine (ATARAX/VISTARIL) 25 MG tablet Take 25 mg by mouth 4 (four) times daily as needed.   09/10/2019 at 0800  . levothyroxine (SYNTHROID) 25 MCG tablet Take 25 mcg by mouth every morning.   09/10/2019 at 0700  . lisinopril (ZESTRIL) 40 MG tablet Take 40 mg by mouth daily.   09/10/2019 at 0800  . propranolol ER (INDERAL LA) 80 MG 24 hr capsule Take 80 mg by mouth daily.   09/10/2019 at 0800   . sertraline (ZOLOFT) 100 MG tablet Take 100 mg by mouth daily.   09/10/2019 at 0800  . tiZANidine (ZANAFLEX) 2 MG tablet Take 2 mg by mouth at bedtime as needed.   09/09/2019 at 2000  . traZODone (DESYREL) 50 MG tablet Take 50 mg by mouth at bedtime.   09/09/2019 at 2000  . albuterol (PROVENTIL HFA;VENTOLIN HFA) 108 (90 Base) MCG/ACT inhaler Inhale 1-2 puffs into the lungs every 6 (six) hours as needed for wheezing or shortness of breath. Use with spacer 1 Inhaler 0 prn at prn  . FLUoxetine (PROZAC) 40 MG capsule Take 40 mg by mouth daily.   Not Taking at Unknown time  . hydrochlorothiazide (HYDRODIURIL) 25 MG tablet Take 25 mg by mouth daily.   Not Taking at Unknown time  . HYDROcodone-acetaminophen (NORCO/VICODIN) 5-325 MG tablet Take 1 tablet by mouth every 6 (six) hours as needed for moderate pain.    prn at prn  . oxybutynin (DITROPAN) 5 MG tablet Take 5 mg by mouth 2 (two) times daily.    prn at prn   Scheduled: . enoxaparin (LOVENOX) injection  40 mg Subcutaneous Q24H  . FLUoxetine  40 mg Oral Daily  . losartan  50 mg Oral Daily   And  .  hydrochlorothiazide  12.5 mg Oral Daily  . oxybutynin  5 mg Oral BID  . sodium chloride flush  3 mL Intravenous Q12H  . traMADol  50 mg Oral BID  . traZODone  50 mg Oral QHS    ROS: History obtained from the patient  General ROS: negative for - chills, fatigue, fever, night sweats, weight gain or weight loss Psychological ROS: negative for - behavioral disorder, hallucinations, memory difficulties, mood swings or suicidal ideation Ophthalmic ROS: negative for - blurry vision, double vision, eye pain or loss of vision ENT ROS: negative for - epistaxis, nasal discharge, oral lesions, sore throat, tinnitus or vertigo Allergy and Immunology ROS: negative for - hives or itchy/watery eyes Hematological and Lymphatic ROS: negative for - bleeding problems, bruising or swollen lymph nodes Endocrine ROS: negative for - galactorrhea, hair pattern changes,  polydipsia/polyuria or temperature intolerance Respiratory ROS: negative for - cough, hemoptysis, shortness of breath or wheezing Cardiovascular ROS: negative for - chest pain, dyspnea on exertion, edema or irregular heartbeat Gastrointestinal ROS: negative for - abdominal pain, diarrhea, hematemesis, nausea/vomiting or stool incontinence Genito-Urinary ROS: negative for - dysuria, hematuria, incontinence or urinary frequency/urgency Musculoskeletal ROS: negative for - joint swelling or muscular weakness Neurological ROS: as noted in HPI Dermatological ROS: negative for rash and skin lesion changes  Physical Examination: Blood pressure (!) 164/94, pulse (!) 52, temperature 98.4 F (36.9 C), temperature source Oral, resp. rate 18, height 5\' 6"  (1.676 m), weight 64.6 kg, SpO2 98 %.  HEENT-  Left facial hematoma.  Normal auditory canals and external ears. Normal external nose, mucus membranes and septum.  Normal pharynx. Cardiovascular- S1, S2 normal, pulses palpable throughout   Lungs- chest clear, no wheezing, rales, normal symmetric air entry Abdomen- soft, non-tender; bowel sounds normal; no masses,  no organomegaly Extremities- no edema Lymph-no adenopathy palpable Musculoskeletal-no joint tenderness, deformity or swelling Skin-warm and dry, no hyperpigmentation, vitiligo, or suspicious lesions  Neurological Examination   Mental Status: Alert, some difficulty with date but knew President, year and location.  Speech fluent without evidence of aphasia.  Able to follow 3 step commands without difficulty. Cranial Nerves: II: Visual fields grossly normal, pupils equal, round, reactive to light and accommodation III,IV, VI: ptosis not present, extra-ocular motions intact bilaterally V,VII: smile symmetric, facial light touch sensation normal bilaterally VIII: hearing normal bilaterally IX,X: gag reflex present XI: bilateral shoulder shrug XII: midline tongue extension Motor: Right  : Upper extremity   5/5    Left:     Upper extremity   5/5  Lower extremity   5/5     Lower extremity   5/5 Tone and bulk:normal tone throughout; no atrophy noted Sensory: Pinprick and light touch intact throughout, bilaterally Deep Tendon Reflexes: Symmetric throughout Plantars: Right: mute   Left: mute Cerebellar: Normal finger-to-nose and normal heel-to-shin testing bilaterally Gait: not tested due to safety concerns   Laboratory Studies:   Basic Metabolic Panel: Recent Labs  Lab 09/10/19 1131 09/10/19 1743 09/11/19 0552  NA 140  --  139  K 4.3  --  3.6  CL 103  --  102  CO2 28  --  26  GLUCOSE 122*  --  91  BUN 26*  --  22  CREATININE 1.27* 1.08* 1.13*  CALCIUM 9.4  --  9.3    Liver Function Tests: Recent Labs  Lab 09/10/19 1131 09/11/19 0552  AST 40 57*  ALT 25 34  ALKPHOS 72 72  BILITOT 0.9 0.9  PROT 6.7 6.7  ALBUMIN 3.9 3.8   No results for input(s): LIPASE, AMYLASE in the last 168 hours. No results for input(s): AMMONIA in the last 168 hours.  CBC: Recent Labs  Lab 09/10/19 1131 09/10/19 1743 09/11/19 0552  WBC 9.3 11.0* 8.6  NEUTROABS 6.5  --   --   HGB 13.5 14.5 13.6  HCT 41.6 44.5 41.7  MCV 97.2 95.9 95.0  PLT 279 310 279    Cardiac Enzymes: No results for input(s): CKTOTAL, CKMB, CKMBINDEX, TROPONINI in the last 168 hours.  BNP: Invalid input(s): POCBNP  CBG: Recent Labs  Lab 09/10/19 1142 09/11/19 0546 09/11/19 0811  GLUCAP 124* 89 99    Microbiology: Results for orders placed or performed during the hospital encounter of 09/10/19  Respiratory Panel by RT PCR (Flu A&B, Covid) - Nasopharyngeal Swab     Status: None   Collection Time: 09/10/19  1:29 PM   Specimen: Nasopharyngeal Swab  Result Value Ref Range Status   SARS Coronavirus 2 by RT PCR NEGATIVE NEGATIVE Final    Comment: (NOTE) SARS-CoV-2 target nucleic acids are NOT DETECTED. The SARS-CoV-2 RNA is generally detectable in upper respiratoy specimens during the acute  phase of infection. The lowest concentration of SARS-CoV-2 viral copies this assay can detect is 131 copies/mL. A negative result does not preclude SARS-Cov-2 infection and should not be used as the sole basis for treatment or other patient management decisions. A negative result may occur with  improper specimen collection/handling, submission of specimen other than nasopharyngeal swab, presence of viral mutation(s) within the areas targeted by this assay, and inadequate number of viral copies (<131 copies/mL). A negative result must be combined with clinical observations, patient history, and epidemiological information. The expected result is Negative. Fact Sheet for Patients:  https://www.moore.com/ Fact Sheet for Healthcare Providers:  https://www.young.biz/ This test is not yet ap proved or cleared by the [place] FDA and  has been authorized for detection and/or diagnosis of SARS-CoV-2 by FDA under an Emergency Use Authorization (EUA). This EUA will remain  in effect (meaning this test can be used) for the duration of the COVID-19 declaration under Section 564(b)(1) of the Act, 21 U.S.C. section 360bbb-3(b)(1), unless the authorization is terminated or revoked sooner.    Influenza A by PCR NEGATIVE NEGATIVE Final   Influenza B by PCR NEGATIVE NEGATIVE Final    Comment: (NOTE) The Xpert Xpress SARS-CoV-2/FLU/RSV assay is intended as an aid in  the diagnosis of influenza from Nasopharyngeal swab specimens and  should not be used as a sole basis for treatment. Nasal washings and  aspirates are unacceptable for Xpert Xpress SARS-CoV-2/FLU/RSV  testing. Fact Sheet for Patients: https://www.moore.com/ Fact Sheet for Healthcare Providers: https://www.young.biz/ This test is not yet approved or cleared by the [place] FDA and  has been authorized for detection and/or diagnosis of SARS-CoV-2 by   FDA under an Emergency Use Authorization (EUA). This EUA will remain  in effect (meaning this test can be used) for the duration of the  Covid-19 declaration under Section 564(b)(1) of the Act, 21  U.S.C. section 360bbb-3(b)(1), unless the authorization is  terminated or revoked. Performed at [facility], [address] [place], [place] [address]     Coagulation Studies: No results for input(s): LABPROT, INR in the last 72 hours.  Urinalysis: No results for input(s): COLORURINE, LABSPEC, PHURINE, GLUCOSEU, HGBUR, BILIRUBINUR, KETONESUR, PROTEINUR, UROBILINOGEN, NITRITE, LEUKOCYTESUR in the last 168 hours.  Invalid input(s): APPERANCEUR  Lipid Panel:     Component Value  Date/Time   CHOL 217 (H) 06/14/2013 0125   TRIG 245 (H) 06/14/2013 0125   HDL 44 06/14/2013 0125   VLDL 49 (H) 06/14/2013 0125   LDLCALC 124 (H) 06/14/2013 0125    HgbA1C:  Lab Results  Component Value Date   HGBA1C 5.6 09/10/2019    Urine Drug Screen:      Component Value Date/Time   LABOPIA NEGATIVE 06/13/2013 1729   COCAINSCRNUR NEGATIVE 06/13/2013 1729   LABBENZ NEGATIVE 06/13/2013 1729   AMPHETMU NEGATIVE 06/13/2013 1729   THCU NEGATIVE 06/13/2013 1729   LABBARB NEGATIVE 06/13/2013 1729    Alcohol Level: No results for input(s): ETH in the last 168 hours.  Other results: EKG: sinus rhythm at 63 bpm.  Imaging: CT Head Wo Contrast  Result Date: 09/10/2019 CLINICAL DATA:  Larey Seat.  Hit head. EXAM: CT HEAD WITHOUT CONTRAST CT MAXILLOFACIAL WITHOUT CONTRAST CT CERVICAL SPINE WITHOUT CONTRAST TECHNIQUE: Multidetector CT imaging of the head, cervical spine, and maxillofacial structures were performed using the standard protocol without intravenous contrast. Multiplanar CT image reconstructions of the cervical spine and maxillofacial structures were also generated. COMPARISON:  Head CT 01/23/2016 FINDINGS: CT HEAD FINDINGS Brain: The ventricles are in the midline without mass effect or shift.  They are normal in size and configuration given the degree of cerebral atrophy. No acute intracranial findings such as hemispheric infarction or intracranial hemorrhage. No extra-axial fluid collections are identified. Vascular: Scattered vascular calcifications. No aneurysm or hyperdense vessels. Skull: No skull fracture.  No bone lesions. Other: There is a moderate-sized left frontoparietal scalp hematoma and laceration with a small amount of air in the soft tissues. No radiopaque foreign body. No underlying skull fracture. CT MAXILLOFACIAL FINDINGS Osseous: No fracture or mandibular dislocation. No destructive process. Orbits: Negative. No traumatic or inflammatory finding. Sinuses: The paranasal sinuses and mastoid air cells are clear except for a tiny amount of fluid in the right maxillary sinus. Soft tissues: Left frontoparietal scalp hematoma. No facial hematomas. 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 3 mm of posterior subluxation of C5 noted. Skull base and vertebrae: No acute fractures are identified. Soft tissues and spinal canal: No prevertebral fluid or swelling. No visible canal hematoma. Disc levels: The spinal canal is fairly generous. No significant spinal stenosis. Moderate bony foraminal narrowing at C5-6 due to uncinate spurring and facet disease. Upper chest: The lung apices are grossly clear. Marked tortuosity, ectasia and calcification of the thoracic aorta. Other: Bilateral carotid artery calcifications are noted. IMPRESSION: 1. Left frontal parietal scalp hematoma and laceration but no radiopaque foreign body or underlying skull fracture. 2. No acute intracranial findings. 3. No acute facial bone fractures. 4. 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 Electronically  Signed   By: Rudie Meyer M.D.   On: 09/10/2019 12:23   CT Cervical Spine Wo Contrast  Result Date: 09/10/2019 CLINICAL DATA:  Larey Seat.  Hit head. EXAM: CT HEAD WITHOUT CONTRAST CT MAXILLOFACIAL WITHOUT CONTRAST CT CERVICAL SPINE WITHOUT CONTRAST TECHNIQUE: Multidetector CT imaging of the head, cervical spine, and maxillofacial structures were performed using the standard protocol without intravenous contrast. Multiplanar CT image reconstructions of the cervical spine and maxillofacial structures were also generated. COMPARISON:  Head CT 01/23/2016 FINDINGS: CT HEAD FINDINGS Brain: The ventricles are in the midline without mass effect or shift. They are normal in size and  configuration given the degree of cerebral atrophy. No acute intracranial findings such as hemispheric infarction or intracranial hemorrhage. No extra-axial fluid collections are identified. Vascular: Scattered vascular calcifications. No aneurysm or hyperdense vessels. Skull: No skull fracture.  No bone lesions. Other: There is a moderate-sized left frontoparietal scalp hematoma and laceration with a small amount of air in the soft tissues. No radiopaque foreign body. No underlying skull fracture. CT MAXILLOFACIAL FINDINGS Osseous: No fracture or mandibular dislocation. No destructive process. Orbits: Negative. No traumatic or inflammatory finding. Sinuses: The paranasal sinuses and mastoid air cells are clear except for a tiny amount of fluid in the right maxillary sinus. Soft tissues: Left frontoparietal scalp hematoma. No facial hematomas. 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 3 mm of posterior subluxation of C5 noted. Skull base and vertebrae: No acute fractures are identified. Soft tissues and spinal canal: No prevertebral fluid or swelling. No visible canal hematoma. Disc levels: The spinal canal is  fairly generous. No significant spinal stenosis. Moderate bony foraminal narrowing at C5-6 due to uncinate spurring and facet disease. Upper chest: The lung apices are grossly clear. Marked tortuosity, ectasia and calcification of the thoracic aorta. Other: Bilateral carotid artery calcifications are noted. IMPRESSION: 1. Left frontal parietal scalp hematoma and laceration but no radiopaque foreign body or underlying skull fracture. 2. No acute intracranial findings. 3. No acute facial bone fractures. 4. 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 Electronically Signed   By: Rudie Meyer M.D.   On: 09/10/2019 12:23   MR BRAIN WO CONTRAST  Result Date: 09/10/2019 CLINICAL DATA:  Syncope EXAM: MRI HEAD WITHOUT CONTRAST TECHNIQUE: Multiplanar, multiecho pulse sequences of the brain and surrounding structures were obtained without intravenous contrast. COMPARISON:  06/14/2013 brain MRI, head CT 09/10/2019 FINDINGS: BRAIN: No acute infarct, acute hemorrhage or extra-axial collection. Normal white matter signal for age. There is generalized atrophy without lobar predilection. Midline structures are normal. VASCULAR: Major flow voids are preserved. Susceptibility-sensitive sequences show no chronic microhemorrhage or superficial siderosis. SKULL AND UPPER CERVICAL SPINE: Normal calvarium and skull base. Left frontoparietal scalp hematoma. SINUSES/ORBITS: No fluid levels or advanced mucosal thickening. No mastoid or middle ear effusion. Normal orbits. IMPRESSION: 1. No acute intracranial process. 2. Generalized atrophy without lobar predilection. 3. Left frontoparietal scalp hematoma. Electronically Signed   By: Deatra Robinson M.D.   On: 09/10/2019 23:11   US Carotid Bilateral  Result Date: 09/10/2019 CLINICAL DATA:  Syncope EXAM: BILATERAL CAROTID DUPLEX ULTRASOUND TECHNIQUE: Wallace Cullens scale imaging, color Doppler and duplex ultrasound  were performed of bilateral carotid and vertebral arteries in the neck. COMPARISON:  None. FINDINGS: Criteria: Quantification of carotid stenosis is based on velocity parameters that correlate the residual internal carotid diameter with NASCET-based stenosis levels, using the diameter of the distal internal carotid lumen as the denominator for stenosis measurement. The following velocity measurements were obtained: RIGHT ICA: 64 cm/sec CCA: 47 cm/sec SYSTOLIC ICA/CCA RATIO:  1.4 ECA: 86 cm/sec LEFT ICA: 107 cm/sec CCA: 56 cm/sec SYSTOLIC ICA/CCA RATIO:  1.9 ECA: 111 cm/sec RIGHT CAROTID ARTERY: There is minimal calcified and noncalcified plaque involving the left CCA. There is minimal atherosclerotic plaque involving the carotid bulb without evidence for stenosis. There is intimal thickening at the origin of right ICA. There is calcified plaque within the mid right ICA resulting in less than 50% stenosis  by grayscale and color Doppler imaging. RIGHT VERTEBRAL ARTERY:  Antegrade flow is noted. LEFT CAROTID ARTERY: There is a minimal amount of plaque within the left CCA. There is a minimal amount of atherosclerotic plaque within the carotid bulb. Mild calcified and noncalcified plaque is noted at the origin of the left ICA resulting in less than 50% stenosis. There is noncalcified plaque within the midportion of the left ICA resulting in around 50% stenosis. LEFT VERTEBRAL ARTERY:  Antegrade flow is noted IMPRESSION: 1. Less than 50% stenosis of the bilateral ICAs. 2. Mild atherosclerotic plaque bilaterally as detailed above. 3. Antegrade flow is noted within both vertebral arteries. Electronically Signed   By: Katherine Mantle M.D.   On: 09/10/2019 22:59   DG Chest Portable 1 View  Result Date: 09/10/2019 CLINICAL DATA:  79 year old female with history of syncope. Fall. Evaluate for infiltrates. EXAM: PORTABLE CHEST 1 VIEW COMPARISON:  Chest x-ray 08/14/2018. FINDINGS: New areas of diffuse interstitial  prominence noted throughout the lungs bilaterally. No confluent consolidative airspace disease. No pneumothorax. No pleural effusions. No evidence of pulmonary edema. Heart size is normal. Upper mediastinal contours are within normal limits. Aortic atherosclerosis. IMPRESSION: 1. Diffuse interstitial prominence new compared to prior studies. Clinical correlation for signs and symptoms of atypical infection including viral pneumonia is recommended. Electronically Signed   By: Trudie Reed M.D.   On: 09/10/2019 11:36   ECHOCARDIOGRAM COMPLETE  Result Date: 09/10/2019   ECHOCARDIOGRAM REPORT   Patient Name:   KALANIE FEWELL SAVAGE Date of Exam: 09/10/2019 Medical Rec #:  [id number]         Height:       68.0 in Accession #:    [id number]        Weight:       140.0 lb Date of Birth:  1941/01/03         BSA:          1.76 m Patient Age:    78 years          BP:           158/77 mmHg Patient Gender: F                 HR:           66 bpm. Exam Location:  [facility] Procedure: 2D Echo Indications:     Syncope 780.2/R55  History:         Patient has no prior history of Echocardiogram examinations.                  Risk Factors:Hypertension and Dyslipidemia.  Sonographer:     Johnathan Hausen Referring Phys:  [id number] VIPUL [facility] Diagnosing Phys: Alwyn Pea MD IMPRESSIONS  1. Left ventricular ejection fraction, by visual estimation, is 65 to 70%. The left ventricle has normal function. Normal left ventricular posterior wall thickness. There is no left ventricular hypertrophy.  2. Left ventricular diastolic parameters are consistent with Grade I diastolic dysfunction (impaired relaxation).  3. The left ventricle demonstrates regional wall motion abnormalities.  4. Global right ventricle has normal systolic function.The right ventricular size is normal. No increase in right ventricular wall thickness.  5. Left atrial size was normal.  6. Right atrial size was normal.  7. Mild mitral valve prolapse.  8. The mitral valve is normal  in structure. Trivial mitral valve regurgitation.  9. The tricuspid valve is normal in structure. 10. The tricuspid valve is normal in structure. Tricuspid valve regurgitation is moderate.  11. The aortic valve is normal in structure. Aortic valve regurgitation is not visualized. 12. The pulmonic valve was grossly normal. Pulmonic valve regurgitation is not visualized. 13. Mildly elevated pulmonary artery systolic pressure. 14. Left ventricular ejection fraction by PLAX is is 69 % 15. The atrial septum is grossly normal. FINDINGS  Left Ventricle: Left ventricular ejection fraction, by visual estimation, is 65 to 70%. Left ventricular ejection fraction by PLAX is is 69 % The left ventricle has normal function. The left ventricle demonstrates regional wall motion abnormalities. Normal left ventricular posterior wall thickness. There is no left ventricular hypertrophy. Left ventricular diastolic parameters are consistent with Grade I diastolic dysfunction (impaired relaxation). Right Ventricle: The right ventricular size is normal. No increase in right ventricular wall thickness. Global RV systolic function is has normal systolic function. The tricuspid regurgitant velocity is 3.70 m/s, and with an assumed right atrial pressure  of 10 mmHg, the estimated right ventricular systolic pressure is mildly elevated at 64.8 mmHg. Left Atrium: Left atrial size was normal in size. Right Atrium: Right atrial size was normal in size Pericardium: There is no evidence of pericardial effusion. Mitral Valve: The mitral valve is normal in structure. There is mild prolapse of of the mitral valve. Trivial mitral valve regurgitation. Tricuspid Valve: The tricuspid valve is normal in structure. Tricuspid valve regurgitation is moderate. Aortic Valve: The aortic valve is normal in structure. Aortic valve regurgitation is not visualized. Pulmonic Valve: The pulmonic valve was grossly normal. Pulmonic valve regurgitation is not visualized.  Pulmonic regurgitation is not visualized. Aorta: The aortic root is normal in size and structure. IAS/Shunts: The atrial septum is grossly normal.  LEFT VENTRICLE PLAX 2D LV EF:         Left            Diastology                ventricular     LV e' lateral:   10.80 cm/s                ejection        LV E/e' lateral: 8.9                fraction by                PLAX is is                69 % LVIDd:         4.30 cm LVIDs:         2.65 cm LV PW:         1.11 cm LV IVS:        1.14 cm LVOT diam:     1.90 cm LV SV:         57 ml LV SV Index:   32.85 LVOT Area:     2.84 cm  RIGHT VENTRICLE             IVC RV S prime:     12.60 cm/s  IVC diam: 1.63 cm LEFT ATRIUM             Index       RIGHT ATRIUM           Index LA diam:        3.80 cm 2.16 cm/m  RA Area:     18.10 cm LA Vol (A2C):   87.2 ml 49.65 ml/m RA Volume:   50.00 ml  28.47  ml/m LA Vol (A4C):   57.3 ml 32.63 ml/m LA Biplane Vol: 75.9 ml 43.22 ml/m   AORTA Ao Root diam: 2.80 cm MITRAL VALVE                        TRICUSPID VALVE MV Area (PHT): 3.37 cm             TR Peak grad:   54.8 mmHg MV PHT:        65.25 msec           TR Vmax:        395.00 cm/s MV Decel Time: 225 msec MV E velocity: 96.00 cm/s 103 cm/s  SHUNTS MV A velocity: 43.20 cm/s 70.3 cm/s Systemic Diam: 1.90 cm MV E/A ratio:  2.22       1.5  Alwyn Peawayne D Callwood MD Electronically signed by Alwyn Peawayne D Callwood MD Signature Date/Time: 09/10/2019/10:26:29 PM    Final    CT Maxillofacial Wo Contrast  Result Date: 09/10/2019 CLINICAL DATA:  Larey SeatFell.  Hit head. EXAM: CT HEAD WITHOUT CONTRAST CT MAXILLOFACIAL WITHOUT CONTRAST CT CERVICAL SPINE WITHOUT CONTRAST TECHNIQUE: Multidetector CT imaging of the head, cervical spine, and maxillofacial structures were performed using the standard protocol without intravenous contrast. Multiplanar CT image reconstructions of the cervical spine and maxillofacial structures were also generated. COMPARISON:  Head CT 01/23/2016 FINDINGS: CT HEAD FINDINGS Brain: The  ventricles are in the midline without mass effect or shift. They are normal in size and configuration given the degree of cerebral atrophy. No acute intracranial findings such as hemispheric infarction or intracranial hemorrhage. No extra-axial fluid collections are identified. Vascular: Scattered vascular calcifications. No aneurysm or hyperdense vessels. Skull: No skull fracture.  No bone lesions. Other: There is a moderate-sized left frontoparietal scalp hematoma and laceration with a small amount of air in the soft tissues. No radiopaque foreign body. No underlying skull fracture. CT MAXILLOFACIAL FINDINGS Osseous: No fracture or mandibular dislocation. No destructive process. Orbits: Negative. No traumatic or inflammatory finding. Sinuses: The paranasal sinuses and mastoid air cells are clear except for a tiny amount of fluid in the right maxillary sinus. Soft tissues: Left frontoparietal scalp hematoma. No facial hematomas. 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 3 mm of posterior subluxation of C5 noted. Skull base and vertebrae: No acute fractures are identified. Soft tissues and spinal canal: No prevertebral fluid or swelling. No visible canal hematoma. Disc levels: The spinal canal is fairly generous. No significant spinal stenosis. Moderate bony foraminal narrowing at C5-6 due to uncinate spurring and facet disease. Upper chest: The lung apices are grossly clear. Marked tortuosity, ectasia and calcification of the thoracic aorta. Other: Bilateral carotid artery calcifications are noted. IMPRESSION: 1. Left frontal parietal scalp hematoma and laceration but no radiopaque foreign body or underlying skull fracture. 2. No acute intracranial findings. 3. No acute facial bone fractures. 4. 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 Electronically Signed   By: Rudie MeyerP.  Gallerani M.D.   On: 09/10/2019 12:23     Assessment/Plan: 79 y.o. female with a history of HLD and HTN presenting after a syncopal episode.  Arrythmia reportedly noted in transit.  Patient with signs of dehydration and orthostatic in work up as well.  TSH mildly elevated.   MRI of the brain reviewed and shows no  acute changes. Carotid dopplers show no evidence of hemodynamically significant stenosis.  Echocardiogram shows EF of 65-70%.   Syncopal event likely multifactorial with a significant contribution from orthostasis.  Seizure remains in the differential as well but if present may very well have been a syncopal seizure and related to hypoperfusion.  Cardiology following patient for possibility of arrhythmia.  Recommendations: 1. Free T4 2. Agree with hydration and addressing of orthostasis 3. EEG pending.  Anticonvulsant therapy not indicated at this time.     Thana Farr, MD Neurology [phone] 09/11/2019, 10:28 AM

## 2019-09-11 NOTE — Progress Notes (Signed)
eeg completed ° °

## 2019-09-11 NOTE — Progress Notes (Signed)
Educated patient on discharge instructions and patient verbalized understanding. Patients son will pick her up when he gets off of work later this afternoon to drive her home.

## 2019-09-11 NOTE — Procedures (Signed)
ELECTROENCEPHALOGRAM REPORT   Patient: Jenna Powers       Room #: 255A-AA EEG No. ID: [id number] Age: 79 y.o.        Sex: female Referring Physician: Fran Lowes Report Date:  09/11/2019        Interpreting Physician: Thana Farr  History: [name] is an 79 y.o. female with syncope  Medications:  Prozac, HCTZ, Cozaar, Ditropan, Tramadol, Desyrel  Conditions of Recording:  This is a 21 channel routine scalp EEG performed with bipolar and monopolar montages arranged in accordance to the international 10/20 system of electrode placement. One channel was dedicated to EKG recording.  The patient is in the awake and drowsy states.  Description:  The waking background activity consists of a low voltage, symmetrical, fairly well organized, 9 Hz alpha activity, seen from the parieto-occipital and posterior temporal regions.  Low voltage fast activity, poorly organized, is seen anteriorly and is at times superimposed on more posterior regions.  A mixture of theta and alpha rhythms are seen from the central and temporal regions. The patient drowses with slowing to irregular, low voltage theta and beta activity.   Stage II sleep is not obtained. No epileptiform activity is noted.   Hyperventilation was not performed.  Intermittent photic stimulation was performed but failed to illicit any change in the tracing.    IMPRESSION: Normal electroencephalogram, awake, drowsy and with activation procedures. There are no focal lateralizing or epileptiform features.   Thana Farr, MD Neurology [phone] 09/11/2019, 1:52 PM

## 2019-10-28 ENCOUNTER — Encounter: Payer: Self-pay | Admitting: Emergency Medicine

## 2019-10-28 ENCOUNTER — Ambulatory Visit
Admission: EM | Admit: 2019-10-28 | Discharge: 2019-10-28 | Disposition: A | Discharge status: Home / Self Care | Payer: Medicare Other | Attending: Emergency Medicine | Admitting: Emergency Medicine

## 2019-10-28 ENCOUNTER — Ambulatory Visit (INDEPENDENT_AMBULATORY_CARE_PROVIDER_SITE_OTHER): Payer: Medicare Other

## 2019-10-28 ENCOUNTER — Other Ambulatory Visit: Payer: Self-pay

## 2019-10-28 DIAGNOSIS — R05 Cough: Secondary | ICD-10-CM | POA: Diagnosis not present

## 2019-10-28 DIAGNOSIS — Z87891 Personal history of nicotine dependence: Secondary | ICD-10-CM | POA: Diagnosis not present

## 2019-10-28 DIAGNOSIS — J441 Chronic obstructive pulmonary disease with (acute) exacerbation: Secondary | ICD-10-CM

## 2019-10-28 MED ORDER — ALBUTEROL SULFATE HFA 108 (90 BASE) MCG/ACT IN AERS: 2.0000 | INHALATION_SPRAY | Freq: Four times a day (QID) | RESPIRATORY_TRACT | 0 refills | Status: DC | PRN

## 2019-10-28 MED ORDER — PREDNISONE 20 MG PO TABS: 40.0000 mg | ORAL_TABLET | Freq: Every day | ORAL | 0 refills | Status: AC

## 2019-10-28 MED ORDER — AEROCHAMBER PLUS MISC: 2 refills | Status: AC

## 2019-10-28 MED ORDER — AMOXICILLIN-POT CLAVULANATE 875-125 MG PO TABS: 1.0000 | ORAL_TABLET | Freq: Two times a day (BID) | ORAL | 0 refills | Status: AC

## 2019-10-28 NOTE — ED Provider Notes (Signed)
HPI  SUBJECTIVE:  Jenna Powers is a 79 y.o. female who presents with a cough present since the beginning of February.  She states it is productive of clear sputum.  She reports upper back pain/soreness secondary to the cough.  She reports malaise, clear nasal congestion, wheezing and night sweats.  She denies fevers, body aches, sinus pain or pressure, postnasal drip, shortness of breath, GERD or allergy symptoms.  No lower extremity edema, orthopnea, PND, nocturia, abdominal pain.  No unintentional weight loss.  No new or different dyspnea on exertion.  Previous records show telephone visit for cough on 2/3, 2/4.  Thought to have post viral cough syndrome was prescribed Tessalon and Ventolin, Delsym, Norco.  Telephone visits again on 2/11, 2/23.  Office visit on 3/1, thought to have a COPD exacerbation was started on Combivent 4 times daily.  Given Promethazine DM as well.  Covid sent and was negative.  Steroid not given at that time because she was due to get second Covid vaccine in several days.  States that she has since gotten it.  She states that the Combivent helped significantly but she has "used it all up".  States that she cannot afford a second prescription of Combivent.  She states that she is not using the albuterol.  She has also tried a cough syrup, allergy pills with improvement in her nasal congestion and an unknown "phlegm pill".  No aggravating factors. Patient is a former smoker estimates that she smoked for total of 30 years.  Quit December 2020.  History of hypertension on ACE inhibitor, COPD, stroke on Plavix with no residual issues.  No history of congestive heart failure MI, diabetes, frequent sinusitis, GERD, allergies.  PMD: Dr. Zada Finders  Past Medical History:  Diagnosis Date  . Depression   . Hyperlipemia   . Hypertension   . Neuropathy   . Thyroid disease   . Tremor     Past Surgical History:  Procedure Laterality Date  . ABDOMINAL HYSTERECTOMY    . ANKLE SURGERY  Right     History reviewed. No pertinent family history.  Social History   Tobacco Use  . Smoking status: Former Smoker    Packs/day: 0.15    Types: Cigarettes  . Smokeless tobacco: Never Used  Substance Use Topics  . Alcohol use: Yes  . Drug use: No    No current facility-administered medications for this encounter.  Current Outpatient Medications:  .  amLODipine (NORVASC) 10 MG tablet, Take 10 mg by mouth daily., Disp: , Rfl:  .  aspirin 81 MG EC tablet, Take 81 mg by mouth daily., Disp: , Rfl:  .  clopidogrel (PLAVIX) 75 MG tablet, Take 75 mg by mouth daily., Disp: , Rfl:  .  FLUoxetine (PROZAC) 40 MG capsule, Take 40 mg by mouth daily., Disp: , Rfl:  .  HYDROcodone-acetaminophen (NORCO/VICODIN) 5-325 MG tablet, Take 1 tablet by mouth every 6 (six) hours as needed for moderate pain. , Disp: , Rfl:  .  hydrOXYzine (ATARAX/VISTARIL) 25 MG tablet, Take 25 mg by mouth 4 (four) times daily as needed., Disp: , Rfl:  .  levothyroxine (SYNTHROID) 25 MCG tablet, Take 25 mcg by mouth every morning., Disp: , Rfl:  .  lisinopril (ZESTRIL) 40 MG tablet, Take 40 mg by mouth daily., Disp: , Rfl:  .  oxybutynin (DITROPAN) 5 MG tablet, Take 5 mg by mouth 2 (two) times daily. , Disp: , Rfl:  .  propranolol ER (INDERAL LA) 80 MG 24 hr  capsule, Hold this medication because your heart rate is already low in 50's without this., Disp: , Rfl:  .  sertraline (ZOLOFT) 100 MG tablet, Take 100 mg by mouth daily., Disp: , Rfl:  .  tiZANidine (ZANAFLEX) 2 MG tablet, Take 2 mg by mouth at bedtime as needed., Disp: , Rfl:  .  traZODone (DESYREL) 50 MG tablet, Take 50 mg by mouth at bedtime., Disp: , Rfl:  .  albuterol (VENTOLIN HFA) 108 (90 Base) MCG/ACT inhaler, Inhale 2 puffs into the lungs every 6 (six) hours as needed for wheezing or shortness of breath. Use with spacer, Disp: 18 g, Rfl: 0 .  amoxicillin-clavulanate (AUGMENTIN) 875-125 MG tablet, Take 1 tablet by mouth 2 (two) times daily for 7 days., Disp:  14 tablet, Rfl: 0 .  hydrochlorothiazide (HYDRODIURIL) 25 MG tablet, Take 25 mg by mouth daily., Disp: , Rfl:  .  predniSONE (DELTASONE) 20 MG tablet, Take 2 tablets (40 mg total) by mouth daily with breakfast for 5 days., Disp: 10 tablet, Rfl: 0 .  Spacer/Aero-Holding Chambers (AEROCHAMBER PLUS) inhaler, Use as instructed, Disp: 1 each, Rfl: 2  No Known Allergies   ROS  As noted in HPI.   Physical Exam  BP (!) 145/100 (BP Location: Right Arm)   Pulse 60   Temp 98.5 F (36.9 C) (Oral)   Resp 18   Ht 5\' 6"  (1.676 m)   Wt 62.6 kg   SpO2 97%   BMI 22.27 kg/m   Constitutional: Well developed, well nourished, no acute distress Eyes:  EOMI, conjunctiva normal bilaterally HENT: Normocephalic, atraumatic,mucus membranes moist.  No nasal congestion.  No maxillary or frontal sinus tenderness.  No postnasal drip. Respiratory: Normal inspiratory effort.  Good air movement.  Diffuse rhonchi, wheezing throughout all lung fields. Cardiovascular: Normal rate regular rhythm no murmurs rubs or gallops GI: nondistended skin: No rash, skin intact Musculoskeletal: no deformities Neurologic: Alert & oriented x 3, no focal neuro deficits Psychiatric: Speech and behavior appropriate   ED Course   Medications - No data to display  Orders Placed This Encounter  Procedures  . DG Chest 2 View    Standing Status:   Standing    Number of Occurrences:   1    Order Specific Question:   Reason for Exam (SYMPTOM  OR DIAGNOSIS REQUIRED)    Answer:   cough x 3 weeks    No results found for this or any previous visit (from the past 24 hour(s)). No results found.  ED Clinical Impression  1. COPD exacerbation [facility])      ED Assessment/Plan  Outside records reviewed.  As noted in HPI.  X-ray report not crossing over.  Discussed with [facility] at [facility] radiology  Reviewed imaging independently.  Decreased interstitial opacities compared to prior radiograph No acute airspace disease.  Mild  cardiomegaly unchanged see radiology report for full details.  Patient with a COPD exacerbation/bronchitis.  Will send home with prednisone 40 mg for 5 days, regularly scheduled albuterol 2 puffs every 4 hours for 2 days, then every 6 hours for 2 days, then as needed.  Home with a spacer for her albuterol inhaler.  Due to duration of symptoms will also send home with Augmentin 875 mg p.o. twice daily for 1 week.  Follow-up with PMD in 3 days.  To the ER if she gets worse.   Discussed  imaging, MDM, treatment plan, and plan for follow-up with patient. Discussed sn/sx that should prompt return to the ED. patient  agrees with plan.   Meds ordered this encounter  Medications  . albuterol (VENTOLIN HFA) 108 (90 Base) MCG/ACT inhaler    Sig: Inhale 2 puffs into the lungs every 6 (six) hours as needed for wheezing or shortness of breath. Use with spacer    Dispense:  18 g    Refill:  0    Provide spacer and instructions to patient  . Spacer/Aero-Holding Chambers (AEROCHAMBER PLUS) inhaler    Sig: Use as instructed    Dispense:  1 each    Refill:  2  . predniSONE (DELTASONE) 20 MG tablet    Sig: Take 2 tablets (40 mg total) by mouth daily with breakfast for 5 days.    Dispense:  10 tablet    Refill:  0  . amoxicillin-clavulanate (AUGMENTIN) 875-125 MG tablet    Sig: Take 1 tablet by mouth 2 (two) times daily for 7 days.    Dispense:  14 tablet    Refill:  0    *This clinic note was created using [employer]. Therefore, there may be occasional mistakes despite careful proofreading.   ?   Melynda Ripple, MD 10/28/19 1758

## 2019-10-28 NOTE — Discharge Instructions (Addendum)
2 puffs from your albuterol inhaler using your spacer every 4 hours for 2 days, then every 6 hours for 2 days, then as needed.  Finish the prednisone and Augmentin, even if you feel better.

## 2019-10-28 NOTE — ED Triage Notes (Signed)
Patient c/o cough that started 3 weeks ago. She states she saw her PCP and he diagnosed her with COPD. She was negative for COVID at that time as well. She was given an inhaler and has already used all of the inhaler. She states she continues to have a cough. Her PCP sent her over here for a possible antibiotic.

## 2020-03-31 ENCOUNTER — Other Ambulatory Visit: Payer: Self-pay

## 2020-03-31 ENCOUNTER — Ambulatory Visit (INDEPENDENT_AMBULATORY_CARE_PROVIDER_SITE_OTHER): Payer: Medicare HMO

## 2020-03-31 ENCOUNTER — Encounter: Payer: Self-pay | Admitting: Emergency Medicine

## 2020-03-31 ENCOUNTER — Ambulatory Visit
Admission: EM | Admit: 2020-03-31 | Discharge: 2020-03-31 | Disposition: A | Discharge status: Home / Self Care | Payer: Medicare HMO | Attending: Family Medicine | Admitting: Family Medicine

## 2020-03-31 DIAGNOSIS — M25571 Pain in right ankle and joints of right foot: Secondary | ICD-10-CM

## 2020-03-31 HISTORY — DX: Cerebral infarction, unspecified: I63.9

## 2020-03-31 MED ORDER — METHYLPREDNISOLONE SODIUM SUCC 40 MG IJ SOLR
80.0000 mg | Freq: Once | INTRAMUSCULAR | Status: AC
  Administered 2020-03-31: 80 mg via INTRAMUSCULAR

## 2020-03-31 NOTE — Discharge Instructions (Signed)
Rest, ice, elevate.  If persists, see surgeon (who did the surgery).  Take care  Dr. Adriana Simas

## 2020-03-31 NOTE — ED Triage Notes (Signed)
Patient in today c/o right ankle pain x 1 month, getting worse. No injury noted. Patient states that she had an ankle replacement x 20 years ago and osteoarthritis and sometimes has pain in her ankle. Patient has taken Tylenol and soaked in Epson salt this morning.

## 2020-04-01 NOTE — ED Provider Notes (Signed)
[facility]    CSN: [id number] Arrival date & time: 03/31/20  1314  History   Chief Complaint Chief Complaint  Patient presents with   Ankle Pain    right   HPI 79 year old female presents with ankle pain.  1 month history of right ankle pain. Has had a prior ankle replacement. No fall, trauma, injury. Pain is getting worse. Associated swelling. Pain 5/10 in severity. Has tired Tylenol and Epson soaks without improvement. No other complaints.   Past Medical History:  Diagnosis Date   Depression    Hyperlipemia    Hypertension    Neuropathy    Stroke [facility])    Thyroid disease    Tremor     Patient Active Problem List   Diagnosis Date Noted   Syncope 09/10/2019    Past Surgical History:  Procedure Laterality Date   ABDOMINAL HYSTERECTOMY     ANKLE SURGERY Right     OB History   No obstetric history on file.      Home Medications    Prior to Admission medications   Medication Sig Start Date End Date Taking? Authorizing Provider  albuterol (VENTOLIN HFA) 108 (90 Base) MCG/ACT inhaler Inhale 2 puffs into the lungs every 6 (six) hours as needed for wheezing or shortness of breath. Use with spacer 10/28/19  Yes Domenick Gong, MD  amLODipine (NORVASC) 10 MG tablet Take 10 mg by mouth daily. 08/28/19  Yes [provider]  clopidogrel (PLAVIX) 75 MG tablet Take 75 mg by mouth daily. 05/28/19  Yes [provider]  hydrochlorothiazide (HYDRODIURIL) 25 MG tablet Take 25 mg by mouth daily.   Yes [provider]  hydrOXYzine (ATARAX/VISTARIL) 25 MG tablet Take 25 mg by mouth 4 (four) times daily as needed. 08/04/19  Yes [provider]  levothyroxine (SYNTHROID) 25 MCG tablet Take 25 mcg by mouth every morning. 07/31/19  Yes [provider]  lisinopril (ZESTRIL) 40 MG tablet Take 40 mg by mouth daily. 07/23/19  Yes [provider]  oxybutynin (DITROPAN) 5 MG tablet Take 5 mg by mouth 2 (two) times  daily.    Yes [provider]  propranolol ER (INDERAL LA) 80 MG 24 hr capsule Hold this medication because your heart rate is already low in 50's without this. 09/11/19  Yes Darlin Priestly, MD  sertraline (ZOLOFT) 100 MG tablet Take 100 mg by mouth daily. 08/27/19  Yes [provider]  Spacer/Aero-Holding Chambers (AEROCHAMBER PLUS) inhaler Use as instructed 10/28/19  Yes Domenick Gong, MD  traZODone (DESYREL) 50 MG tablet Take 50 mg by mouth at bedtime.   Yes [provider]  aspirin 81 MG EC tablet Take 81 mg by mouth daily.    [provider]  FLUoxetine (PROZAC) 40 MG capsule Take 40 mg by mouth daily.    [provider]  HYDROcodone-acetaminophen (NORCO/VICODIN) 5-325 MG tablet Take 1 tablet by mouth every 6 (six) hours as needed for moderate pain.     [provider]  tiZANidine (ZANAFLEX) 2 MG tablet Take 2 mg by mouth at bedtime as needed. 07/12/19   [provider]    Family History Family History  Problem Relation Age of Onset   Pneumonia Mother    Hypertension Father    Heart disease Father    Stroke Father     Social History Social History   Tobacco Use   Smoking status: Former Smoker    Packs/day: 0.15    Types: Cigarettes  Quit date: 07/2019    Years since quitting: 0.6   Smokeless tobacco: Never Used  Vaping Use   Vaping Use: Never used  Substance Use Topics   Alcohol use: Not Currently   Drug use: No     Allergies   Patient has no known allergies.   Review of Systems Review of Systems  Constitutional: Negative.   Musculoskeletal:       Right ankle pain.   Physical Exam Triage Vital Signs ED Triage Vitals  Enc Vitals Group     BP 03/31/20 1526 (!) 204/86     Pulse Rate 03/31/20 1526 (!) 52     Resp 03/31/20 1526 18     Temp 03/31/20 1526 98.2 F (36.8 C)     Temp Source 03/31/20 1526 Oral     SpO2 03/31/20 1526 98 %     Weight 03/31/20 1525 150 lb (68 kg)     Height  03/31/20 1525 5\' 6"  (1.676 m)     Head Circumference --      Peak Flow --      Pain Score 03/31/20 1525 5     Pain Loc --      Pain Edu? --      Excl. in GC? --    Updated Vital Signs BP (!) 206/101 (BP Location: Right Arm)    Pulse (!) 52    Temp 98.2 F (36.8 C) (Oral)    Resp 18    Ht 5\' 6"  (1.676 m)    Wt 68 kg    SpO2 98%    BMI 24.21 kg/m   Visual Acuity Right Eye Distance:   Left Eye Distance:   Bilateral Distance:    Right Eye Near:   Left Eye Near:    Bilateral Near:     Physical Exam Vitals and nursing note reviewed.  Constitutional:      General: She is not in acute distress.    Appearance: Normal appearance. She is not ill-appearing.  HENT:     Head: Normocephalic and atraumatic.  Eyes:     General:        Right eye: No discharge.        Left eye: No discharge.     Conjunctiva/sclera: Conjunctivae normal.  Pulmonary:     Effort: Pulmonary effort is normal. No respiratory distress.  Skin:    Comments: Right ankle - swelling around the medial and lateral malleoli noted.   Neurological:     Mental Status: She is alert.  Psychiatric:        Mood and Affect: Mood normal.        Behavior: Behavior normal.    UC Treatments / Results  Labs (all labs ordered are listed, but only abnormal results are displayed) Labs Reviewed - No data to display  EKG   Radiology DG Ankle Complete Right  Result Date: 03/31/2020 CLINICAL DATA:  Pain, swelling.  Ankle replacement 20 years ago. EXAM: RIGHT ANKLE - COMPLETE 3+ VIEW COMPARISON:  None. FINDINGS: Changes of right tibiotalar ankle replacement. Advanced degenerative changes in the right ankle. Mild diffuse soft tissue swelling. No acute fracture, subluxation or dislocation. IMPRESSION: Prior right ankle replacement. Advanced degenerative changes. No acute bony abnormality. Electronically Signed   By: M.D.   On: 03/31/2020 16:23    Procedures Procedures (including critical care time)  Medications  Ordered in UC Medications  methylPREDNISolone sodium succinate (SOLU-MEDROL) 40 mg/mL injection 80 mg (80 mg Intramuscular Given 03/31/20 1654)  Initial Impression / Assessment and Plan / UC Course  I have reviewed the triage vital signs and the nursing notes.  Pertinent labs & imaging results that were available during my care of the patient were reviewed by me and considered in my medical decision making (see chart for details).    79 year old female presents with right ankle pain. Xray obtained and independently reviewed by me. Interpretation: Ankle replacement noted. Extensive degenerative changes. Solumedrol given today. Follow up with surgeon.   Final Clinical Impressions(s) / UC Diagnoses   Final diagnoses:  Acute right ankle pain     Discharge Instructions     Rest, ice, elevate.  If persists, see surgeon (who did the surgery).  Take care  Dr. Adriana Simas    ED Prescriptions    None     PDMP not reviewed this encounter.   Tommie Sams, DO 04/01/20 2250

## 2020-04-22 ENCOUNTER — Encounter: Payer: Self-pay | Admitting: Emergency Medicine

## 2020-04-22 ENCOUNTER — Other Ambulatory Visit: Payer: Self-pay

## 2020-04-22 ENCOUNTER — Ambulatory Visit
Admission: EM | Admit: 2020-04-22 | Discharge: 2020-04-22 | Disposition: A | Discharge status: Home / Self Care | Payer: Medicare HMO

## 2020-04-22 DIAGNOSIS — H9222 Otorrhagia, left ear: Secondary | ICD-10-CM

## 2020-04-22 NOTE — ED Provider Notes (Signed)
[facility]    CSN: [id number] Arrival date & time: 04/22/20  1422      History   Chief Complaint Chief Complaint  Patient presents with   Ear Problem    HPI Jenna Powers is a 79 y.o. female.   Jenna Powers presents with complaints of blood noted to left ear. She noted it this morning when she woke. This afternoon she again put a qtip in the ear to see that there was still blood present. No pain. No fevers. No URI symptoms. No decreased hearing. Denies any previous similar. No recent swimming/ underwater submersion.    ROS per HPI, negative if not otherwise mentioned.      Past Medical History:  Diagnosis Date   Depression    Hyperlipemia    Hypertension    Neuropathy    Stroke [facility])    Thyroid disease    Tremor     Patient Active Problem List   Diagnosis Date Noted   Syncope 09/10/2019    Past Surgical History:  Procedure Laterality Date   ABDOMINAL HYSTERECTOMY     ANKLE SURGERY Right     OB History   No obstetric history on file.      Home Medications    Prior to Admission medications   Medication Sig Start Date End Date Taking? Authorizing Provider  albuterol (VENTOLIN HFA) 108 (90 Base) MCG/ACT inhaler Inhale 2 puffs into the lungs every 6 (six) hours as needed for wheezing or shortness of breath. Use with spacer 10/28/19  Yes Domenick Gong, MD  amLODipine (NORVASC) 10 MG tablet Take 10 mg by mouth daily. 08/28/19  Yes [provider]  clopidogrel (PLAVIX) 75 MG tablet Take 75 mg by mouth daily. 05/28/19  Yes [provider]  hydrochlorothiazide (HYDRODIURIL) 25 MG tablet Take 25 mg by mouth daily.   Yes [provider]  hydrOXYzine (ATARAX/VISTARIL) 25 MG tablet Take 25 mg by mouth 4 (four) times daily as needed. 08/04/19  Yes [provider]  levothyroxine (SYNTHROID) 25 MCG tablet Take 25 mcg by mouth every morning. 07/31/19  Yes [provider]  lisinopril (ZESTRIL)  40 MG tablet Take 40 mg by mouth daily. 07/23/19  Yes [provider]  oxybutynin (DITROPAN) 5 MG tablet Take 5 mg by mouth 2 (two) times daily.    Yes [provider]  propranolol ER (INDERAL LA) 80 MG 24 hr capsule Hold this medication because your heart rate is already low in 50's without this. 09/11/19  Yes Darlin Priestly, MD  sertraline (ZOLOFT) 100 MG tablet Take 100 mg by mouth daily. 08/27/19  Yes [provider]  traZODone (DESYREL) 50 MG tablet Take 50 mg by mouth at bedtime.   Yes [provider]  aspirin 81 MG EC tablet Take 81 mg by mouth daily.    [provider]  FLUoxetine (PROZAC) 40 MG capsule Take 40 mg by mouth daily.    [provider]  HYDROcodone-acetaminophen (NORCO/VICODIN) 5-325 MG tablet Take 1 tablet by mouth every 6 (six) hours as needed for moderate pain.     [provider]  Spacer/Aero-Holding Chambers (AEROCHAMBER PLUS) inhaler Use as instructed 10/28/19   Domenick Gong, MD  tiZANidine (ZANAFLEX) 2 MG tablet Take 2 mg by mouth at bedtime as needed. 07/12/19   [provider]    Family History Family History  Problem Relation Age of Onset   Pneumonia Mother    Hypertension Father    Heart disease Father  Stroke Father     Social History Social History   Tobacco Use   Smoking status: Former Smoker    Packs/day: 0.15    Types: Cigarettes    Quit date: 07/2019    Years since quitting: 0.7   Smokeless tobacco: Never Used  Vaping Use   Vaping Use: Never used  Substance Use Topics   Alcohol use: Not Currently   Drug use: No     Allergies   Patient has no known allergies.   Review of Systems Review of Systems   Physical Exam Triage Vital Signs ED Triage Vitals  Enc Vitals Group     BP 04/22/20 1441 (!) 164/102     Pulse Rate 04/22/20 1441 61     Resp 04/22/20 1441 18     Temp 04/22/20 1441 98.4 F (36.9 C)     Temp Source 04/22/20 1441 Oral     SpO2 04/22/20  1441 100 %     Weight 04/22/20 1436 149 lb 14.6 oz (68 kg)     Height 04/22/20 1436 5\' 6"  (1.676 m)     Head Circumference --      Peak Flow --      Pain Score 04/22/20 1436 0     Pain Loc --      Pain Edu? --      Excl. in GC? --    No data found.  Updated Vital Signs BP (!) 164/102 (BP Location: Right Arm)    Pulse 61    Temp 98.4 F (36.9 C) (Oral)    Resp 18    Ht 5\' 6"  (1.676 m)    Wt 149 lb 14.6 oz (68 kg)    SpO2 100%    BMI 24.20 kg/m   Visual Acuity Right Eye Distance:   Left Eye Distance:   Bilateral Distance:    Right Eye Near:   Left Eye Near:    Bilateral Near:     Physical Exam Constitutional:      General: She is not in acute distress.    Appearance: She is well-developed.  HENT:     Right Ear: Tympanic membrane and ear canal normal.     Left Ear: Drainage present.     Ears:     Comments: There is blood to left ear canal, occluding the distal aspect of the TM; doesn't appear to be active bleeding; canal non tender and no obvious fissure/ laceration; mass vs wax vs blood accumulation within canal, unfortunately unable to differentiate at this time; small effusion noted to TM which is visible; no redness or swelling Cardiovascular:     Rate and Rhythm: Normal rate.  Pulmonary:     Effort: Pulmonary effort is normal.  Skin:    General: Skin is warm and dry.  Neurological:     Mental Status: She is alert and oriented to person, place, and time.      UC Treatments / Results  Labs (all labs ordered are listed, but only abnormal results are displayed) Labs Reviewed - No data to display  EKG   Radiology No results found.  Procedures Procedures (including critical care time)  Medications Ordered in UC Medications - No data to display  Initial Impression / Assessment and Plan / UC Course  I have reviewed the triage vital signs and the nursing notes.  Pertinent labs & imaging results that were available during my care of the patient were reviewed  by me and considered in my medical decision making (  see chart for details).     TM perforation vs canal abrasion/ fissure. Difficult to examine due to patients canal anatomy, unfortunately, as well as the blood/ wax within the canal. Encouraged to not put anything further into the canal at this time. Follow up with ENT for recheck. Return precautions provided. Patient verbalized understanding and agreeable to plan.   Final Clinical Impressions(s) / UC Diagnoses   Final diagnoses:  Blood in ear canal, left     Discharge Instructions     Don't put anything into the ear.  Please follow up with ENT for recheck in the next few weeks.  Return if worsening, develop pain or fevers.    ED Prescriptions    None     PDMP not reviewed this encounter.   Georgetta Haber, NP 04/22/20 1511

## 2020-04-22 NOTE — ED Triage Notes (Signed)
Pt states she has bright red blood coming from her left ear. She denies pain. She has also had headaches. She denies any pain in her ear.

## 2020-04-22 NOTE — Discharge Instructions (Signed)
Don't put anything into the ear.  Please follow up with ENT for recheck in the next few weeks.  Return if worsening, develop pain or fevers.

## 2021-03-13 ENCOUNTER — Emergency Department: Payer: Medicare HMO

## 2021-03-13 ENCOUNTER — Other Ambulatory Visit: Payer: Self-pay

## 2021-03-13 ENCOUNTER — Inpatient Hospital Stay
Admission: EM | Admit: 2021-03-13 | Discharge: 2021-03-14 | DRG: 065 | Disposition: A | Discharge status: Other Acute Inpatient Hospital | Payer: Medicare HMO | Attending: Internal Medicine | Admitting: Internal Medicine

## 2021-03-13 DIAGNOSIS — E785 Hyperlipidemia, unspecified: Secondary | ICD-10-CM | POA: Diagnosis present

## 2021-03-13 DIAGNOSIS — I609 Nontraumatic subarachnoid hemorrhage, unspecified: Secondary | ICD-10-CM | POA: Diagnosis not present

## 2021-03-13 DIAGNOSIS — I69351 Hemiplegia and hemiparesis following cerebral infarction affecting right dominant side: Secondary | ICD-10-CM

## 2021-03-13 DIAGNOSIS — I6300 Cerebral infarction due to thrombosis of unspecified precerebral artery: Secondary | ICD-10-CM

## 2021-03-13 DIAGNOSIS — I608 Other nontraumatic subarachnoid hemorrhage: Secondary | ICD-10-CM | POA: Diagnosis present

## 2021-03-13 DIAGNOSIS — Z79890 Hormone replacement therapy (postmenopausal): Secondary | ICD-10-CM

## 2021-03-13 DIAGNOSIS — T796XXA Traumatic ischemia of muscle, initial encounter: Secondary | ICD-10-CM | POA: Diagnosis present

## 2021-03-13 DIAGNOSIS — I610 Nontraumatic intracerebral hemorrhage in hemisphere, subcortical: Principal | ICD-10-CM | POA: Diagnosis present

## 2021-03-13 DIAGNOSIS — Z823 Family history of stroke: Secondary | ICD-10-CM

## 2021-03-13 DIAGNOSIS — I619 Nontraumatic intracerebral hemorrhage, unspecified: Secondary | ICD-10-CM

## 2021-03-13 DIAGNOSIS — G8191 Hemiplegia, unspecified affecting right dominant side: Secondary | ICD-10-CM

## 2021-03-13 DIAGNOSIS — R29714 NIHSS score 14: Secondary | ICD-10-CM | POA: Diagnosis present

## 2021-03-13 DIAGNOSIS — Z8249 Family history of ischemic heart disease and other diseases of the circulatory system: Secondary | ICD-10-CM

## 2021-03-13 DIAGNOSIS — Z7982 Long term (current) use of aspirin: Secondary | ICD-10-CM

## 2021-03-13 DIAGNOSIS — I1310 Hypertensive heart and chronic kidney disease without heart failure, with stage 1 through stage 4 chronic kidney disease, or unspecified chronic kidney disease: Secondary | ICD-10-CM | POA: Diagnosis present

## 2021-03-13 DIAGNOSIS — D72829 Elevated white blood cell count, unspecified: Secondary | ICD-10-CM

## 2021-03-13 DIAGNOSIS — W19XXXA Unspecified fall, initial encounter: Secondary | ICD-10-CM

## 2021-03-13 DIAGNOSIS — N1831 Chronic kidney disease, stage 3a: Secondary | ICD-10-CM | POA: Diagnosis present

## 2021-03-13 DIAGNOSIS — R778 Other specified abnormalities of plasma proteins: Secondary | ICD-10-CM | POA: Diagnosis present

## 2021-03-13 DIAGNOSIS — I4510 Unspecified right bundle-branch block: Secondary | ICD-10-CM | POA: Diagnosis present

## 2021-03-13 DIAGNOSIS — N189 Chronic kidney disease, unspecified: Secondary | ICD-10-CM

## 2021-03-13 DIAGNOSIS — Z20822 Contact with and (suspected) exposure to covid-19: Secondary | ICD-10-CM | POA: Diagnosis present

## 2021-03-13 DIAGNOSIS — N179 Acute kidney failure, unspecified: Secondary | ICD-10-CM

## 2021-03-13 DIAGNOSIS — Z90710 Acquired absence of both cervix and uterus: Secondary | ICD-10-CM

## 2021-03-13 DIAGNOSIS — E860 Dehydration: Secondary | ICD-10-CM | POA: Diagnosis present

## 2021-03-13 DIAGNOSIS — I10 Essential (primary) hypertension: Secondary | ICD-10-CM | POA: Diagnosis present

## 2021-03-13 DIAGNOSIS — M6282 Rhabdomyolysis: Secondary | ICD-10-CM

## 2021-03-13 DIAGNOSIS — S81811A Laceration without foreign body, right lower leg, initial encounter: Secondary | ICD-10-CM | POA: Diagnosis present

## 2021-03-13 DIAGNOSIS — Z79899 Other long term (current) drug therapy: Secondary | ICD-10-CM

## 2021-03-13 DIAGNOSIS — F32A Depression, unspecified: Secondary | ICD-10-CM | POA: Diagnosis present

## 2021-03-13 DIAGNOSIS — J439 Emphysema, unspecified: Secondary | ICD-10-CM | POA: Diagnosis present

## 2021-03-13 DIAGNOSIS — Z87891 Personal history of nicotine dependence: Secondary | ICD-10-CM

## 2021-03-13 DIAGNOSIS — G629 Polyneuropathy, unspecified: Secondary | ICD-10-CM | POA: Diagnosis present

## 2021-03-13 DIAGNOSIS — Y92009 Unspecified place in unspecified non-institutional (private) residence as the place of occurrence of the external cause: Secondary | ICD-10-CM

## 2021-03-13 LAB — CBC WITH DIFFERENTIAL/PLATELET
Abs Immature Granulocytes: 0.26 10*3/uL — ABNORMAL HIGH (ref 0.00–0.07)
Basophils Absolute: 0.1 10*3/uL (ref 0.0–0.1)
Basophils Relative: 0 %
Eosinophils Absolute: 0 10*3/uL (ref 0.0–0.5)
Eosinophils Relative: 0 %
HCT: 36.8 % (ref 36.0–46.0)
Hemoglobin: 12.5 g/dL (ref 12.0–15.0)
Immature Granulocytes: 1 %
Lymphocytes Relative: 7 %
Lymphs Abs: 1.7 10*3/uL (ref 0.7–4.0)
MCH: 31.8 pg (ref 26.0–34.0)
MCHC: 34 g/dL (ref 30.0–36.0)
MCV: 93.6 fL (ref 80.0–100.0)
Monocytes Absolute: 2.1 10*3/uL — ABNORMAL HIGH (ref 0.1–1.0)
Monocytes Relative: 9 %
Neutro Abs: 20.1 10*3/uL — ABNORMAL HIGH (ref 1.7–7.7)
Neutrophils Relative %: 83 %
Platelets: 428 10*3/uL — ABNORMAL HIGH (ref 150–400)
RBC: 3.93 MIL/uL (ref 3.87–5.11)
RDW: 12.6 % (ref 11.5–15.5)
WBC: 24.2 10*3/uL — ABNORMAL HIGH (ref 4.0–10.5)
nRBC: 0 % (ref 0.0–0.2)

## 2021-03-13 LAB — COMPREHENSIVE METABOLIC PANEL
ALT: 36 U/L (ref 0–44)
AST: 73 U/L — ABNORMAL HIGH (ref 15–41)
Albumin: 4 g/dL (ref 3.5–5.0)
Alkaline Phosphatase: 87 U/L (ref 38–126)
Anion gap: 10 (ref 5–15)
BUN: 38 mg/dL — ABNORMAL HIGH (ref 8–23)
CO2: 27 mmol/L (ref 22–32)
Calcium: 9.3 mg/dL (ref 8.9–10.3)
Chloride: 97 mmol/L — ABNORMAL LOW (ref 98–111)
Creatinine, Ser: 1.81 mg/dL — ABNORMAL HIGH (ref 0.44–1.00)
GFR, Estimated: 28 mL/min — ABNORMAL LOW (ref >60)
Glucose, Bld: 127 mg/dL — ABNORMAL HIGH (ref 70–99)
Potassium: 3.6 mmol/L (ref 3.5–5.1)
Sodium: 134 mmol/L — ABNORMAL LOW (ref 135–145)
Total Bilirubin: 1.3 mg/dL — ABNORMAL HIGH (ref 0.3–1.2)
Total Protein: 7.2 g/dL (ref 6.5–8.1)

## 2021-03-13 LAB — PROTIME-INR
INR: 1.1 (ref 0.8–1.2)
Prothrombin Time: 13.8 seconds (ref 11.4–15.2)

## 2021-03-13 LAB — MAGNESIUM: Magnesium: 1.9 mg/dL (ref 1.7–2.4)

## 2021-03-13 LAB — RESP PANEL BY RT-PCR (FLU A&B, COVID) ARPGX2
Influenza A by PCR: NEGATIVE
Influenza B by PCR: NEGATIVE
SARS Coronavirus 2 by RT PCR: NEGATIVE

## 2021-03-13 LAB — CK: Total CK: 1390 U/L — ABNORMAL HIGH (ref 38–234)

## 2021-03-13 LAB — TROPONIN I (HIGH SENSITIVITY)
Troponin I (High Sensitivity): 92 ng/L — ABNORMAL HIGH (ref <18)
Troponin I (High Sensitivity): 82 ng/L — ABNORMAL HIGH (ref <18)

## 2021-03-13 LAB — APTT: aPTT: 25 seconds (ref 24–36)

## 2021-03-13 MED ORDER — GADOBUTROL 1 MMOL/ML IV SOLN
6.0000 mL | Freq: Once | INTRAVENOUS | Status: AC | PRN
  Administered 2021-03-14: 6 mL via INTRAVENOUS

## 2021-03-13 MED ORDER — SODIUM CHLORIDE 0.9% FLUSH
3.0000 mL | Freq: Once | INTRAVENOUS | Status: AC
Start: 2021-03-13 — End: 2021-03-13
  Administered 2021-03-13: 3 mL via INTRAVENOUS

## 2021-03-13 MED ORDER — LABETALOL HCL 5 MG/ML IV SOLN
10.0000 mg | INTRAVENOUS | Status: DC | PRN
  Administered 2021-03-14: 10 mg via INTRAVENOUS

## 2021-03-13 MED ORDER — LEVETIRACETAM IN NACL 1000 MG/100ML IV SOLN: 1000.0000 mg | Freq: Once | INTRAVENOUS | Status: DC

## 2021-03-13 MED ORDER — ACETAMINOPHEN 325 MG PO TABS
650.0000 mg | ORAL_TABLET | Freq: Four times a day (QID) | ORAL | Status: DC | PRN
  Administered 2021-03-14: 650 mg via ORAL
  Filled 2021-03-13: qty 2

## 2021-03-13 MED ORDER — SODIUM CHLORIDE 0.9 % IV SOLN: Freq: Once | INTRAVENOUS | Status: AC

## 2021-03-13 MED ORDER — LABETALOL HCL 5 MG/ML IV SOLN
5.0000 mg | Freq: Once | INTRAVENOUS | Status: AC
  Administered 2021-03-13: 5 mg via INTRAVENOUS
  Filled 2021-03-13: qty 4

## 2021-03-13 MED ORDER — LEVETIRACETAM IN NACL 1500 MG/100ML IV SOLN
1500.0000 mg | Freq: Once | INTRAVENOUS | Status: AC
  Administered 2021-03-13: 1500 mg via INTRAVENOUS
  Filled 2021-03-13 (×2): qty 100

## 2021-03-13 MED ORDER — LORAZEPAM 2 MG/ML IJ SOLN
1.0000 mg | Freq: Once | INTRAMUSCULAR | Status: AC
  Administered 2021-03-13: 1 mg via INTRAVENOUS
  Filled 2021-03-13: qty 1

## 2021-03-13 NOTE — Plan of Care (Signed)
I was called by Dr. [name] regarding this patient. Patient has a history of being found down on the ground for the past 2 days with right hemiparesis.  CT head with multiple areas of intraparenchymal bleed-biggest 1 in the left high frontal, as well as a small bleed in the high right frontal and right periventricular areas suspicious for metastatic process.  Less likely traumatic.  MRI from 2021 not suggestive of MR angiopathy. I recommended admission to the medicine service at [facility] as she has not required any drips for blood pressure management.  She has stayed under 140 systolic.  Her blood pressure goal should be less than 160 which can be managed with as needed antihypertensives. Differentials include multiple hemorrhagic metastases versus cerebral amyloid angiopathy. Mainstay of treatment is blood pressure management. Will require CT of the chest abdomen pelvis for a primary source. Beds have been difficult to come by per transfer center report-if she remains at [facility], she can be seen by the [place] [facility] neuro hospitalist-I will make the oncoming neuro hospitalist aware.  She definitely needs transfer to [facility] because of the multiple bleeds and possibly requiring multiple specialties which might not be possible at [facility]. I discussed my plan in detail with Dr. [name] over the phone.  -- Milon Dikes, MD Neurologist [facility] Pager: [phone]

## 2021-03-13 NOTE — ED Triage Notes (Signed)
Pt comes into the ED via EMS from home was found on the floor today with right sided weakness, covered in urine and feces. Son found the pt on the floor  today states she has been in the floor for the past 2 days.  144/88 62HR 97%RA [id number]

## 2021-03-13 NOTE — ED Provider Notes (Signed)
[facility] Emergency Department Provider Note  ____________________________________________   Event Date/Time   First MD Initiated Contact with Patient 03/13/21 1704     (approximate)  I have reviewed the triage vital signs and the nursing notes.   HISTORY  Chief Complaint Stroke Symptoms    HPI Jenna Powers is a 80 y.o. female  with h/o CVA, HTN, HLD, here with fall, R sided weakness. Pt reports that 2 days ago, Jenna Powers developed acute onset of R sided arm and leg weakness, numbness. Jenna Powers was unable to get herself up due to this weakness and has been lying on the floor for this entire time. Has not had anything to eat/drink. Reports Jenna Powers has a headache, mild neck pain, and ongoing R sided weakness. Unable to move her R arm or leg at all. Denies any vision change. Reports a h/o stroke years ago with some residual R sided leg weakness, though nothing as severe as it is now. Denies any recent med changes. Denies known trauma or fall. No alleviating factor or improvement in the last 48 hours.        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 Authorizing Provider  albuterol (VENTOLIN HFA) 108 (90 Base) MCG/ACT inhaler Inhale 2 puffs into the lungs every 6 (six) hours  as needed for wheezing or shortness of breath. Use with spacer 10/28/19   Domenick Gong, MD  amLODipine (NORVASC) 10 MG tablet Take 10 mg by mouth daily. 08/28/19   [provider]  aspirin 81 MG EC tablet Take 81 mg by mouth daily.    [provider]  clopidogrel (PLAVIX) 75 MG tablet Take 75 mg by mouth daily. 05/28/19   [provider]  FLUoxetine (PROZAC) 40 MG capsule Take 40 mg by mouth daily.    [provider]  hydrochlorothiazide (HYDRODIURIL) 25 MG tablet Take 25 mg by mouth daily.    [provider]  HYDROcodone-acetaminophen (NORCO/VICODIN) 5-325 MG tablet Take 1 tablet by mouth every 6 (six) hours as needed for moderate pain.     [provider]  hydrOXYzine (ATARAX/VISTARIL) 25 MG tablet Take 25 mg by mouth 4 (four) times daily as needed. 08/04/19   [provider]  levothyroxine (SYNTHROID) 25 MCG tablet Take 25 mcg by mouth every morning. 07/31/19   [provider]  lisinopril (ZESTRIL) 40 MG tablet Take 40 mg by mouth daily. 07/23/19   [provider]  oxybutynin (DITROPAN) 5 MG tablet Take 5 mg by mouth 2 (two) times daily.     [provider]  propranolol ER (INDERAL LA) 80 MG 24 hr capsule Hold this medication because your heart rate is already low in 50's without this. 09/11/19  Darlin Priestly, MD  sertraline (ZOLOFT) 100 MG tablet Take 100 mg by mouth daily. 08/27/19   [provider]  Spacer/Aero-Holding Chambers (AEROCHAMBER PLUS) inhaler Use as instructed 10/28/19   Domenick Gong, MD  tiZANidine (ZANAFLEX) 2 MG tablet Take 2 mg by mouth at bedtime as needed. 07/12/19   [provider]  traZODone (DESYREL) 50 MG tablet Take 50 mg by mouth at bedtime.    [provider]    Allergies Patient has no known allergies.  Family History  Problem Relation Age of Onset   Pneumonia Mother    Hypertension Father    Heart disease Father    Stroke Father     Social  History Social History   Tobacco Use   Smoking status: Former    Packs/day: 0.15    Types: Cigarettes    Quit date: 07/2019    Years since quitting: 1.6   Smokeless tobacco: Never  Vaping Use   Vaping Use: Never used  Substance Use Topics   Alcohol use: Not Currently   Drug use: No    Review of Systems  Review of Systems  Constitutional:  Positive for fatigue. Negative for chills and fever.  HENT:  Negative for sore throat.   Respiratory:  Negative for shortness of breath.   Cardiovascular:  Negative for chest pain.  Gastrointestinal:  Negative for abdominal pain.  Genitourinary:  Negative for flank pain.  Musculoskeletal:  Positive for gait problem and neck pain.  Skin:  Negative for rash and wound.  Allergic/Immunologic: Negative for immunocompromised state.  Neurological:  Positive for weakness and numbness.  Hematological:  Does not bruise/bleed easily.  All other systems reviewed and are negative.   ____________________________________________  PHYSICAL EXAM:      VITAL SIGNS: ED Triage Vitals [03/13/21 1710]  Enc Vitals Group     BP (!) 164/93     Pulse Rate 69     Resp 17     Temp 97.8 F (36.6 C)     Temp Source Oral     SpO2 96 %     Weight      Height      Head Circumference      Peak Flow      Pain Score      Pain Loc      Pain Edu?      Excl. in GC?      Physical Exam Vitals and nursing note reviewed.  Constitutional:      General: Jenna Powers is not in acute distress.    Appearance: Jenna Powers is well-developed.  HENT:     Head: Normocephalic and atraumatic.     Mouth/Throat:     Mouth: Mucous membranes are dry.  Eyes:     Conjunctiva/sclera: Conjunctivae normal.  Cardiovascular:     Rate and Rhythm: Normal rate and regular rhythm.     Heart sounds: Normal heart sounds. No murmur heard.   No friction rub.  Pulmonary:     Effort: Pulmonary effort is normal. No respiratory distress.     Breath sounds: Normal breath sounds. No wheezing or rales.   Abdominal:     General: There is no distension.     Palpations: Abdomen is soft.     Tenderness: There is no abdominal tenderness.  Musculoskeletal:     Cervical back: Neck supple.  Skin:    General: Skin is cool.     Capillary Refill: Capillary refill takes less than 2 seconds.     Comments:  Pressure sores noted to R knee, buttocks/back area. No open wounds/deep involvement at this time. Covered in feces, urine.  Neurological:     Mental Status: Jenna Powers is alert and oriented to person, place, and time.     Motor: No abnormal muscle tone.     Comments: Dense R hemiparesis with RUE, RLE 0/5 strength. Diminished sensation to touch throughout RUE and RLE. CN otherwise intact. Gait unable to be assessed.      ____________________________________________   LABS (all labs ordered are listed, but only abnormal results are displayed)  Labs Reviewed  COMPREHENSIVE METABOLIC PANEL - Abnormal; Notable for the following components:      Result Value   Sodium 134 (*)    Chloride 97 (*)    Glucose, Bld 127 (*)    BUN 38 (*)    Creatinine, Ser 1.81 (*)    AST 73 (*)    Total Bilirubin 1.3 (*)    GFR, Estimated 28 (*)    All other components within normal limits  CBC WITH DIFFERENTIAL/PLATELET - Abnormal; Notable for the following components:   WBC 24.2 (*)    Platelets 428 (*)    Neutro Abs 20.1 (*)    Monocytes Absolute 2.1 (*)    Abs Immature Granulocytes 0.26 (*)    All other components within normal limits  CK - Abnormal; Notable for the following components:   Total CK 1,390 (*)    All other components within normal limits  TROPONIN I (HIGH SENSITIVITY) - Abnormal; Notable for the following components:   Troponin I (High Sensitivity) 92 (*)    All other components within normal limits  TROPONIN I (HIGH SENSITIVITY) - Abnormal; Notable for the following components:   Troponin I (High Sensitivity) 82 (*)    All other components within normal limits  RESP PANEL BY RT-PCR (FLU A&B,  COVID) ARPGX2  CULTURE, BLOOD (SINGLE)  PROTIME-INR  APTT  MAGNESIUM  URINALYSIS, COMPLETE (UACMP) WITH MICROSCOPIC  CBG MONITORING, ED    ____________________________________________  EKG: Normal sinus rhythm, VR 68. PR 159, QRS 141, QTc 509. No acute St elevations or depressions. RBBB. TWI in lead V3. No acute ST elevations. QT prolonged at 509. ________________________________________  RADIOLOGY All imaging, including plain films, CT scans, and ultrasounds, independently reviewed by me, and interpretations confirmed via formal radiology reads.  ED MD interpretation:   Tib/fib: negative R Hip: negative CXR: Cardiomegaly, negative CT Head: IPH with midline shift (4 mm), additional subtle cortical based lesion R frontal lobe, ? Metastatic disease CT CSpine: Negative  Official radiology report(s): DG Tibia/Fibula Right  Result Date: 03/13/2021 CLINICAL DATA:  Status post fall. EXAM: RIGHT TIBIA AND FIBULA - 2 VIEW COMPARISON:  None. FINDINGS: There is no evidence of an acute fracture. A small chronic appearing deformity is seen along the distal aspect of the right lateral malleolus. This represents a new finding when compared to the prior study. 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 Soft tissues are unremarkable. IMPRESSION: 1. No acute osseous abnormality. 2. Chronic and postoperative changes involving the right ankle, as described above. Electronically Signed   By: Aram Candela M.D.   On: 03/13/2021 20:06   CT HEAD WO CONTRAST  Result Date: 03/13/2021 CLINICAL DATA:  Found on floor, right-sided weakness EXAM: CT HEAD WITHOUT CONTRAST CT CERVICAL SPINE WITHOUT CONTRAST TECHNIQUE: Multidetector CT imaging of the head and cervical spine was performed following the standard  protocol without intravenous contrast. Multiplanar CT image reconstructions of the cervical spine were  also generated. COMPARISON:  CT brain, 09/10/2019 FINDINGS: CT HEAD FINDINGS Brain: No evidence of acute infarction, hydrocephalus, extra-axial collection or mass lesion/mass effect. There is an intraparenchymal hemorrhage within the medial high left frontal lobe measuring 2.7 cm with adjacent edema (series 2, image 28). There is minimal associated subarachnoid hemorrhage in this vicinity (series 2, image 28). Approximately 4 mm left right midline shift. There is an additional very subtle cortical based hyperdense lesion of the medial high right frontal lobe measuring 0.4 cm (series 2, image 29). There is a hyperdense subependymal lesion about the posterior right lateral ventricle measuring 0.9 cm (series 2, image 18). Vascular: No hyperdense vessel or unexpected calcification. Skull: Normal. Negative for fracture or focal lesion. Sinuses/Orbits: No acute finding. Other: None. CT CERVICAL SPINE FINDINGS Alignment: Degenerative straightening and reversal of the normal cervical lordosis. Skull base and vertebrae: No acute fracture. No primary bone lesion or focal pathologic process. Soft tissues and spinal canal: No prevertebral fluid or swelling. No visible canal hematoma. Disc levels: Moderate to severe disc space height loss and osteophytosis, worst from C4 through C6. Upper chest: Negative. Other: None. IMPRESSION: 1. There is an intraparenchymal hemorrhage within the medial high left frontal lobe measuring 2.7 cm with adjacent edema. There is minimal associated subarachnoid hemorrhage in this vicinity. 2. There is approximately 4 mm left right midline shift. 3. There is an additional very subtle cortical based hyperdense lesion of the medial high right frontal lobe measuring 0.4 cm, as well as a hyperdense subependymal lesion about the posterior right lateral ventricle measuring 0.9 cm. 4. Constellation of findings is generally suspicious for hemorrhage associated with metastatic lesions. Small emboli from a  central source could also produce this appearance. Consider contrast enhanced MRI to further evaluate. 5. No fracture or static subluxation of the cervical spine. 6. Cervical disc degenerative disease. Findings discussed by telephone with Dr. [name] at 6:50 p.m., 03/13/2021. Electronically Signed   By: Lauralyn Primes M.D.   On: 03/13/2021 18:53   CT Cervical Spine Wo Contrast  Result Date: 03/13/2021 CLINICAL DATA:  Found on floor, right-sided weakness EXAM: CT HEAD WITHOUT CONTRAST CT CERVICAL SPINE WITHOUT CONTRAST TECHNIQUE: Multidetector CT imaging of the head and cervical spine was performed following the standard protocol without intravenous contrast. Multiplanar CT image reconstructions of the cervical spine were also generated. COMPARISON:  CT brain, 09/10/2019 FINDINGS: CT HEAD FINDINGS Brain: No evidence of acute infarction, hydrocephalus, extra-axial collection or mass lesion/mass effect. There is an intraparenchymal hemorrhage within the medial high left frontal lobe measuring 2.7 cm with adjacent edema (series 2, image 28). There is minimal associated subarachnoid hemorrhage in this vicinity (series 2, image 28). Approximately 4 mm left right midline shift. There is an additional very subtle cortical based hyperdense lesion of the medial high right frontal lobe measuring 0.4 cm (series 2, image 29). There is a hyperdense subependymal lesion about the posterior right lateral ventricle measuring 0.9 cm (series 2, image 18). Vascular: No hyperdense vessel or unexpected calcification. Skull: Normal. Negative for fracture or focal lesion. Sinuses/Orbits: No acute finding. Other: None. CT CERVICAL SPINE FINDINGS Alignment: Degenerative straightening and reversal of the normal cervical lordosis. Skull base and vertebrae: No acute fracture. No primary bone lesion or focal pathologic process. Soft tissues and spinal canal: No prevertebral fluid or swelling. No visible canal hematoma. Disc levels: Moderate to  severe disc space height loss and osteophytosis, worst from  C4 through C6. Upper chest: Negative. Other: None. IMPRESSION: 1. There is an intraparenchymal hemorrhage within the medial high left frontal lobe measuring 2.7 cm with adjacent edema. There is minimal associated subarachnoid hemorrhage in this vicinity. 2. There is approximately 4 mm left right midline shift. 3. There is an additional very subtle cortical based hyperdense lesion of the medial high right frontal lobe measuring 0.4 cm, as well as a hyperdense subependymal lesion about the posterior right lateral ventricle measuring 0.9 cm. 4. Constellation of findings is generally suspicious for hemorrhage associated with metastatic lesions. Small emboli from a central source could also produce this appearance. Consider contrast enhanced MRI to further evaluate. 5. No fracture or static subluxation of the cervical spine. 6. Cervical disc degenerative disease. Findings discussed by telephone with Dr. [name] at 6:50 p.m., 03/13/2021. Electronically Signed   By: Lauralyn Primes M.D.   On: 03/13/2021 18:53   DG Chest Portable 1 View  Result Date: 03/13/2021 CLINICAL DATA:  Status post fall. EXAM: PORTABLE CHEST 1 VIEW COMPARISON:  None. FINDINGS: There is no evidence of an acute infiltrate, pleural effusion or pneumothorax. The cardiac silhouette is mildly enlarged. Marked severity calcification of the aortic arch is seen. Mild S-shaped scoliosis of the thoracolumbar spine is noted with multilevel degenerative changes. IMPRESSION: Cardiomegaly without acute or active cardiopulmonary disease. Electronically Signed   By: Aram Candela M.D.   On: 03/13/2021 20:03   DG Hip Unilat W or Wo Pelvis 2-3 Views Right  Result Date: 03/13/2021 CLINICAL DATA:  Status post fall. EXAM: DG HIP (WITH OR WITHOUT PELVIS) 2-3V RIGHT COMPARISON:  None. FINDINGS: There is no evidence of hip fracture or dislocation. There is no evidence of arthropathy or other focal bone  abnormality. IMPRESSION: Negative. Electronically Signed   By: Aram Candela M.D.   On: 03/13/2021 20:06    ____________________________________________  PROCEDURES   Procedure(s) performed (including Critical Care):  .Critical Care  Date/Time: 03/14/2021 12:08 AM Performed by: Shaune Pollack, MD Authorized by: Shaune Pollack, MD   Critical care provider statement:    Critical care time (minutes):  35   Critical care time was exclusive of:  Separately billable procedures and treating other patients and teaching time   Critical care was necessary to treat or prevent imminent or life-threatening deterioration of the following conditions:  Cardiac failure, circulatory failure, respiratory failure and CNS failure or compromise   Critical care was time spent personally by me on the following activities:  Development of treatment plan with patient or surrogate, discussions with consultants, evaluation of patient's response to treatment, examination of patient, obtaining history from patient or surrogate, ordering and performing treatments and interventions, ordering and review of laboratory studies, ordering and review of radiographic studies, pulse oximetry, re-evaluation of patient's condition and review of old charts   I assumed direction of critical care for this patient from another provider in my specialty: no    ____________________________________________  INITIAL IMPRESSION / MDM / ASSESSMENT AND PLAN / ED COURSE  As part of my medical decision making, I reviewed the following data within the electronic MEDICAL RECORD NUMBER Nursing notes reviewed and incorporated, Old chart reviewed, Notes from prior ED visits, and [place] Controlled Substance Database       *Christan Ciccarelli was evaluated in Emergency Department on 03/14/2021 for the symptoms described in the history of present illness. Jenna Powers was evaluated in the context of the global COVID-19 pandemic, which necessitated consideration  that the patient might be at risk for  infection with the SARS-CoV-2 virus that causes COVID-19. Institutional protocols and algorithms that pertain to the evaluation of patients at risk for COVID-19 are in a state of rapid change based on information released by regulatory bodies including the CDC and federal and state organizations. These policies and algorithms were followed during the patient's care in the ED.  Some ED evaluations and interventions may be delayed as a result of limited staffing during the pandemic.*     Medical Decision Making:  80 yo M with PMHx above here with acute R sided weakness, found down after 2 days. Labs, imaging as above. Pt has acute R frontal IPH, with concern for underlying metastatic disease based on additional lesions. Less likely hemorrhagic stroke, amyloidosis, traumatic hemorrhage. Discussed case with Dr. Glendell Dockerooke of NSGY here who [facility], but per ICU will need to transfer. D/[facility] Jordan LikesPool [facility] who recommends medicine/Neuro admission. Dr. Wilford CornerArora of Neuro consulted, and will admit to Dr. Loney Lohathore with Hospitalist service. Otherwise, labs show likely rhabdo from prolonged downtime - ivf running. Trop mildly elevated but ekg nonischemic, likely demand. [facility]  If patient is boarding in ED: - Continue fluids - Recheck MRI at 5-6 AM - Goal SBP<160  ____________________________________________  FINAL CLINICAL IMPRESSION(S) / ED DIAGNOSES  Final diagnoses:  Cerebrovascular accident (CVA) due to thrombosis of precerebral artery (HCC)     MEDICATIONS GIVEN DURING THIS VISIT:  Medications  gadobutrol (GADAVIST) 1 MMOL/ML injection 6 mL (has no administration in time range)  labetalol (NORMODYNE) injection 10 mg (has no administration in time range)  acetaminophen (TYLENOL) tablet 650 mg (has no administration in time range)  sodium chloride flush (NS) 0.9 % injection 3 mL (3 mLs Intravenous Given 03/13/21 1830)   0.9 %  sodium chloride infusion ( Intravenous New Bag/Given 03/13/21 2040)  levETIRAcetam (KEPPRA) IVPB 1500 mg/ 100 mL premix (0 mg Intravenous Stopped 03/13/21 2321)  labetalol (NORMODYNE) injection 5 mg (5 mg Intravenous Given 03/13/21 2040)  LORazepam (ATIVAN) injection 1 mg (1 mg Intravenous Given 03/13/21 2106)     ED Discharge Orders     None        Note:  This document was prepared using Dragon voice recognition software and may include unintentional dictation errors.   Shaune PollackIsaacs, Annistyn Depass, MD 03/14/21 Burna [id number]

## 2021-03-13 NOTE — ED Notes (Signed)
Assisted patient with calling adult son: [name] [phone]

## 2021-03-13 NOTE — ED Notes (Signed)
To MRI

## 2021-03-13 NOTE — ED Notes (Signed)
Called [facility]ison) for neurosurgery for Isaacs,MD

## 2021-03-13 NOTE — ED Notes (Signed)
Called [facility] Jenna Powers) for neurosurgery for [name], MD

## 2021-03-13 NOTE — ED Notes (Signed)
[facility] Batten) called back to [name], MD w/ Neurosurgery

## 2021-03-14 ENCOUNTER — Emergency Department: Payer: Medicare HMO

## 2021-03-14 ENCOUNTER — Inpatient Hospital Stay: Admit: 2021-03-14 | Payer: Medicare HMO

## 2021-03-14 ENCOUNTER — Inpatient Hospital Stay (HOSPITAL_COMMUNITY)
Admission: EM | Admit: 2021-03-14 | Discharge: 2021-03-21 | DRG: 065 | Disposition: A | Discharge status: Skilled Nursing Facility | Payer: Medicare HMO | Source: Other Acute Inpatient Hospital | Attending: Internal Medicine | Admitting: Internal Medicine

## 2021-03-14 ENCOUNTER — Inpatient Hospital Stay: Payer: Medicare HMO

## 2021-03-14 DIAGNOSIS — R7989 Other specified abnormal findings of blood chemistry: Secondary | ICD-10-CM | POA: Diagnosis present

## 2021-03-14 DIAGNOSIS — J439 Emphysema, unspecified: Secondary | ICD-10-CM | POA: Diagnosis present

## 2021-03-14 DIAGNOSIS — N179 Acute kidney failure, unspecified: Secondary | ICD-10-CM | POA: Diagnosis present

## 2021-03-14 DIAGNOSIS — I611 Nontraumatic intracerebral hemorrhage in hemisphere, cortical: Principal | ICD-10-CM | POA: Diagnosis present

## 2021-03-14 DIAGNOSIS — R4189 Other symptoms and signs involving cognitive functions and awareness: Secondary | ICD-10-CM | POA: Diagnosis not present

## 2021-03-14 DIAGNOSIS — I130 Hypertensive heart and chronic kidney disease with heart failure and stage 1 through stage 4 chronic kidney disease, or unspecified chronic kidney disease: Secondary | ICD-10-CM | POA: Diagnosis not present

## 2021-03-14 DIAGNOSIS — T796XXD Traumatic ischemia of muscle, subsequent encounter: Secondary | ICD-10-CM

## 2021-03-14 DIAGNOSIS — I6389 Other cerebral infarction: Secondary | ICD-10-CM | POA: Diagnosis not present

## 2021-03-14 DIAGNOSIS — I619 Nontraumatic intracerebral hemorrhage, unspecified: Secondary | ICD-10-CM

## 2021-03-14 DIAGNOSIS — Z87891 Personal history of nicotine dependence: Secondary | ICD-10-CM

## 2021-03-14 DIAGNOSIS — Z79890 Hormone replacement therapy (postmenopausal): Secondary | ICD-10-CM | POA: Diagnosis not present

## 2021-03-14 DIAGNOSIS — I69351 Hemiplegia and hemiparesis following cerebral infarction affecting right dominant side: Secondary | ICD-10-CM | POA: Diagnosis not present

## 2021-03-14 DIAGNOSIS — N1832 Chronic kidney disease, stage 3b: Secondary | ICD-10-CM | POA: Diagnosis present

## 2021-03-14 DIAGNOSIS — E038 Other specified hypothyroidism: Secondary | ICD-10-CM | POA: Diagnosis not present

## 2021-03-14 DIAGNOSIS — E785 Hyperlipidemia, unspecified: Secondary | ICD-10-CM | POA: Diagnosis present

## 2021-03-14 DIAGNOSIS — Z8659 Personal history of other mental and behavioral disorders: Secondary | ICD-10-CM

## 2021-03-14 DIAGNOSIS — F32A Depression, unspecified: Secondary | ICD-10-CM | POA: Diagnosis present

## 2021-03-14 DIAGNOSIS — Z8249 Family history of ischemic heart disease and other diseases of the circulatory system: Secondary | ICD-10-CM | POA: Diagnosis not present

## 2021-03-14 DIAGNOSIS — M6282 Rhabdomyolysis: Secondary | ICD-10-CM | POA: Diagnosis present

## 2021-03-14 DIAGNOSIS — N1831 Chronic kidney disease, stage 3a: Secondary | ICD-10-CM | POA: Diagnosis present

## 2021-03-14 DIAGNOSIS — M1990 Unspecified osteoarthritis, unspecified site: Secondary | ICD-10-CM | POA: Diagnosis present

## 2021-03-14 DIAGNOSIS — F419 Anxiety disorder, unspecified: Secondary | ICD-10-CM | POA: Diagnosis present

## 2021-03-14 DIAGNOSIS — R45851 Suicidal ideations: Secondary | ICD-10-CM | POA: Diagnosis not present

## 2021-03-14 DIAGNOSIS — F0280 Dementia in other diseases classified elsewhere without behavioral disturbance: Secondary | ICD-10-CM | POA: Diagnosis present

## 2021-03-14 DIAGNOSIS — I610 Nontraumatic intracerebral hemorrhage in hemisphere, subcortical: Secondary | ICD-10-CM | POA: Diagnosis present

## 2021-03-14 DIAGNOSIS — I609 Nontraumatic subarachnoid hemorrhage, unspecified: Secondary | ICD-10-CM | POA: Diagnosis present

## 2021-03-14 DIAGNOSIS — I4510 Unspecified right bundle-branch block: Secondary | ICD-10-CM | POA: Diagnosis present

## 2021-03-14 DIAGNOSIS — Z79899 Other long term (current) drug therapy: Secondary | ICD-10-CM

## 2021-03-14 DIAGNOSIS — R778 Other specified abnormalities of plasma proteins: Secondary | ICD-10-CM | POA: Diagnosis present

## 2021-03-14 DIAGNOSIS — D72829 Elevated white blood cell count, unspecified: Secondary | ICD-10-CM | POA: Diagnosis present

## 2021-03-14 DIAGNOSIS — I5032 Chronic diastolic (congestive) heart failure: Secondary | ICD-10-CM | POA: Diagnosis present

## 2021-03-14 DIAGNOSIS — N189 Chronic kidney disease, unspecified: Secondary | ICD-10-CM

## 2021-03-14 DIAGNOSIS — S06369A Traumatic hemorrhage of cerebrum, unspecified, with loss of consciousness of unspecified duration, initial encounter: Secondary | ICD-10-CM | POA: Diagnosis not present

## 2021-03-14 DIAGNOSIS — I608 Other nontraumatic subarachnoid hemorrhage: Secondary | ICD-10-CM | POA: Diagnosis present

## 2021-03-14 DIAGNOSIS — Z20822 Contact with and (suspected) exposure to covid-19: Secondary | ICD-10-CM | POA: Diagnosis present

## 2021-03-14 DIAGNOSIS — T796XXA Traumatic ischemia of muscle, initial encounter: Secondary | ICD-10-CM | POA: Diagnosis present

## 2021-03-14 DIAGNOSIS — I680 Cerebral amyloid angiopathy: Secondary | ICD-10-CM | POA: Diagnosis not present

## 2021-03-14 DIAGNOSIS — E860 Dehydration: Secondary | ICD-10-CM | POA: Diagnosis present

## 2021-03-14 DIAGNOSIS — W19XXXA Unspecified fall, initial encounter: Secondary | ICD-10-CM | POA: Diagnosis present

## 2021-03-14 DIAGNOSIS — L89311 Pressure ulcer of right buttock, stage 1: Secondary | ICD-10-CM | POA: Diagnosis not present

## 2021-03-14 DIAGNOSIS — Z90710 Acquired absence of both cervix and uterus: Secondary | ICD-10-CM

## 2021-03-14 DIAGNOSIS — Z7982 Long term (current) use of aspirin: Secondary | ICD-10-CM

## 2021-03-14 DIAGNOSIS — R29810 Facial weakness: Secondary | ICD-10-CM | POA: Diagnosis not present

## 2021-03-14 DIAGNOSIS — S81811A Laceration without foreign body, right lower leg, initial encounter: Secondary | ICD-10-CM | POA: Diagnosis present

## 2021-03-14 DIAGNOSIS — Z79891 Long term (current) use of opiate analgesic: Secondary | ICD-10-CM

## 2021-03-14 DIAGNOSIS — Y92009 Unspecified place in unspecified non-institutional (private) residence as the place of occurrence of the external cause: Secondary | ICD-10-CM | POA: Diagnosis not present

## 2021-03-14 DIAGNOSIS — R29714 NIHSS score 14: Secondary | ICD-10-CM | POA: Diagnosis present

## 2021-03-14 DIAGNOSIS — G309 Alzheimer's disease, unspecified: Secondary | ICD-10-CM | POA: Diagnosis present

## 2021-03-14 DIAGNOSIS — I10 Essential (primary) hypertension: Secondary | ICD-10-CM | POA: Diagnosis present

## 2021-03-14 DIAGNOSIS — I1310 Hypertensive heart and chronic kidney disease without heart failure, with stage 1 through stage 4 chronic kidney disease, or unspecified chronic kidney disease: Secondary | ICD-10-CM | POA: Diagnosis present

## 2021-03-14 DIAGNOSIS — G629 Polyneuropathy, unspecified: Secondary | ICD-10-CM | POA: Diagnosis present

## 2021-03-14 DIAGNOSIS — Z823 Family history of stroke: Secondary | ICD-10-CM

## 2021-03-14 LAB — LIPID PANEL
Cholesterol: 225 mg/dL — ABNORMAL HIGH (ref 0–200)
HDL: 45 mg/dL (ref >40)
LDL Cholesterol: 136 mg/dL — ABNORMAL HIGH (ref 0–99)
Total CHOL/HDL Ratio: 5 RATIO
Triglycerides: 221 mg/dL — ABNORMAL HIGH (ref <150)
VLDL: 44 mg/dL — ABNORMAL HIGH (ref 0–40)

## 2021-03-14 LAB — CBC
HCT: 32.6 % — ABNORMAL LOW (ref 36.0–46.0)
Hemoglobin: 11.2 g/dL — ABNORMAL LOW (ref 12.0–15.0)
MCH: 32.8 pg (ref 26.0–34.0)
MCHC: 34.4 g/dL (ref 30.0–36.0)
MCV: 95.6 fL (ref 80.0–100.0)
Platelets: 316 10*3/uL (ref 150–400)
RBC: 3.41 MIL/uL — ABNORMAL LOW (ref 3.87–5.11)
RDW: 13 % (ref 11.5–15.5)
WBC: 13.8 10*3/uL — ABNORMAL HIGH (ref 4.0–10.5)
nRBC: 0 % (ref 0.0–0.2)

## 2021-03-14 LAB — BASIC METABOLIC PANEL
Anion gap: 9 (ref 5–15)
BUN: 43 mg/dL — ABNORMAL HIGH (ref 8–23)
CO2: 27 mmol/L (ref 22–32)
Calcium: 8.7 mg/dL — ABNORMAL LOW (ref 8.9–10.3)
Chloride: 103 mmol/L (ref 98–111)
Creatinine, Ser: 1.65 mg/dL — ABNORMAL HIGH (ref 0.44–1.00)
GFR, Estimated: 31 mL/min — ABNORMAL LOW (ref >60)
Glucose, Bld: 112 mg/dL — ABNORMAL HIGH (ref 70–99)
Potassium: 3.6 mmol/L (ref 3.5–5.1)
Sodium: 139 mmol/L (ref 135–145)

## 2021-03-14 LAB — URINALYSIS, COMPLETE (UACMP) WITH MICROSCOPIC
Glucose, UA: NEGATIVE mg/dL
Hgb urine dipstick: NEGATIVE
Ketones, ur: 5 mg/dL — AB
Leukocytes,Ua: NEGATIVE
Nitrite: NEGATIVE
Protein, ur: 30 mg/dL — AB
Specific Gravity, Urine: 1.026 (ref 1.005–1.030)
pH: 5 (ref 5.0–8.0)

## 2021-03-14 LAB — CBC WITH DIFFERENTIAL/PLATELET
Abs Immature Granulocytes: 0.14 10*3/uL — ABNORMAL HIGH (ref 0.00–0.07)
Basophils Absolute: 0 10*3/uL (ref 0.0–0.1)
Basophils Relative: 0 %
Eosinophils Absolute: 0 10*3/uL (ref 0.0–0.5)
Eosinophils Relative: 0 %
HCT: 31.5 % — ABNORMAL LOW (ref 36.0–46.0)
Hemoglobin: 10.6 g/dL — ABNORMAL LOW (ref 12.0–15.0)
Immature Granulocytes: 1 %
Lymphocytes Relative: 14 %
Lymphs Abs: 2.1 10*3/uL (ref 0.7–4.0)
MCH: 32 pg (ref 26.0–34.0)
MCHC: 33.7 g/dL (ref 30.0–36.0)
MCV: 95.2 fL (ref 80.0–100.0)
Monocytes Absolute: 1.6 10*3/uL — ABNORMAL HIGH (ref 0.1–1.0)
Monocytes Relative: 11 %
Neutro Abs: 11.4 10*3/uL — ABNORMAL HIGH (ref 1.7–7.7)
Neutrophils Relative %: 74 %
Platelets: 296 10*3/uL (ref 150–400)
RBC: 3.31 MIL/uL — ABNORMAL LOW (ref 3.87–5.11)
RDW: 12.9 % (ref 11.5–15.5)
WBC: 15.3 10*3/uL — ABNORMAL HIGH (ref 4.0–10.5)
nRBC: 0 % (ref 0.0–0.2)

## 2021-03-14 LAB — URINE DRUG SCREEN, QUALITATIVE (ARMC ONLY)
Amphetamines, Ur Screen: NOT DETECTED
Barbiturates, Ur Screen: NOT DETECTED
Benzodiazepine, Ur Scrn: NOT DETECTED
Cannabinoid 50 Ng, Ur ~~LOC~~: NOT DETECTED
Cocaine Metabolite,Ur ~~LOC~~: NOT DETECTED
MDMA (Ecstasy)Ur Screen: NOT DETECTED
Methadone Scn, Ur: NOT DETECTED
Opiate, Ur Screen: NOT DETECTED
Phencyclidine (PCP) Ur S: NOT DETECTED
Tricyclic, Ur Screen: NOT DETECTED

## 2021-03-14 LAB — PROTIME-INR
INR: 1.1 (ref 0.8–1.2)
Prothrombin Time: 14.5 seconds (ref 11.4–15.2)

## 2021-03-14 LAB — CBG MONITORING, ED: Glucose-Capillary: 102 mg/dL — ABNORMAL HIGH (ref 70–99)

## 2021-03-14 LAB — CK: Total CK: 817 U/L — ABNORMAL HIGH (ref 38–234)

## 2021-03-14 LAB — APTT: aPTT: 26 seconds (ref 24–36)

## 2021-03-14 MED ORDER — DEXTROSE-NACL 5-0.9 % IV SOLN: INTRAVENOUS | Status: DC

## 2021-03-14 MED ORDER — SODIUM CHLORIDE 0.9 % IV SOLN: INTRAVENOUS | Status: DC

## 2021-03-14 MED ORDER — ACETAMINOPHEN 325 MG PO TABS: 650.0000 mg | ORAL_TABLET | Freq: Four times a day (QID) | ORAL | Status: DC | PRN

## 2021-03-14 MED ORDER — NIMODIPINE 6 MG/ML PO SOLN
60.0000 mg | ORAL | Status: DC
  Filled 2021-03-14 (×4): qty 10

## 2021-03-14 MED ORDER — ACETAMINOPHEN 650 MG RE SUPP: 650.0000 mg | Freq: Four times a day (QID) | RECTAL | Status: DC | PRN

## 2021-03-14 MED ORDER — SODIUM CHLORIDE 0.9 % IV SOLN: INTRAVENOUS | Status: AC

## 2021-03-14 MED ORDER — NIMODIPINE 30 MG PO CAPS
60.0000 mg | ORAL_CAPSULE | ORAL | Status: DC
  Filled 2021-03-14 (×4): qty 2

## 2021-03-14 MED ORDER — LISINOPRIL 20 MG PO TABS
20.0000 mg | ORAL_TABLET | Freq: Every day | ORAL | Status: DC
  Administered 2021-03-14 – 2021-03-15 (×2): 20 mg via ORAL
  Filled 2021-03-14 (×2): qty 1

## 2021-03-14 MED ORDER — ONDANSETRON HCL 4 MG/2ML IJ SOLN: 4.0000 mg | Freq: Four times a day (QID) | INTRAMUSCULAR | Status: DC | PRN

## 2021-03-14 MED ORDER — ONDANSETRON HCL 4 MG PO TABS: 4.0000 mg | ORAL_TABLET | Freq: Four times a day (QID) | ORAL | Status: DC | PRN

## 2021-03-14 MED ORDER — PANTOPRAZOLE SODIUM 40 MG PO PACK
40.0000 mg | PACK | Freq: Every day | ORAL | Status: DC
  Filled 2021-03-14: qty 20

## 2021-03-14 MED ORDER — STROKE: EARLY STAGES OF RECOVERY BOOK
Freq: Once | Status: AC
  Filled 2021-03-14: qty 1

## 2021-03-14 MED ORDER — TRAMADOL HCL 50 MG PO TABS
50.0000 mg | ORAL_TABLET | Freq: Once | ORAL | Status: AC
  Administered 2021-03-15: 50 mg via ORAL
  Filled 2021-03-14: qty 1

## 2021-03-14 MED ORDER — LISINOPRIL 20 MG PO TABS: 20.0000 mg | ORAL_TABLET | Freq: Two times a day (BID) | ORAL | Status: DC

## 2021-03-14 MED ORDER — ACETAMINOPHEN 160 MG/5ML PO SOLN: 650.0000 mg | ORAL | Status: DC | PRN

## 2021-03-14 MED ORDER — ACETAMINOPHEN 325 MG PO TABS
650.0000 mg | ORAL_TABLET | ORAL | Status: DC | PRN
  Administered 2021-03-14 – 2021-03-16 (×5): 650 mg via ORAL
  Filled 2021-03-14 (×6): qty 2

## 2021-03-14 MED ORDER — HYDRALAZINE HCL 20 MG/ML IJ SOLN
10.0000 mg | INTRAMUSCULAR | Status: DC | PRN
  Administered 2021-03-14 – 2021-03-16 (×4): 10 mg via INTRAVENOUS
  Filled 2021-03-14 (×4): qty 1

## 2021-03-14 MED ORDER — ACETAMINOPHEN 650 MG RE SUPP: 650.0000 mg | RECTAL | Status: DC | PRN

## 2021-03-14 MED ORDER — LEVETIRACETAM IN NACL 1000 MG/100ML IV SOLN
1000.0000 mg | Freq: Two times a day (BID) | INTRAVENOUS | Status: DC
  Administered 2021-03-14: 1000 mg via INTRAVENOUS
  Filled 2021-03-14: qty 100

## 2021-03-14 MED ORDER — QUETIAPINE FUMARATE 25 MG PO TABS
25.0000 mg | ORAL_TABLET | Freq: Every day | ORAL | Status: DC | PRN
  Filled 2021-03-14: qty 1

## 2021-03-14 MED ORDER — QUETIAPINE FUMARATE 25 MG PO TABS: 25.0000 mg | ORAL_TABLET | Freq: Every day | ORAL | Status: DC | PRN

## 2021-03-14 MED ORDER — SERTRALINE HCL 100 MG PO TABS
100.0000 mg | ORAL_TABLET | Freq: Every day | ORAL | Status: DC
  Administered 2021-03-14 – 2021-03-21 (×8): 100 mg via ORAL
  Filled 2021-03-14 (×8): qty 1

## 2021-03-14 MED ORDER — PANTOPRAZOLE SODIUM 40 MG PO TBEC
40.0000 mg | DELAYED_RELEASE_TABLET | Freq: Every day | ORAL | Status: DC
  Administered 2021-03-14: 40 mg via ORAL
  Filled 2021-03-14: qty 1

## 2021-03-14 MED ORDER — IOHEXOL 300 MG/ML  SOLN
75.0000 mL | Freq: Once | INTRAMUSCULAR | Status: AC | PRN
  Administered 2021-03-14: 75 mL via INTRAVENOUS

## 2021-03-14 MED ORDER — LEVOTHYROXINE SODIUM 25 MCG PO TABS
25.0000 ug | ORAL_TABLET | Freq: Every morning | ORAL | Status: DC
  Administered 2021-03-15 – 2021-03-21 (×7): 25 ug via ORAL
  Filled 2021-03-14 (×7): qty 1

## 2021-03-14 MED ORDER — LABETALOL HCL 5 MG/ML IV SOLN
10.0000 mg | INTRAVENOUS | Status: DC | PRN
  Administered 2021-03-14: 10 mg via INTRAVENOUS
  Filled 2021-03-14: qty 4

## 2021-03-14 MED ORDER — STROKE: EARLY STAGES OF RECOVERY BOOK: Freq: Once | Status: DC

## 2021-03-14 MED ORDER — DOCUSATE SODIUM 100 MG PO CAPS
100.0000 mg | ORAL_CAPSULE | Freq: Two times a day (BID) | ORAL | Status: DC
  Administered 2021-03-14: 100 mg via ORAL
  Filled 2021-03-14: qty 1

## 2021-03-14 NOTE — Consult Note (Signed)
Neurosurgery-New Consultation Evaluation 03/14/2021 Jenna Powers [id number]  Identifying Statement: Jenna Powers is a 81 y.o. female from [facility] [place] [address] with intraparenchymal hemorrhage  Physician Requesting Consultation: [facility] Emergency Department  History of Present Illness: Jenna Powers is here after being found down at home. It appears she may have been down for a few days. She does have a history of stroke with some right side weakness but was noted to not be moving right side and was sent for CT head where a small left frontal IPH was noted. She was awakened for my exam and does communicate but appears tired. She states she can feel the right side but cant move it. She does not appear to have any focal speech difficulty. She does not have a history of cancer. She is reportedly not any any blood thinning medications. She was started on Keppra.   Past Medical History:  Past Medical History:  Diagnosis Date   Depression    Hyperlipemia    Hypertension    Neuropathy    Stroke (HCC)    Thyroid disease    Tremor     Social History: Social History   Socioeconomic History   Marital status: Married    Spouse name: Not on file   Number of children: Not on file   Years of education: Not on file   Highest education level: Not on file  Occupational History   Not on file  Tobacco Use   Smoking status: Former    Packs/day: 0.15    Types: Cigarettes    Quit date: 07/2019    Years since quitting: 1.6   Smokeless tobacco: Never  Vaping Use   Vaping Use: Never used  Substance and Sexual Activity   Alcohol use: Not Currently   Drug use: No   Sexual activity: Not on file  Other Topics Concern   Not on file  Social History Narrative   Not on file   Social Determinants of Health   Financial Resource Strain: Not on file  Food Insecurity: Not on file  Transportation Needs: Not on file  Physical Activity: Not on file  Stress: Not on file  Social  Connections: Not on file  Intimate Partner Violence: Not on file   Living arrangements (living alone, with partner): No family present  Family History: Family History  Problem Relation Age of Onset   Pneumonia Mother    Hypertension Father    Heart disease Father    Stroke Father     Review of Systems:  Review of Systems - General ROS: Negative Psychological ROS: Negative Ophthalmic ROS: Negative ENT ROS: Negative Hematological and Lymphatic ROS: Negative  Endocrine ROS: Negative Respiratory ROS: Negative Cardiovascular ROS: Negative Gastrointestinal ROS: Negative Genito-Urinary ROS: Negative Musculoskeletal ROS: Negative Neurological ROS: Positive for weakness Dermatological ROS: Negative  Physical Exam: BP (!) 145/65   Pulse 63   Temp 97.8 F (36.6 C) (Oral)   Resp 16   Ht 5\' 6"  (1.676 m)   Wt 56.7 kg   SpO2 97%   BMI 20.18 kg/m  Body mass index is 20.18 kg/m. Body surface area is 1.62 meters squared. General appearance: Alert, cooperative, in no acute distress Head: Normocephalic, atraumatic Eyes: Normal, EOM intact Oropharynx: Oropharynx appears dry Ext: No edema in LE bilaterally  Neurologic exam:  Mental status: alertness: alert, orientation: knew name and hospital, id not know month affect: normal Speech: hypophonic but appears intact Cranial nerves:  V/VII:no evidence of facial droop or  weakness  VIII: hearing intact Remainder of cranial nerves unable to bet tested secondary to cooperation  Motor:strength intact on left side, slight movement of toes to pain but no other movement on right arm and leg Sensory: intact to light touch and pain in all extremities Gait: not tested  Laboratory: Results for orders placed or performed during the hospital encounter of 03/13/21  Resp Panel by RT-PCR (Flu A&B, Covid) Nasopharyngeal Swab   Specimen: Nasopharyngeal Swab; Nasopharyngeal(NP) swabs in vial transport medium  Result Value Ref Range   SARS  Coronavirus 2 by RT PCR NEGATIVE NEGATIVE   Influenza A by PCR NEGATIVE NEGATIVE   Influenza B by PCR NEGATIVE NEGATIVE  Protime-INR  Result Value Ref Range   Prothrombin Time 13.8 11.4 - 15.2 seconds   INR 1.1 0.8 - 1.2  APTT  Result Value Ref Range   aPTT 25 24 - 36 seconds  Comprehensive metabolic panel  Result Value Ref Range   Sodium 134 (L) 135 - 145 mmol/L   Potassium 3.6 3.5 - 5.1 mmol/L   Chloride 97 (L) 98 - 111 mmol/L   CO2 27 22 - 32 mmol/L   Glucose, Bld 127 (H) 70 - 99 mg/dL   BUN 38 (H) 8 - 23 mg/dL   Creatinine, Ser 0.451.81 (H) 0.44 - 1.00 mg/dL   Calcium 9.3 8.9 - 40.910.3 mg/dL   Total Protein 7.2 6.5 - 8.1 g/dL   Albumin 4.0 3.5 - 5.0 g/dL   AST 73 (H) 15 - 41 U/L   ALT 36 0 - 44 U/L   Alkaline Phosphatase 87 38 - 126 U/L   Total Bilirubin 1.3 (H) 0.3 - 1.2 mg/dL   GFR, Estimated 28 (L) >60 mL/min   Anion gap 10 5 - 15  CBC with Differential  Result Value Ref Range   WBC 24.2 (H) 4.0 - 10.5 K/uL   RBC 3.93 3.87 - 5.11 MIL/uL   Hemoglobin 12.5 12.0 - 15.0 g/dL   HCT 81.136.8 91.436.0 - 78.246.0 %   MCV 93.6 80.0 - 100.0 fL   MCH 31.8 26.0 - 34.0 pg   MCHC 34.0 30.0 - 36.0 g/dL   RDW 95.612.6 21.311.5 - 08.615.5 %   Platelets 428 (H) 150 - 400 K/uL   nRBC 0.0 0.0 - 0.2 %   Neutrophils Relative % 83 %   Neutro Abs 20.1 (H) 1.7 - 7.7 K/uL   Lymphocytes Relative 7 %   Lymphs Abs 1.7 0.7 - 4.0 K/uL   Monocytes Relative 9 %   Monocytes Absolute 2.1 (H) 0.1 - 1.0 K/uL   Eosinophils Relative 0 %   Eosinophils Absolute 0.0 0.0 - 0.5 K/uL   Basophils Relative 0 %   Basophils Absolute 0.1 0.0 - 0.1 K/uL   Immature Granulocytes 1 %   Abs Immature Granulocytes 0.26 (H) 0.00 - 0.07 K/uL  CK  Result Value Ref Range   Total CK 1,390 (H) 38 - 234 U/L  Urinalysis, Complete w Microscopic Urine, Catheterized  Result Value Ref Range   Color, Urine AMBER (A) YELLOW   APPearance HAZY (A) CLEAR   Specific Gravity, Urine 1.026 1.005 - 1.030   pH 5.0 5.0 - 8.0   Glucose, UA NEGATIVE NEGATIVE  mg/dL   Hgb urine dipstick NEGATIVE NEGATIVE   Bilirubin Urine SMALL (A) NEGATIVE   Ketones, ur 5 (A) NEGATIVE mg/dL   Protein, ur 30 (A) NEGATIVE mg/dL   Nitrite NEGATIVE NEGATIVE   Leukocytes,Ua NEGATIVE NEGATIVE   RBC /  HPF 0-5 0 - 5 RBC/hpf   WBC, UA 0-5 0 - 5 WBC/hpf   Bacteria, UA RARE (A) NONE SEEN   Squamous Epithelial / LPF 6-10 0 - 5   Mucus PRESENT    Hyaline Casts, UA PRESENT   Magnesium  Result Value Ref Range   Magnesium 1.9 1.7 - 2.4 mg/dL  CBC with Differential  Result Value Ref Range   WBC 15.3 (H) 4.0 - 10.5 K/uL   RBC 3.31 (L) 3.87 - 5.11 MIL/uL   Hemoglobin 10.6 (L) 12.0 - 15.0 g/dL   HCT 23.5 (L) 57.3 - 22.0 %   MCV 95.2 80.0 - 100.0 fL   MCH 32.0 26.0 - 34.0 pg   MCHC 33.7 30.0 - 36.0 g/dL   RDW 25.4 27.0 - 62.3 %   Platelets 296 150 - 400 K/uL   nRBC 0.0 0.0 - 0.2 %   Neutrophils Relative % 74 %   Neutro Abs 11.4 (H) 1.7 - 7.7 K/uL   Lymphocytes Relative 14 %   Lymphs Abs 2.1 0.7 - 4.0 K/uL   Monocytes Relative 11 %   Monocytes Absolute 1.6 (H) 0.1 - 1.0 K/uL   Eosinophils Relative 0 %   Eosinophils Absolute 0.0 0.0 - 0.5 K/uL   Basophils Relative 0 %   Basophils Absolute 0.0 0.0 - 0.1 K/uL   Immature Granulocytes 1 %   Abs Immature Granulocytes 0.14 (H) 0.00 - 0.07 K/uL  Basic metabolic panel  Result Value Ref Range   Sodium 139 135 - 145 mmol/L   Potassium 3.6 3.5 - 5.1 mmol/L   Chloride 103 98 - 111 mmol/L   CO2 27 22 - 32 mmol/L   Glucose, Bld 112 (H) 70 - 99 mg/dL   BUN 43 (H) 8 - 23 mg/dL   Creatinine, Ser 7.62 (H) 0.44 - 1.00 mg/dL   Calcium 8.7 (L) 8.9 - 10.3 mg/dL   GFR, Estimated 31 (L) >60 mL/min   Anion gap 9 5 - 15  Troponin I (High Sensitivity)  Result Value Ref Range   Troponin I (High Sensitivity) 92 (H) <18 ng/L  Troponin I (High Sensitivity)  Result Value Ref Range   Troponin I (High Sensitivity) 82 (H) <18 ng/L   I personally reviewed labs  Imaging: CT Head: 1. There is an intraparenchymal hemorrhage within the  medial high left frontal lobe measuring 2.7 cm with adjacent edema. There is minimal associated subarachnoid hemorrhage in this vicinity. 2. There is approximately 4 mm left right midline shift. 3. There is an additional very subtle cortical based hyperdense lesion of the medial high right frontal lobe measuring 0.4 cm, as well as a hyperdense subependymal lesion about the posterior right lateral ventricle measuring 0.9 cm. 4. Constellation of findings is generally suspicious for hemorrhage associated with metastatic lesions. Small emboli from a central source could also produce this appearance   Impression/Plan:  Jenna Powers is here for evaluation of weakness and finding of small IPH in area of left motor cortex. There are other small hyperdensities and this could be primary hemorrhage vs secondary to underlying lesion. At this time, her weakness is not fully explained by CT and she need an EEG to rule out ongoing seizures. This could be a Todd's paralysis also. She needs a MRI of the brain with contrast for further evaluation and management of IPH. She does need neurology for prevention of further bleeding and BP control. Can review MRI but no surgery indicated at this time  1.  Diagnosis: IPH   2.  Plan - No surgery at this time Recommend EEG and if negative, then MRI
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
## 2021-03-14 NOTE — H&P (Addendum)
History and Physical    Jenna Powers AOZ:[id number] DOB: 11-28-1940 DOA: 03/13/2021  PCP: Jenna Kern, MD   Patient coming from: Home  I have personally briefly reviewed patient's old medical records in [facility]  Chief Complaint: Right sided weakness  HPI: Jenna Powers is a 80 y.o. female with medical history significant for depression, history of CVA, hypertension, dyslipidemia who was brought into the emergency room by EMS after she was found on the floor with right-sided weakness covered in urine and feces.  Patient's son had found her on the floor and it appears she had been on the floor for 2 days. Patient's last known well was on July 23, her son had talked to her and she was at her baseline. Family had difficulty reaching and through her phone went to check on her on 07/25 and found her on the floor unable to move and covered in feces. It appears that patient was more awake and alert when she arrived in the ER and was able to tell the emergency room physician that she developed sudden onset right-sided weakness 2 days prior to presentation resulting in a fall.  She was unable to get up and had been on the floor the entire time prior to her son's arrival. At the time of my evaluation she is oriented to person but not to place or time and is not able to follow commands. I am unable to do review of systems on this patient. Labs show sodium 139, potassium 3.6, chloride 103, bicarb 27, glucose 112, BUN 43, creatinine 1.65, calcium 8.7, troponin 82, white count 15.3, hemoglobin 10.6, hematocrit 31.5, MCV 95.2, RDW 12.9, platelet count 296, total CK1 390, Urinalysis is sterile Respiratory viral panel is negative CT scan of the head without contrast shows an intraparenchymal hemorrhage within the medial high left frontal lobe measuring 2.7 cm with adjacent edema.  There is minimal associated subarachnoid hemorrhage in this vicinity.  There is approximately 4 mm left-to-right  midline shift.  There is an additional very subtle cortical-based hypodense lesion of the medial high right frontal lobe measuring 0.4 cm as well as a hyperdense subependymal lesion about the posterior right lateral ventricle measuring 0.9 cm.  Constellation of findings are generally suspicious for hemorrhage associated with metastatic lesions.  Small emboli from a central source could also produce this appearance.  Consider contrast MRI to further evaluate. CT scan of cervical spine shows no fracture or static subluxation of the cervical spine.  Cervical disc degenerative disease. Chest x-ray reviewed by me shows cardiomegaly without acute or active cardiopulmonary disease. X-ray of the right tibia-fibula shows no acute osseous abnormality.Chronic and postoperative changes involving the right ankle, as described above. Right hip x-ray shows no evidence of hip fracture or dislocation. CT scan of the chest/abdomen/pelvis shows moderate sized intramuscular hematoma in the right gluteus maximus muscle with associated surrounding subcutaneous hemorrhage.  No acute pelvic or hip fractures.  Stable large right renal angiomyolipoma.  Advanced atherosclerotic calcification involving the thoracic and abdominal aorta and branch vessels.  Chronic appearing bronchitic type lung changes and mild underlying emphysema.  No acute pulmonary findings. Twelve-lead EKG reviewed by me shows sinus rhythm with a right bundle branch block.  ED Course: Patient is an 80 year old Caucasian female who was brought into the ER by EMS for evaluation after she was found on the floor at home with right-sided weakness.  Patient's symptoms started 2 days prior to her admission. CT scan of the brain  shows an acute intraparenchymal hemorrhage within the medial high right frontal lobe with adjacent edema as well as a left to right midline shift. She also has rhabdomyolysis.. Patient will be admitted to the hospital for further  evaluation.   Review of Systems: As per HPI otherwise all other systems reviewed and negative.    Past Medical History:  Diagnosis Date   Depression    Hyperlipemia    Hypertension    Neuropathy    Stroke [facility])    Thyroid disease    Tremor     Past Surgical History:  Procedure Laterality Date   ABDOMINAL HYSTERECTOMY     ANKLE SURGERY Right      reports that she quit smoking about 19 months ago. Her smoking use included cigarettes. She smoked an average of .15 packs per day. She has never used smokeless tobacco. She reports previous alcohol use. She reports that she does not use drugs.  No Known Allergies  Family History  Problem Relation Age of Onset   Pneumonia Mother    Hypertension Father    Heart disease Father    Stroke Father       Prior to Admission medications   Medication Sig Start Date End Date Taking? Authorizing Provider  acetaminophen (TYLENOL) 325 MG tablet Take 650 mg by mouth every 6 (six) hours as needed.   Yes [provider]  levothyroxine (SYNTHROID) 25 MCG tablet Take 25 mcg by mouth every morning. 07/31/19  Yes [provider]  lisinopril (ZESTRIL) 40 MG tablet Take 40 mg by mouth daily. 07/23/19  Yes [provider]  memantine (NAMENDA) 10 MG tablet Take 10 mg by mouth 2 (two) times daily. 01/13/21  Yes [provider]  Multiple Vitamin (MULTIVITAMIN WITH MINERALS) TABS tablet Take 1 tablet by mouth daily.   Yes [provider]  propranolol ER (INDERAL LA) 80 MG 24 hr capsule Hold this medication because your heart rate is already low in 50's without this. 09/11/19  Yes Darlin Priestly, MD  traMADol (ULTRAM) 50 MG tablet Take 50 mg by mouth 3 (three) times daily as needed. 02/07/21  Yes [provider]  traZODone (DESYREL) 100 MG tablet Take 200 mg by mouth at bedtime as needed. 01/17/21  Yes [provider]  albuterol (VENTOLIN HFA) 108 (90 Base) MCG/ACT inhaler Inhale 2 puffs into the lungs  every 6 (six) hours as needed for wheezing or shortness of breath. Use with spacer 10/28/19   Domenick Gong, MD  amLODipine (NORVASC) 10 MG tablet Take 10 mg by mouth daily. 08/28/19   [provider]  aspirin 81 MG EC tablet Take 81 mg by mouth daily.    [provider]  clopidogrel (PLAVIX) 75 MG tablet Take 75 mg by mouth daily. 05/28/19   [provider]  FLUoxetine (PROZAC) 40 MG capsule Take 40 mg by mouth daily.    [provider]  hydrochlorothiazide (HYDRODIURIL) 25 MG tablet Take 25 mg by mouth daily.    [provider]  HYDROcodone-acetaminophen (NORCO/VICODIN) 5-325 MG tablet Take 1 tablet by mouth every 6 (six) hours as needed for moderate pain.     [provider]  hydrOXYzine (ATARAX/VISTARIL) 25 MG tablet Take 25 mg by mouth 4 (four) times daily as needed. 08/04/19   [provider]  oxybutynin (DITROPAN) 5 MG tablet Take 5 mg by mouth 2 (two) times daily.     [provider]  sertraline (ZOLOFT) 100 MG tablet Take 100 mg by mouth  daily. 08/27/19   [provider]  Spacer/Aero-Holding Chambers (AEROCHAMBER PLUS) inhaler Use as instructed 10/28/19   Domenick Gong, MD  tiZANidine (ZANAFLEX) 2 MG tablet Take 2 mg by mouth at bedtime as needed. 07/12/19   [provider]  traZODone (DESYREL) 50 MG tablet Take 50 mg by mouth at bedtime.    [provider]    Physical Exam: Vitals:   03/14/21 0530 03/14/21 0627 03/14/21 0630 03/14/21 0700  BP: (!) 119/52 (!) 141/60 (!) 143/63 (!) 145/65  Pulse: (!) 43 60 (!) 46 63  Resp: 16 16    Temp:      TempSrc:      SpO2: 96% 97% 92% 97%  Weight:      Height:         Vitals:   03/14/21 0530 03/14/21 0627 03/14/21 0630 03/14/21 0700  BP: (!) 119/52 (!) 141/60 (!) 143/63 (!) 145/65  Pulse: (!) 43 60 (!) 46 63  Resp: 16 16    Temp:      TempSrc:      SpO2: 96% 97% 92% 97%  Weight:      Height:          Constitutional: Alert and  oriented only person . Not in any apparent distress HEENT:      Head: Normocephalic and atraumatic.         Eyes: PERLA, EOMI, Conjunctivae are normal. Sclera is non-icteric.       Mouth/Throat: Mucous membranes are moist.       Neck: Supple with no signs of meningismus. Cardiovascular: Regular rate and rhythm. No murmurs, gallops, or rubs. 2+ symmetrical distal pulses are present . No JVD. No LE edema Respiratory: Respiratory effort normal .Lungs sounds clear bilaterally. No wheezes, crackles, or rhonchi.  Gastrointestinal: Soft, non tender, and non distended with positive bowel sounds.  Genitourinary: No CVA tenderness. Musculoskeletal: Nontender with normal range of motion in all extremities.  Redness involving right lateral knee the right buttock and right hip, redness to the left medial ankle, laceration over the right shin Neurologic:  Face is symmetric.  Dense right sided weakness. Able to move her left upper and lower extremities. Skin: Skin is warm, dry.  No rash or ulcers Psychiatric: Depressed mood and affect    Labs on Admission: I have personally reviewed following labs and imaging studies  CBC: Recent Labs  Lab 03/13/21 1806 03/14/21 0401  WBC 24.2* 15.3*  NEUTROABS 20.1* 11.4*  HGB 12.5 10.6*  HCT 36.8 31.5*  MCV 93.6 95.2  PLT 428* 296   Basic Metabolic Panel: Recent Labs  Lab 03/13/21 1806 03/14/21 0401  NA 134* 139  K 3.6 3.6  CL 97* 103  CO2 27 27  GLUCOSE 127* 112*  BUN 38* 43*  CREATININE 1.81* 1.65*  CALCIUM 9.3 8.7*  MG 1.9  --    GFR: Estimated Creatinine Clearance: 23.5 mL/min (A) (by C-G formula based on SCr of 1.65 mg/dL (H)). Liver Function Tests: Recent Labs  Lab 03/13/21 1806  AST 73*  ALT 36  ALKPHOS 87  BILITOT 1.3*  PROT 7.2  ALBUMIN 4.0   No results for input(s): LIPASE, AMYLASE in the last 168 hours. No results for input(s): AMMONIA in the last 168 hours. Coagulation Profile: Recent Labs  Lab 03/13/21 1806  INR 1.1    Cardiac Enzymes: Recent Labs  Lab 03/13/21 1806  CKTOTAL 1,390*   BNP (last 3 results) No results for input(s): PROBNP in the last 8760 hours. HbA1C:  No results for input(s): HGBA1C in the last 72 hours. CBG: No results for input(s): GLUCAP in the last 168 hours. Lipid Profile: No results for input(s): CHOL, HDL, LDLCALC, TRIG, CHOLHDL, LDLDIRECT in the last 72 hours. Thyroid Function Tests: No results for input(s): TSH, T4TOTAL, FREET4, T3FREE, THYROIDAB in the last 72 hours. Anemia Panel: No results for input(s): VITAMINB12, FOLATE, FERRITIN, TIBC, IRON, RETICCTPCT in the last 72 hours. Urine analysis:    Component Value Date/Time   COLORURINE AMBER (A) 03/14/2021 0410   APPEARANCEUR HAZY (A) 03/14/2021 0410   APPEARANCEUR Hazy 06/13/2013 1729   LABSPEC 1.026 03/14/2021 0410   LABSPEC 1.013 06/13/2013 1729   PHURINE 5.0 03/14/2021 0410   GLUCOSEU NEGATIVE 03/14/2021 0410   GLUCOSEU Negative 06/13/2013 1729   HGBUR NEGATIVE 03/14/2021 0410   BILIRUBINUR SMALL (A) 03/14/2021 0410   BILIRUBINUR Negative 06/13/2013 1729   KETONESUR 5 (A) 03/14/2021 0410   PROTEINUR 30 (A) 03/14/2021 0410   NITRITE NEGATIVE 03/14/2021 0410   LEUKOCYTESUR NEGATIVE 03/14/2021 0410   LEUKOCYTESUR Trace 06/13/2013 1729    Radiological Exams on Admission: DG Tibia/Fibula Right  Result Date: 03/13/2021 CLINICAL DATA:  Status post fall. EXAM: RIGHT TIBIA AND FIBULA - 2 VIEW COMPARISON:  None. FINDINGS: There is no evidence of an acute fracture. A small chronic appearing deformity is seen along the distal aspect of the right lateral malleolus. This represents a new finding when compared to the prior study. 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 Soft tissues are unremarkable. IMPRESSION: 1. No acute osseous abnormality. 2. Chronic and postoperative changes involving the right ankle, as  described above. Electronically Signed   By: Aram Candela M.D.   On: 03/13/2021 20:06   CT HEAD WO CONTRAST  Result Date: 03/13/2021 CLINICAL DATA:  Found on floor, right-sided weakness EXAM: CT HEAD WITHOUT CONTRAST CT CERVICAL SPINE WITHOUT CONTRAST TECHNIQUE: Multidetector CT imaging of the head and cervical spine was performed following the standard protocol without intravenous contrast. Multiplanar CT image reconstructions of the cervical spine were also generated. COMPARISON:  CT brain, 09/10/2019 FINDINGS: CT HEAD FINDINGS Brain: No evidence of acute infarction, hydrocephalus, extra-axial collection or mass lesion/mass effect. There is an intraparenchymal hemorrhage within the medial high left frontal lobe measuring 2.7 cm with adjacent edema (series 2, image 28). There is minimal associated subarachnoid hemorrhage in this vicinity (series 2, image 28). Approximately 4 mm left right midline shift. There is an additional very subtle cortical based hyperdense lesion of the medial high right frontal lobe measuring 0.4 cm (series 2, image 29). There is a hyperdense subependymal lesion about the posterior right lateral ventricle measuring 0.9 cm (series 2, image 18). Vascular: No hyperdense vessel or unexpected calcification. Skull: Normal. Negative for fracture or focal lesion. Sinuses/Orbits: No acute finding. Other: None. CT CERVICAL SPINE FINDINGS Alignment: Degenerative straightening and reversal of the normal cervical lordosis. Skull base and vertebrae: No acute fracture. No primary bone lesion or focal pathologic process. Soft tissues and spinal canal: No prevertebral fluid or swelling. No visible canal hematoma. Disc levels: Moderate to severe disc space height loss and osteophytosis, worst from C4 through C6. Upper chest: Negative. Other: None. IMPRESSION: 1. There is an intraparenchymal hemorrhage within the medial high left frontal lobe measuring 2.7 cm with adjacent edema. There is minimal  associated subarachnoid hemorrhage in this vicinity. 2. There is approximately 4 mm left right midline shift. 3. There is an  additional very subtle cortical based hyperdense lesion of the medial high right frontal lobe measuring 0.4 cm, as well as a hyperdense subependymal lesion about the posterior right lateral ventricle measuring 0.9 cm. 4. Constellation of findings is generally suspicious for hemorrhage associated with metastatic lesions. Small emboli from a central source could also produce this appearance. Consider contrast enhanced MRI to further evaluate. 5. No fracture or static subluxation of the cervical spine. 6. Cervical disc degenerative disease. Findings discussed by telephone with Dr. [name] at 6:50 p.m., 03/13/2021. Electronically Signed   By: Lauralyn Primes M.D.   On: 03/13/2021 18:53   CT Cervical Spine Wo Contrast  Result Date: 03/13/2021 CLINICAL DATA:  Found on floor, right-sided weakness EXAM: CT HEAD WITHOUT CONTRAST CT CERVICAL SPINE WITHOUT CONTRAST TECHNIQUE: Multidetector CT imaging of the head and cervical spine was performed following the standard protocol without intravenous contrast. Multiplanar CT image reconstructions of the cervical spine were also generated. COMPARISON:  CT brain, 09/10/2019 FINDINGS: CT HEAD FINDINGS Brain: No evidence of acute infarction, hydrocephalus, extra-axial collection or mass lesion/mass effect. There is an intraparenchymal hemorrhage within the medial high left frontal lobe measuring 2.7 cm with adjacent edema (series 2, image 28). There is minimal associated subarachnoid hemorrhage in this vicinity (series 2, image 28). Approximately 4 mm left right midline shift. There is an additional very subtle cortical based hyperdense lesion of the medial high right frontal lobe measuring 0.4 cm (series 2, image 29). There is a hyperdense subependymal lesion about the posterior right lateral ventricle measuring 0.9 cm (series 2, image 18). Vascular: No  hyperdense vessel or unexpected calcification. Skull: Normal. Negative for fracture or focal lesion. Sinuses/Orbits: No acute finding. Other: None. CT CERVICAL SPINE FINDINGS Alignment: Degenerative straightening and reversal of the normal cervical lordosis. Skull base and vertebrae: No acute fracture. No primary bone lesion or focal pathologic process. Soft tissues and spinal canal: No prevertebral fluid or swelling. No visible canal hematoma. Disc levels: Moderate to severe disc space height loss and osteophytosis, worst from C4 through C6. Upper chest: Negative. Other: None. IMPRESSION: 1. There is an intraparenchymal hemorrhage within the medial high left frontal lobe measuring 2.7 cm with adjacent edema. There is minimal associated subarachnoid hemorrhage in this vicinity. 2. There is approximately 4 mm left right midline shift. 3. There is an additional very subtle cortical based hyperdense lesion of the medial high right frontal lobe measuring 0.4 cm, as well as a hyperdense subependymal lesion about the posterior right lateral ventricle measuring 0.9 cm. 4. Constellation of findings is generally suspicious for hemorrhage associated with metastatic lesions. Small emboli from a central source could also produce this appearance. Consider contrast enhanced MRI to further evaluate. 5. No fracture or static subluxation of the cervical spine. 6. Cervical disc degenerative disease. Findings discussed by telephone with Dr. [name] at 6:50 p.m., 03/13/2021. Electronically Signed   By: Lauralyn Primes M.D.   On: 03/13/2021 18:53   CT CHEST ABDOMEN PELVIS W CONTRAST  Result Date: 03/14/2021 CLINICAL DATA:  Found down. On floor for 2 days. EXAM: CT CHEST, ABDOMEN, AND PELVIS WITH CONTRAST TECHNIQUE: Multidetector CT imaging of the chest, abdomen and pelvis was performed following the standard protocol during bolus administration of intravenous contrast. CONTRAST:  3mL OMNIPAQUE IOHEXOL 300 MG/ML  SOLN COMPARISON:  CT  abdomen/pelvis from 10/03/2011 FINDINGS: CT CHEST FINDINGS Cardiovascular: The heart is within normal limits in size for age. No pericardial effusion. Advanced atherosclerotic calcifications involving the thoracic aorta. No dissection. Minimal scattered  coronary artery calcifications. Mediastinum/Nodes: No mediastinal or hilar mass or adenopathy. The esophagus is grossly normal. Lungs/Pleura: Chronic appearing bronchitic type lung changes and mild underlying emphysema but no infiltrates, edema or effusions. No worrisome pulmonary lesions. No pneumothorax. Musculoskeletal: No breast masses, supraclavicular or axillary adenopathy. The thyroid gland is unremarkable. The bony thorax is intact. No bone lesions or fractures. Age related osteoporosis and degenerative changes involving the spine. CT ABDOMEN PELVIS FINDINGS Hepatobiliary: No hepatic lesions are identified. The gallbladder is surgically absent and there is associated intra and extrahepatic biliary dilatation. The common bile duct measures up to 12 mm in the porta hepatis and 8 mm in the head of the pancreas. Pancreas: No mass, inflammation or ductal dilatation. Spleen: Normal size. No focal lesions. Adrenals/Urinary Tract: The adrenal glands are normal. Small/atrophic left kidney progressive since the prior study and likely due to renal artery stenosis. There is a stable large fatty mass involving the upper pole region of the right kidney. This measures a maximum of 5.0 x 4.0 cm but is unchanged. Findings consistent with a benign angiomyolipoma. The bladder is unremarkable. Stomach/Bowel: The stomach, duodenum, small bowel and colon are grossly normal without oral contrast. No acute inflammatory process, mass lesions or obstructive findings. Vascular/Lymphatic: Advanced atherosclerotic calcification involving the aorta and branch vessels but no aneurysm. No mesenteric or retroperitoneal mass or adenopathy. Reproductive: The uterus is surgically absent. Both  ovaries are still present and are unremarkable and stable. Other: No pelvic mass or adenopathy. No free pelvic fluid collections. No inguinal mass or adenopathy. No abdominal wall hernia or subcutaneous lesions. Musculoskeletal: There is a moderate-sized intramuscular hematoma noted in the right gluteus maximus muscle. This measures a maximum 5.8 cm. There is also some associated subcutaneous hemorrhage and also some inflammation/edema or hemorrhage in the ischial rectal fossa on the right side. Other areas of subcutaneous inflammation/edema or hemorrhage are noted near the right iliac crest and in the subcutaneous fat overlying the left buttock area. No acute pelvic or hip fractures are identified. Lumbar scoliosis and degenerative changes but no lumbar spine fractures. Both hips are normally located. Mild degenerative changes. 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 IMPRESSION: 1. Moderate-sized intramuscular hematoma in the right gluteus maximus muscle with associated surrounding subcutaneous hemorrhage. 2. No acute pelvic or hip fractures. 3. Stable large right renal angiomyolipoma. 4. Status post cholecystectomy with associated stable intra and extrahepatic biliary dilatation. 5. Advanced atherosclerotic calcification involving the thoracic and abdominal aorta and branch vessels. 6. Chronic appearing bronchitic type lung changes and mild underlying emphysema. No acute pulmonary findings. Aortic Atherosclerosis (ICD10-I70.0) Electronically Signed   By: Rudie Meyer M.D.   On: 03/14/2021 06:25   DG Chest Portable 1 View  Result Date: 03/13/2021 CLINICAL DATA:  Status post fall. EXAM: PORTABLE CHEST 1 VIEW COMPARISON:  None. FINDINGS: There is no evidence of an acute infiltrate, pleural effusion or pneumothorax. The cardiac silhouette is mildly enlarged. Marked severity calcification of the aortic arch is seen. Mild S-shaped scoliosis of the thoracolumbar spine is noted with  multilevel degenerative changes. IMPRESSION: Cardiomegaly without acute or active cardiopulmonary disease. Electronically Signed   By: Aram Candela M.D.   On: 03/13/2021 20:03   DG Hip Unilat W or Wo Pelvis 2-3 Views Right  Result Date: 03/13/2021 CLINICAL DATA:  Status post fall. EXAM: DG HIP (WITH OR WITHOUT PELVIS) 2-3V RIGHT COMPARISON:  None. FINDINGS: There is no evidence of hip fracture or dislocation. There is no evidence of arthropathy  or other focal bone abnormality. IMPRESSION: Negative. Electronically Signed   By: Aram Candelahaddeus  Houston M.D.   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 CT scan of the brain without contrast shows a small bleed in the high right frontal and periventricular area suspicious for metastatic process. ??  Traumatic Hold aspirin and Plavix Appreciate neurology input Optimize blood pressure control     Rhabdomyolysis following a fall Traumatic Continue IV fluid hydration Repeat CK levels in a.m.     Leukocytosis Unclear etiology.  No obvious source of infection at this time ??  Leukemoid reaction    Elevated troponin Most likely secondary to acute neurologic event Continue to trend troponin levels Obtain 2D echocardiogram to rule out regional wall motion abnormality.    Acute kidney injury superimposed on chronic kidney disease Secondary to acute rhabdomyolysis and dehydration Continue IV fluid hydration Repeat renal parameters in a.m.     DVT prophylaxis: SCD  Code Status: full code  Family  Communication: Called to speak to patient's emergency contact, Glori BickersBrawn Chandler, wife answered the phone and stated that there was no other number to reach patient's son and that he was at work.  She provided some of the history that she knew of.  Informed her that we would call and contact the family for update.  She verbalizes understanding and agrees with the plan. Disposition Plan: Back to previous home environment Consults called: Neurology/neurosurgery Status: At the time of admission, it appears that the appropriate admission status for this patient is inpatient. This is judged to be reasonable and necessary in order to provide the required intensity of service to ensure the patient's safety given the presenting symptoms, physical exam findings, and initial radiographic and laboratory data in the context of their comorbid conditions. Patient requires inpatient status due to high intensity of service, high risk for further deterioration and high frequency of surveillance required.    Lucile Shuttersochukwu Maxwell Lemen MD [facility]     03/14/2021, 8:57 AM

## 2021-03-14 NOTE — Procedures (Signed)
MRI at 10 am-will attempt eeg now

## 2021-03-14 NOTE — ED Notes (Signed)
Called [facility]) ... No bed assignment at this time. Probably after shift change.

## 2021-03-14 NOTE — H&P (Signed)
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 [facility] and page to a number that you can be directly reached. If you still have difficulty reaching the provider, please page the [facility] (Director on Call) for the Hospitalists listed on amion for assistance.   03/14/2021, 7:55 PM

## 2021-03-14 NOTE — ED Notes (Signed)
EEG tech at bedside setting up EEG

## 2021-03-14 NOTE — ED Notes (Signed)
Pt transported to MRI via stretcher at this time.  °

## 2021-03-14 NOTE — ED Notes (Signed)
Pt returned to room via stretcher at this time.

## 2021-03-14 NOTE — Progress Notes (Signed)
OT Cancellation Note  Patient Details Name: Jenna Powers MRN: [id number] DOB: 06-Oct-1940   Cancelled Treatment:    Reason Eval/Treat Not Completed: Patient not medically ready. Consult received, chart reviewed. 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 BP elevated and HR low. Will hold OT evaluation at this time and re-attempt at later date/time to ensure pt is medically stable for exertional activity.  Wynona Canes, MPH, MS, OTR/L ascom [phone] 03/14/21, 12:44 PM

## 2021-03-14 NOTE — ED Notes (Signed)
Called [facility]) ... no bed assigned at this time

## 2021-03-14 NOTE — ED Notes (Signed)
Secure chat sent to MD d/t BP 162/56, HR 35-50.

## 2021-03-14 NOTE — Plan of Care (Addendum)
Called by the ICU team who were consulted for blood pressure management on this patient. Systolic blood pressure goal is less than 160. She is not on any as needed or antihypertensives as standing doses. 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 Hospitalist team to manage antihypertensives. Consider ICU consultation formally if the above measures still remain ineffective in keeping blood pressures below 160 systolic consistently. Stroke neurology will follow.  -- Milon Dikes, MD Neurologist [facility] Pager: [phone]
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
## 2021-03-14 NOTE — ED Notes (Signed)
Report given to [facility]. 

## 2021-03-14 NOTE — Progress Notes (Signed)
PT Cancellation Note  Patient Details Name: Jenna Powers MRN: [id number] DOB: 01/02/1941   Cancelled Treatment:    Reason Eval/Treat Not Completed: Patient not medically ready  Patient admitted 03/13/21 with multiple intraparenchymal hemorrhages with left-to-right shift 3 mm. BP elevated and HR low. Will hold PT evaluation at this time and re-attempt at later date/time to ensure pt is medically stable for exertional activity.  Genesis Paget PT, DPT 03/14/2021, 1:21 PM

## 2021-03-15 ENCOUNTER — Inpatient Hospital Stay (HOSPITAL_COMMUNITY): Payer: Medicare HMO

## 2021-03-15 DIAGNOSIS — D72829 Elevated white blood cell count, unspecified: Secondary | ICD-10-CM

## 2021-03-15 DIAGNOSIS — Z8659 Personal history of other mental and behavioral disorders: Secondary | ICD-10-CM

## 2021-03-15 DIAGNOSIS — I6389 Other cerebral infarction: Secondary | ICD-10-CM | POA: Diagnosis not present

## 2021-03-15 DIAGNOSIS — I619 Nontraumatic intracerebral hemorrhage, unspecified: Secondary | ICD-10-CM

## 2021-03-15 DIAGNOSIS — M6282 Rhabdomyolysis: Secondary | ICD-10-CM

## 2021-03-15 DIAGNOSIS — N189 Chronic kidney disease, unspecified: Secondary | ICD-10-CM

## 2021-03-15 DIAGNOSIS — R778 Other specified abnormalities of plasma proteins: Secondary | ICD-10-CM

## 2021-03-15 DIAGNOSIS — N179 Acute kidney failure, unspecified: Secondary | ICD-10-CM

## 2021-03-15 LAB — ECHOCARDIOGRAM COMPLETE
Area-P 1/2: 4.01 cm2
S' Lateral: 2.6 cm

## 2021-03-15 LAB — HEMOGLOBIN A1C
Hgb A1c MFr Bld: 5.7 % — ABNORMAL HIGH (ref 4.8–5.6)
Mean Plasma Glucose: 117 mg/dL

## 2021-03-15 MED ORDER — HALOPERIDOL LACTATE 5 MG/ML IJ SOLN
2.0000 mg | Freq: Four times a day (QID) | INTRAMUSCULAR | Status: DC | PRN
  Administered 2021-03-15 – 2021-03-20 (×5): 2 mg via INTRAVENOUS
  Filled 2021-03-15 (×4): qty 1

## 2021-03-15 MED ORDER — MELATONIN 3 MG PO TABS
3.0000 mg | ORAL_TABLET | Freq: Every day | ORAL | Status: DC
  Administered 2021-03-15 – 2021-03-20 (×6): 3 mg via ORAL
  Filled 2021-03-15 (×6): qty 1

## 2021-03-15 MED ORDER — HEPARIN SODIUM (PORCINE) 5000 UNIT/ML IJ SOLN
5000.0000 [IU] | Freq: Three times a day (TID) | INTRAMUSCULAR | Status: DC
  Administered 2021-03-15 – 2021-03-21 (×19): 5000 [IU] via SUBCUTANEOUS
  Filled 2021-03-15 (×19): qty 1

## 2021-03-15 MED ORDER — WHITE PETROLATUM EX OINT
TOPICAL_OINTMENT | CUTANEOUS | Status: AC
  Administered 2021-03-15: 1
  Filled 2021-03-15: qty 28.35

## 2021-03-15 MED ORDER — LISINOPRIL 20 MG PO TABS
20.0000 mg | ORAL_TABLET | Freq: Two times a day (BID) | ORAL | Status: DC
  Administered 2021-03-15 – 2021-03-21 (×12): 20 mg via ORAL
  Filled 2021-03-15 (×13): qty 1

## 2021-03-15 MED ORDER — ONDANSETRON HCL 4 MG/2ML IJ SOLN
4.0000 mg | Freq: Four times a day (QID) | INTRAMUSCULAR | Status: DC | PRN
  Administered 2021-03-18: 4 mg via INTRAVENOUS
  Filled 2021-03-15: qty 2

## 2021-03-15 MED ORDER — SODIUM CHLORIDE 0.9 % IV SOLN: INTRAVENOUS | Status: AC

## 2021-03-15 NOTE — Evaluation (Signed)
Occupational Therapy Evaluation Patient Details Name: Jenna Powers MRN: [id number] DOB: 1940-12-29 Today's Date: 03/15/2021    History of Present Illness 80 yo female presented to ED 03/13/21 after being found on the floor with R-sided weakness. Pt is a transfer from [facility] CT scan of the brain shows an acute intraparenchymal hemorrhage within the medial high right frontal lobe with adjacent edema as well as a left to right midline shift. MRI: 8 mm acute hemorrhage adjacent to R lateral ventricle, 2.8 cm hemorrhage L posterior frontal vertex, 3-4 MM focal hemorhages R frontoparietal and L posterior frontal vertex, L subdural effusion with left-to-right shift of 3 mm.  PMH OA depression HLD HTN neuropathy thyroid disease tremor   Clinical Impression   Pt PTA: pt reports living independently. Pt currently, very anxious upon arrival requiring redirection due to deficits in decreased cognition, decreased ability to care for self and decreased activity tolerance. Pt following 50% of commands. Pt set-upA to totalA for ADL tasks. Pt ModA+2 for bed mobility and maxA+2 for stand pivot transfers. Pt would benefit from continued OT skilled services. OT following acutely. BP 154/63 supine end of session    Follow Up Recommendations  CIR    Equipment Recommendations  3 in 1 bedside commode;Wheelchair (measurements OT);Wheelchair cushion (measurements OT)    Recommendations for Other Services Rehab consult     Precautions / Restrictions Precautions Precautions: Fall Precaution Comments: R extensor tone Restrictions Weight Bearing Restrictions: No      Mobility Bed Mobility Overal bed mobility: Needs Assistance Bed Mobility: Supine to Sit;Sit to Supine;Rolling Rolling: Mod assist   Supine to sit: Mod assist;+2 for physical assistance;+2 for safety/equipment;HOB elevated Sit to supine: Mod assist;+2 for physical assistance;+2 for safety/equipment   General bed mobility comments: ModA +2 for  managing RLE to move; LLE trouble sequencing.    Transfers Overall transfer level: Needs assistance Equipment used: 2 person hand held assist Transfers: Sit to/from [employer] Sit to Stand: Max assist;+2 physical assistance;+2 safety/equipment Stand pivot transfers: Max assist;+2 physical assistance;+2 safety/equipment       General transfer comment: Sit to stand 1x from EOB and 1x from commode with stand step transfers bed <> commode. R knee block, bil HHA, and maxAx2 for weight shifting and to advance steps.    Balance Overall balance assessment: Needs assistance Sitting-balance support: Single extremity supported;Feet supported Sitting balance-Leahy Scale: Poor Sitting balance - Comments: Reliant on L UE support and min-modA to prevent LOB sitting statically.   Standing balance support: Bilateral upper extremity supported;During functional activity Standing balance-Leahy Scale: Zero Standing balance comment: Reliant on bil UE support, R knee block, and maxAx2 for standing mobility.                           ADL either performed or assessed with clinical judgement   ADL Overall ADL's : Needs assistance/impaired Eating/Feeding: Minimal assistance;Bed level   Grooming: Moderate assistance;Sitting   Upper Body Bathing: Moderate assistance;Sitting   Lower Body Bathing: Maximal assistance;Sitting/lateral leans;Sit to/from stand   Upper Body Dressing : Moderate assistance;Sitting   Lower Body Dressing: Maximal assistance;Sitting/lateral leans;Sit to/from stand   Toilet Transfer: Moderate assistance   Toileting- Clothing Manipulation and Hygiene: Total assistance;Sit to/from stand Toileting - Clothing Manipulation Details (indicate cue type and reason): attempting to perform task at commode; unable to let go of therapist or bed to attempt pericare.     Functional mobility during ADLs: Maximal assistance;+2 for physical assistance;+2  for  safety/equipment (modA+2 for bed mobility; maxA+2 for stand pivot transfers.) General ADL Comments: Pt very anxious upon arrival requiring redirection due to deficits in decreased cognition, decreased ability to care for self and decreased activity tolerance. Pt following 50% of commands.     Vision Baseline Vision/History: Wears glasses Wears Glasses:  (contacts in) Patient Visual Report: Other (comment) (unable to attend to task for scanning task; pt unable to find remote in her hand.) Vision Assessment?: No apparent visual deficits     Perception Perception Perception Tested?: Yes Perception Deficits: Inattention/neglect Comments: ?R side inattention   Praxis      Pertinent Vitals/Pain Pain Assessment: Faces Faces Pain Scale: Hurts whole lot Pain Location: R hip Pain Descriptors / Indicators: Discomfort;Grimacing;Guarding Pain Intervention(s): Monitored during session     Hand Dominance Right   Extremity/Trunk Assessment Upper Extremity Assessment Upper Extremity Assessment: RUE deficits/detail;LUE deficits/detail RUE Deficits / Details: numbness, weakness 3-/5 grossly RUE Coordination: decreased fine motor;decreased gross motor LUE Deficits / Details: strength fair 3+/5; AROM, WNLs   Lower Extremity Assessment Lower Extremity Assessment: RLE deficits/detail RLE Deficits / Details: Bruising noted; Extensor tone noted; weakness; numbness RLE Sensation: decreased light touch;decreased proprioception RLE Coordination: decreased fine motor;decreased gross motor   Cervical / Trunk Assessment Cervical / Trunk Assessment: Kyphotic   Communication Communication Communication: No difficulties   Cognition Arousal/Alertness: Awake/alert Behavior During Therapy: Anxious;Impulsive Overall Cognitive Status: Impaired/Different from baseline Area of Impairment: Orientation;Attention;Memory;Following commands;Safety/judgement;Awareness;Problem solving                  Orientation Level: Disoriented to;Time (knew she just had her birthday but disoriented to specific date) Current Attention Level: Selective Memory: Decreased short-term memory Following Commands: Follows one step commands with increased time Safety/Judgement: Decreased awareness of safety;Decreased awareness of deficits Awareness: Intellectual Problem Solving: Slow processing;Decreased initiation;Difficulty sequencing;Requires verbal cues;Requires tactile cues General Comments: Constant cues for orientation to situation and time; pt on the phone with 911 from hospital bed unaware of situation. Pt requires redirection frequently. Pt requires mutlimodal cues to attend to task.   General Comments  BP 154/63 supine end of session    Exercises     Shoulder Instructions      Home Living Family/patient expects to be discharged to:: Private residence Living Arrangements: Alone Available Help at Discharge: Family;Available PRN/intermittently Type of Home: House Home Access: Stairs to enter [employer] of Steps: 5 Entrance Stairs-Rails: Right Home Layout: One level     Bathroom Shower/Tub: Producer, television/film/video: Standard     Home Equipment: Shower seat;Cane - quad;Cane - single point      Lives With: Alone    Prior Functioning/Environment Level of Independence: Independent        Comments: I use the furniture to walk; reports independence with ADL, medications, groceries.        OT Problem List: Decreased strength;Decreased activity tolerance;Impaired balance (sitting and/or standing);Impaired vision/perception;Decreased coordination;Decreased cognition;Decreased safety awareness;Pain;Impaired UE functional use;Decreased knowledge of use of DME or AE      OT Treatment/Interventions: Self-care/ADL training;Therapeutic exercise;Energy conservation;DME and/or AE instruction;Therapeutic activities;Cognitive remediation/compensation;Patient/family  education;Balance training;Visual/perceptual remediation/compensation    OT Goals(Current goals can be found in the care plan section) Acute Rehab OT Goals Patient Stated Goal: to get help to go to bathroom OT Goal Formulation: With patient Time For Goal Achievement: 03/29/21 Potential to Achieve Goals: Good ADL Goals Pt Will Perform Grooming: with set-up;sitting Pt Will Transfer to Toilet: with min assist;bedside commode Pt/caregiver will Perform Home Exercise  Program: Increased strength;With minimal assist;Right Upper extremity;With written HEP provided Additional ADL Goal #1: Pt will attend to tasks on R side 75 % of the time with minimal cues. Additional ADL Goal #2: pt will state and use 3 ADL items for intended purpose in 3/4 trials with minimal cues fro proper sequencing.  OT Frequency: Min 2X/week   Barriers to D/C: Decreased caregiver support  lives alone       Co-evaluation PT/OT/SLP Co-Evaluation/Treatment: Yes Reason for Co-Treatment: Complexity of the patient's impairments (multi-system involvement);For patient/therapist safety;To address functional/ADL transfers;Necessary to address cognition/behavior during functional activity PT goals addressed during session: Mobility/safety with mobility;Balance OT goals addressed during session: ADL's and self-care;Strengthening/ROM      AM-PAC OT "6 Clicks" Daily Activity     Outcome Measure Help from another person eating meals?: A Little Help from another person taking care of personal grooming?: A Little Help from another person toileting, which includes using toliet, bedpan, or urinal?: Total Help from another person bathing (including washing, rinsing, drying)?: A Lot Help from another person to put on and taking off regular upper body clothing?: A Little Help from another person to put on and taking off regular lower body clothing?: A Lot 6 Click Score: 14   End of Session Equipment Utilized During Treatment: Gait  belt Nurse Communication: Mobility status  Activity Tolerance: Patient tolerated treatment well Patient left: in bed;with call bell/phone within reach;with bed alarm set  OT Visit Diagnosis: Unsteadiness on feet (R26.81);Muscle weakness (generalized) (M62.81);Other symptoms and signs involving cognitive function;Pain                Time: 1433-1500 OT Time Calculation (min): 27 min Charges:  OT General Charges $OT Visit: 1 Visit OT Evaluation $OT Eval Moderate Complexity: 1 Mod Flora Lipps, OTR/L Acute Rehabilitation Services Pager: [phone] Office: [phone] Elan Brainerd C 03/15/2021, 4:33 PM

## 2021-03-15 NOTE — Progress Notes (Signed)
  Echocardiogram 2D Echocardiogram has been performed.  Jenna Powers 03/15/2021, 8:45 AM

## 2021-03-15 NOTE — Progress Notes (Signed)
[facility] PROGRESS NOTE    Progress Note  Kelie Gainey  QMG:[id number] DOB: [birth year] DOA: 03/14/2021 PCP: Dortha Kern, MD     Brief Narrative:   Jerrine Urschel is an 80 y.o. female past medical history significant for depression, history of CVA essential hypertension brought in to the ED after EMS found her on the floor on the right side covered in urine and feces CT of the head without contrast shows small bleed in the right frontal and periventricular area suspicious for metastatic process question traumatic.  Neurology was consulted.    Assessment/Plan:   Intraparenchymal hemorrhage of brain: Admitted to the ICU. MRI of the brain showed 8 mm acute hemorrhage within the deep white matter and a 2.8 acute hemorrhage of the left posterior frontal vertex with mild surrounding edema. No events on cardiac telemetry. 2D echo showed an EF of 60% with grade 3 diastolic heart failure right ventricular pressures were 77 mmHg, left atrium was mildly dilated.  With moderate tricuspid regurg Physical therapy Allow permissive hypertension with a goal less than 160 systolic blood pressure she is currently on lisinopril. IV labetalol as needed as needed Further management per the stroke team.    Rhabdomyolysis nontraumatic: Less Than 5000 she was started on IV fluids now 800. Continue IV fluids.  Acute kidney injury: Baseline creatinine around 1 on admission 1.8 in the setting of ARB use rhabdomyolysis and decreased oral intake. She was started on IV fluids this morning her creatinine is 1.6.  Leukocytosis: Likely due to intracranial bleed, slowly trending down. Has remained afebrile continue to monitor off antibiotics.  Elevated troponins: Denies any chest pain likely due to intracranial bleed.   Stage I  ulcer present on admission on the right buttock RN Pressure Injury Documentation: Pressure Injury 03/14/21 Buttocks Right Deep Tissue Pressure Injury - Purple or  maroon localized area of discolored intact skin or blood-filled blister due to damage of underlying soft tissue from pressure and/or shear. (Active)  03/14/21 1500  Location: Buttocks  Location Orientation: Right  Staging: Deep Tissue Pressure Injury - Purple or maroon localized area of discolored intact skin or blood-filled blister due to damage of underlying soft tissue from pressure and/or shear.  Wound Description (Comments):   Present on Admission: Yes    Estimated body mass index is 20.18 kg/m as calculated from the following:   Height as of 03/13/21: 5\' 6"  (1.676 m).   Weight as of 03/13/21: 56.7 kg.   DVT prophylaxis: none Family Communication:none Status is: Inpatient  Remains inpatient appropriate because:Hemodynamically unstable  Dispo: The patient is from: Home              Anticipated d/c is to: SNF              Patient currently is not medically stable to d/c.   Difficult to place patient No   Code Status:     Code Status Orders  (From admission, onward)           Start     Ordered   03/14/21 1656  Full code  Continuous        03/14/21 1656           Code Status History     Date Active Date Inactive Code Status Order ID Comments User Context   03/14/2021 0926 03/14/2021 1420 Full Code 03/16/2021  [id number], MD ED   03/14/2021 0639 03/14/2021 0926 Full Code 03/16/2021  [id number], MD ED  09/10/2019 1450 09/11/2019 2140 Full Code [id number]  Delfino LovettShah, Vipul, MD ED         IV Access:   Peripheral IV   Procedures and diagnostic studies:   DG Tibia/Fibula Right  Result Date: 03/13/2021 CLINICAL DATA:  Status post fall. EXAM: RIGHT TIBIA AND FIBULA - 2 VIEW COMPARISON:  None. FINDINGS: There is no evidence of an acute fracture. A small chronic appearing deformity is seen along the distal aspect of the right lateral malleolus. This represents a new finding when compared to the prior study. 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 Soft tissues are unremarkable. IMPRESSION: 1. No acute osseous abnormality. 2. Chronic and postoperative changes involving the right ankle, as described above. Electronically Signed   By: Aram Candelahaddeus  Houston M.D.   On: 03/13/2021 20:06   CT HEAD WO CONTRAST  Result Date: 03/13/2021 CLINICAL DATA:  Found on floor, right-sided weakness EXAM: CT HEAD WITHOUT CONTRAST CT CERVICAL SPINE WITHOUT CONTRAST TECHNIQUE: Multidetector CT imaging of the head and cervical spine was performed following the standard protocol without intravenous contrast. Multiplanar CT image reconstructions of the cervical spine were also generated. COMPARISON:  CT brain, 09/10/2019 FINDINGS: CT HEAD FINDINGS Brain: No evidence of acute infarction, hydrocephalus, extra-axial collection or mass lesion/mass effect. There is an intraparenchymal hemorrhage within the medial high left frontal lobe measuring 2.7 cm with adjacent edema (series 2, image 28). There is minimal associated subarachnoid hemorrhage in this vicinity (series 2, image 28). Approximately 4 mm left right midline shift. There is an additional very subtle cortical based hyperdense lesion of the medial high right frontal lobe measuring 0.4 cm (series 2, image 29). There is a hyperdense subependymal lesion about the posterior right lateral ventricle measuring 0.9 cm (series 2, image 18). Vascular: No hyperdense vessel or unexpected calcification. Skull: Normal. Negative for fracture or focal lesion. Sinuses/Orbits: No acute finding. Other: None. CT CERVICAL SPINE FINDINGS Alignment: Degenerative straightening and reversal of the normal cervical lordosis. Skull base and vertebrae: No acute fracture. No primary bone lesion or focal pathologic process. Soft tissues and spinal canal: No prevertebral fluid or swelling. No visible canal hematoma. Disc levels: Moderate to severe disc space height loss  and osteophytosis, worst from C4 through C6. Upper chest: Negative. Other: None. IMPRESSION: 1. There is an intraparenchymal hemorrhage within the medial high left frontal lobe measuring 2.7 cm with adjacent edema. There is minimal associated subarachnoid hemorrhage in this vicinity. 2. There is approximately 4 mm left right midline shift. 3. There is an additional very subtle cortical based hyperdense lesion of the medial high right frontal lobe measuring 0.4 cm, as well as a hyperdense subependymal lesion about the posterior right lateral ventricle measuring 0.9 cm. 4. Constellation of findings is generally suspicious for hemorrhage associated with metastatic lesions. Small emboli from a central source could also produce this appearance. Consider contrast enhanced MRI to further evaluate. 5. No fracture or static subluxation of the cervical spine. 6. Cervical disc degenerative disease. Findings discussed by telephone with Dr. Erma HeritageIsaacs at 6:50 p.m., 03/13/2021. Electronically Signed   By: Lauralyn PrimesAlex  Bibbey M.D.   On: 03/13/2021 18:53   CT Cervical Spine Wo Contrast  Result Date: 03/13/2021 CLINICAL DATA:  Found on floor, right-sided weakness EXAM: CT HEAD WITHOUT CONTRAST CT CERVICAL SPINE WITHOUT CONTRAST TECHNIQUE: Multidetector CT imaging of the head and cervical spine was performed following the standard protocol without intravenous contrast. Multiplanar CT  image reconstructions of the cervical spine were also generated. COMPARISON:  CT brain, 09/10/2019 FINDINGS: CT HEAD FINDINGS Brain: No evidence of acute infarction, hydrocephalus, extra-axial collection or mass lesion/mass effect. There is an intraparenchymal hemorrhage within the medial high left frontal lobe measuring 2.7 cm with adjacent edema (series 2, image 28). There is minimal associated subarachnoid hemorrhage in this vicinity (series 2, image 28). Approximately 4 mm left right midline shift. There is an additional very subtle cortical based  hyperdense lesion of the medial high right frontal lobe measuring 0.4 cm (series 2, image 29). There is a hyperdense subependymal lesion about the posterior right lateral ventricle measuring 0.9 cm (series 2, image 18). Vascular: No hyperdense vessel or unexpected calcification. Skull: Normal. Negative for fracture or focal lesion. Sinuses/Orbits: No acute finding. Other: None. CT CERVICAL SPINE FINDINGS Alignment: Degenerative straightening and reversal of the normal cervical lordosis. Skull base and vertebrae: No acute fracture. No primary bone lesion or focal pathologic process. Soft tissues and spinal canal: No prevertebral fluid or swelling. No visible canal hematoma. Disc levels: Moderate to severe disc space height loss and osteophytosis, worst from C4 through C6. Upper chest: Negative. Other: None. IMPRESSION: 1. There is an intraparenchymal hemorrhage within the medial high left frontal lobe measuring 2.7 cm with adjacent edema. There is minimal associated subarachnoid hemorrhage in this vicinity. 2. There is approximately 4 mm left right midline shift. 3. There is an additional very subtle cortical based hyperdense lesion of the medial high right frontal lobe measuring 0.4 cm, as well as a hyperdense subependymal lesion about the posterior right lateral ventricle measuring 0.9 cm. 4. Constellation of findings is generally suspicious for hemorrhage associated with metastatic lesions. Small emboli from a central source could also produce this appearance. Consider contrast enhanced MRI to further evaluate. 5. No fracture or static subluxation of the cervical spine. 6. Cervical disc degenerative disease. Findings discussed by telephone with Dr. [name] at 6:50 p.m., 03/13/2021. Electronically Signed   By: Lauralyn Primes M.D.   On: 03/13/2021 18:53   MR Brain W and Wo Contrast  Result Date: 03/14/2021 CLINICAL DATA:  Head trauma, moderate to severe. Found on the floor. Right-sided weakness. EXAM: MRI HEAD  WITHOUT AND WITH CONTRAST TECHNIQUE: Multiplanar, multiecho pulse sequences of the brain and surrounding structures were obtained without and with intravenous contrast. CONTRAST:  6mL GADAVIST GADOBUTROL 1 MMOL/ML IV SOLN COMPARISON:  Head CT yesterday.  MRI 09/10/2019. FINDINGS: Brain: The study suffers from considerable motion degradation. Diffusion imaging does not show any acute ischemic infarction. No focal abnormality is seen affecting the brainstem or cerebellum. There is an intraparenchymal hemorrhage in the deep white matter adjacent to the atrium of the right lateral ventricle as seen by CT, measuring approximately 8 mm in size. Second intraparenchymal hemorrhage at left posterior frontal vertex with maximal dimension of 2.8 cm. Both of these have not changed since the CT scan yesterday. Mild surrounding edema. 3-4 mm punctate hemorrhage is also noted at the right frontoparietal vertex and at the left posterior frontal region just anterior to the larger hemorrhage. These are actually more visible by CT. Brain otherwise appears normal for age without evidence of age accelerated atrophy or anything more than minimal small vessel change of the deep white [place] there is a small subdural effusion on the left, maximal thickness 6 mm. No acute blood products. Minimal mass effect with left-to-right shift 3 mm. After contrast administration, no abnormal enhancement is seen at the site of either hemorrhage or  elsewhere. No hydrocephalus. Vascular: Major vessels at the base of the brain show flow. Skull and upper cervical spine: Negative Sinuses/Orbits: Clear except for mucosal thickening and fluid in the right maxillary sinus. Orbits negative. Other: None IMPRESSION: 8 mm acute hemorrhage in the deep white matter adjacent to the atrium of the right lateral ventricle. 2.8 cm acute hemorrhage at the left posterior frontal vertex. Mild surrounding edema. 3-4 mm focal hemorrhages noted at the right frontoparietal vertex  and at the left posterior frontal vertex just anterior to the larger hemorrhage. These were more apparent at CT. No evidence of old hemorrhage. Minimal age related volume loss with very minimal small vessel change of the white matter. Left subdural effusion, maximal thickness 6 mm, with left-to-right shift of 3 mm. No abnormal contrast enhancement occurs in these locations or elsewhere. The study does not show any finding that would indicate hemorrhagic metastases. Therefore, these are felt to be synchronous intraparenchymal hemorrhages Electronically Signed   By: Paulina Fusi M.D.   On: 03/14/2021 10:32   CT CHEST ABDOMEN PELVIS W CONTRAST  Result Date: 03/14/2021 CLINICAL DATA:  Found down. On floor for 2 days. EXAM: CT CHEST, ABDOMEN, AND PELVIS WITH CONTRAST TECHNIQUE: Multidetector CT imaging of the chest, abdomen and pelvis was performed following the standard protocol during bolus administration of intravenous contrast. CONTRAST:  75mL OMNIPAQUE IOHEXOL 300 MG/ML  SOLN COMPARISON:  CT abdomen/pelvis from 10/03/2011 FINDINGS: CT CHEST FINDINGS Cardiovascular: The heart is within normal limits in size for age. No pericardial effusion. Advanced atherosclerotic calcifications involving the thoracic aorta. No dissection. Minimal scattered coronary artery calcifications. Mediastinum/Nodes: No mediastinal or hilar mass or adenopathy. The esophagus is grossly normal. Lungs/Pleura: Chronic appearing bronchitic type lung changes and mild underlying emphysema but no infiltrates, edema or effusions. No worrisome pulmonary lesions. No pneumothorax. Musculoskeletal: No breast masses, supraclavicular or axillary adenopathy. The thyroid gland is unremarkable. The bony thorax is intact. No bone lesions or fractures. Age related osteoporosis and degenerative changes involving the spine. CT ABDOMEN PELVIS FINDINGS Hepatobiliary: No hepatic lesions are identified. The gallbladder is surgically absent and there is associated  intra and extrahepatic biliary dilatation. The common bile duct measures up to 12 mm in the porta hepatis and 8 mm in the head of the pancreas. Pancreas: No mass, inflammation or ductal dilatation. Spleen: Normal size. No focal lesions. Adrenals/Urinary Tract: The adrenal glands are normal. Small/atrophic left kidney progressive since the prior study and likely due to renal artery stenosis. There is a stable large fatty mass involving the upper pole region of the right kidney. This measures a maximum of 5.0 x 4.0 cm but is unchanged. Findings consistent with a benign angiomyolipoma. The bladder is unremarkable. Stomach/Bowel: The stomach, duodenum, small bowel and colon are grossly normal without oral contrast. No acute inflammatory process, mass lesions or obstructive findings. Vascular/Lymphatic: Advanced atherosclerotic calcification involving the aorta and branch vessels but no aneurysm. No mesenteric or retroperitoneal mass or adenopathy. Reproductive: The uterus is surgically absent. Both ovaries are still present and are unremarkable and stable. Other: No pelvic mass or adenopathy. No free pelvic fluid collections. No inguinal mass or adenopathy. No abdominal wall hernia or subcutaneous lesions. Musculoskeletal: There is a moderate-sized intramuscular hematoma noted in the right gluteus maximus muscle. This measures a maximum 5.8 cm. There is also some associated subcutaneous hemorrhage and also some inflammation/edema or hemorrhage in the ischial rectal fossa on the right side. Other areas of subcutaneous inflammation/edema or hemorrhage are noted near the right  iliac crest and in the subcutaneous fat overlying the left buttock area. No acute pelvic or hip fractures are identified. Lumbar scoliosis and degenerative changes but no lumbar spine fractures. Both hips are normally located. Mild degenerative changes. 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 IMPRESSION: 1.  Moderate-sized intramuscular hematoma in the right gluteus maximus muscle with associated surrounding subcutaneous hemorrhage. 2. No acute pelvic or hip fractures. 3. Stable large right renal angiomyolipoma. 4. Status post cholecystectomy with associated stable intra and extrahepatic biliary dilatation. 5. Advanced atherosclerotic calcification involving the thoracic and abdominal aorta and branch vessels. 6. Chronic appearing bronchitic type lung changes and mild underlying emphysema. No acute pulmonary findings. Aortic Atherosclerosis (ICD10-I70.0) Electronically Signed   By: Rudie Meyer M.D.   On: 03/14/2021 06:25   DG Chest Portable 1 View  Result Date: 03/13/2021 CLINICAL DATA:  Status post fall. EXAM: PORTABLE CHEST 1 VIEW COMPARISON:  None. FINDINGS: There is no evidence of an acute infiltrate, pleural effusion or pneumothorax. The cardiac silhouette is mildly enlarged. Marked severity calcification of the aortic arch is seen. Mild S-shaped scoliosis of the thoracolumbar spine is noted with multilevel degenerative changes. IMPRESSION: Cardiomegaly without acute or active cardiopulmonary disease. Electronically Signed   By: Aram Candela M.D.   On: 03/13/2021 20:03   ECHOCARDIOGRAM COMPLETE  Result Date: 03/15/2021    ECHOCARDIOGRAM REPORT   Patient Name:   ROSAURA BOLON SAVAGE Date of Exam: 03/15/2021 Medical Rec #:  [id number]         Height:       66.0 in Accession #:    [id number]        Weight:       125.0 lb Date of Birth:  12/13/1940         BSA:          1.638 m Patient Age:    80 years          BP:           138/66 mmHg Patient Gender: F                 HR:           66 bpm. Exam Location:  Inpatient Procedure: 2D Echo, Cardiac Doppler and Color Doppler Indications:    CVA  History:        Patient has prior history of Echocardiogram examinations, most                 recent 09/10/2019. Stroke, Signs/Symptoms:CKD; Risk                 Factors:Hypertension, Dyslipidemia and Current Smoker.   Sonographer:    Lavenia Atlas Referring Phys: [id number] ALLISON WOLFE IMPRESSIONS  1. Left ventricular ejection fraction, by estimation, is 60 to 65%. The left ventricle has normal function. The left ventricle has no regional wall motion abnormalities. There is mild left ventricular hypertrophy. Left ventricular diastolic parameters are consistent with Grade III diastolic dysfunction (restrictive). Elevated left atrial pressure.  2. Right ventricular systolic function is normal. The right ventricular size is normal. There is severely elevated pulmonary artery systolic pressure. The estimated right ventricular systolic pressure is 77.3 mmHg.  3. Left atrial size was mildly dilated.  4. The mitral valve is normal in structure. Trivial mitral valve regurgitation. No evidence of mitral stenosis.  5. Tricuspid valve regurgitation is moderate.  6. The aortic valve is tricuspid. Aortic valve regurgitation is not visualized. No aortic stenosis is present.  7. The inferior vena cava is normal in size with greater than 50% respiratory variability, suggesting right atrial pressure of 3 mmHg. FINDINGS  Left Ventricle: Left ventricular ejection fraction, by estimation, is 60 to 65%. The left ventricle has normal function. The left ventricle has no regional wall motion abnormalities. The left ventricular internal cavity size was normal in size. There is  mild left ventricular hypertrophy. Left ventricular diastolic parameters are consistent with Grade III diastolic dysfunction (restrictive). Elevated left atrial pressure. Right Ventricle: The right ventricular size is normal. No increase in right ventricular wall thickness. Right ventricular systolic function is normal. There is severely elevated pulmonary artery systolic pressure. The tricuspid regurgitant velocity is 4.31 m/s, and with an assumed right atrial pressure of 3 mmHg, the estimated right ventricular systolic pressure is 77.3 mmHg. Left Atrium: Left atrial size was  mildly dilated. Right Atrium: Right atrial size was normal in size. Pericardium: Trivial pericardial effusion is present. Mitral Valve: The mitral valve is normal in structure. Trivial mitral valve regurgitation. No evidence of mitral valve stenosis. Tricuspid Valve: The tricuspid valve is normal in structure. Tricuspid valve regurgitation is moderate. Aortic Valve: The aortic valve is tricuspid. Aortic valve regurgitation is not visualized. No aortic stenosis is present. Pulmonic Valve: The pulmonic valve was not well visualized. Pulmonic valve regurgitation is not visualized. Aorta: The aortic root and ascending aorta are structurally normal, with no evidence of dilitation. Venous: The inferior vena cava is normal in size with greater than 50% respiratory variability, suggesting right atrial pressure of 3 mmHg. IAS/Shunts: The interatrial septum was not well visualized.  LEFT VENTRICLE PLAX 2D LVIDd:         3.90 cm  Diastology LVIDs:         2.60 cm  LV e' medial:    5.66 cm/s LV PW:         1.10 cm  LV E/e' medial:  18.9 LV IVS:        1.30 cm  LV e' lateral:   9.14 cm/s LVOT diam:     2.00 cm  LV E/e' lateral: 11.7 LV SV:         74 LV SV Index:   45 LVOT Area:     3.14 cm  RIGHT VENTRICLE RV Basal diam:  3.20 cm RV S prime:     12.30 cm/s TAPSE (M-mode): 2.1 cm LEFT ATRIUM             Index       RIGHT ATRIUM           Index LA diam:        2.40 cm 1.47 cm/m  RA Area:     14.80 cm LA Vol (A2C):   53.9 ml 32.91 ml/m RA Volume:   35.70 ml  21.80 ml/m LA Vol (A4C):   56.2 ml 34.32 ml/m LA Biplane Vol: 58.7 ml 35.84 ml/m  AORTIC VALVE LVOT Vmax:   115.00 cm/s LVOT Vmean:  65.200 cm/s LVOT VTI:    0.237 m  AORTA Ao Root diam: 2.90 cm MITRAL VALVE                TRICUSPID VALVE MV Area (PHT): 4.01 cm     TR Peak grad:   74.3 mmHg MV Decel Time: 189 msec     TR Vmax:        431.00 cm/s MV E velocity: 107.00 cm/s MV A velocity: 45.30 cm/s   SHUNTS MV E/A ratio:  2.36  Systemic VTI:  0.24 m                              Systemic Diam: 2.00 cm Epifanio Lesches MD Electronically signed by Epifanio Lesches MD Signature Date/Time: 03/15/2021/10:07:04 AM    Final    DG Hip Unilat W or Wo Pelvis 2-3 Views Right  Result Date: 03/13/2021 CLINICAL DATA:  Status post fall. EXAM: DG HIP (WITH OR WITHOUT PELVIS) 2-3V RIGHT COMPARISON:  None. FINDINGS: There is no evidence of hip fracture or dislocation. There is no evidence of arthropathy or other focal bone abnormality. IMPRESSION: Negative. Electronically Signed   By: Aram Candela M.D.   On: 03/13/2021 20:06     Medical Consultants:   None.   Subjective:    Sharin Altidor having a headache no further complaints.  Objective:    Vitals:   03/15/21 0306 03/15/21 0330 03/15/21 0430 03/15/21 0701  BP: (!) 72/38 137/67 (!) 142/86 138/66  Pulse:   77 66  Resp: (!) 21 18 (!) 21 15  Temp: 97.6 F (36.4 C)   97.7 F (36.5 C)  TempSrc: Axillary   Oral  SpO2:   95% 92%   SpO2: 92 %   Intake/Output Summary (Last 24 hours) at 03/15/2021 1020 Last data filed at 03/14/2021 2359 Gross per 24 hour  Intake 274.06 ml  Output --  Net 274.06 ml   There were no vitals filed for this visit.  Exam: General exam: In no acute distress. Respiratory system: Good air movement and clear to auscultation. Cardiovascular system: S1 & S2 heard, RRR. No JVD. Gastrointestinal system: Abdomen is nondistended, soft and nontender.  Extremities: No pedal edema. Skin: No rashes, lesions or ulcers Data Reviewed:    Labs: Basic Metabolic Panel: Recent Labs  Lab 03/13/21 1806 03/14/21 0401  NA 134* 139  K 3.6 3.6  CL 97* 103  CO2 27 27  GLUCOSE 127* 112*  BUN 38* 43*  CREATININE 1.81* 1.65*  CALCIUM 9.3 8.7*  MG 1.9  --    GFR Estimated Creatinine Clearance: 23.5 mL/min (A) (by C-G formula based on SCr of 1.65 mg/dL (H)). Liver Function Tests: Recent Labs  Lab 03/13/21 1806  AST 73*  ALT 36  ALKPHOS 87  BILITOT 1.3*  PROT 7.2   ALBUMIN 4.0   No results for input(s): LIPASE, AMYLASE in the last 168 hours. No results for input(s): AMMONIA in the last 168 hours. Coagulation profile Recent Labs  Lab 03/13/21 1806 03/14/21 1021  INR 1.1 1.1   COVID-19 Labs  No results for input(s): DDIMER, FERRITIN, LDH, CRP in the last 72 hours.  Lab Results  Component Value Date   SARSCOV2NAA NEGATIVE 03/13/2021   SARSCOV2NAA NEGATIVE 09/10/2019    CBC: Recent Labs  Lab 03/13/21 1806 03/14/21 0401 03/14/21 1021  WBC 24.2* 15.3* 13.8*  NEUTROABS 20.1* 11.4*  --   HGB 12.5 10.6* 11.2*  HCT 36.8 31.5* 32.6*  MCV 93.6 95.2 95.6  PLT 428* 296 316   Cardiac Enzymes: Recent Labs  Lab 03/13/21 1806 03/14/21 0401  CKTOTAL 1,390* 817*   BNP (last 3 results) No results for input(s): PROBNP in the last 8760 hours. CBG: Recent Labs  Lab 03/14/21 1310  GLUCAP 102*   D-Dimer: No results for input(s): DDIMER in the last 72 hours. Hgb A1c: Recent Labs    03/14/21 1644  HGBA1C 5.7*   Lipid Profile: Recent Labs  03/14/21 1644  CHOL 225*  HDL 45  LDLCALC 136*  TRIG 221*  CHOLHDL 5.0   Thyroid function studies: No results for input(s): TSH, T4TOTAL, T3FREE, THYROIDAB in the last 72 hours.  Invalid input(s): FREET3 Anemia work up: No results for input(s): VITAMINB12, FOLATE, FERRITIN, TIBC, IRON, RETICCTPCT in the last 72 hours. Sepsis Labs: Recent Labs  Lab 03/13/21 1806 03/14/21 0401 03/14/21 1021  WBC 24.2* 15.3* 13.8*   Microbiology Recent Results (from the past 240 hour(s))  Resp Panel by RT-PCR (Flu A&B, Covid) Nasopharyngeal Swab     Status: None   Collection Time: 03/13/21  6:06 PM   Specimen: Nasopharyngeal Swab; Nasopharyngeal(NP) swabs in vial transport medium  Result Value Ref Range Status   SARS Coronavirus 2 by RT PCR NEGATIVE NEGATIVE Final    Comment: (NOTE) SARS-CoV-2 target nucleic acids are NOT DETECTED.  The SARS-CoV-2 RNA is generally detectable in upper  respiratory specimens during the acute phase of infection. The lowest concentration of SARS-CoV-2 viral copies this assay can detect is 138 copies/mL. A negative result does not preclude SARS-Cov-2 infection and should not be used as the sole basis for treatment or other patient management decisions. A negative result may occur with  improper specimen collection/handling, submission of specimen other than nasopharyngeal swab, presence of viral mutation(s) within the areas targeted by this assay, and inadequate number of viral copies(<138 copies/mL). A negative result must be combined with clinical observations, patient history, and epidemiological information. The expected result is Negative.  Fact Sheet for Patients:  BloggerCourse.com  Fact Sheet for Healthcare Providers:  SeriousBroker.it  This test is no t yet approved or cleared by the [place] FDA and  has been authorized for detection and/or diagnosis of SARS-CoV-2 by FDA under an Emergency Use Authorization (EUA). This EUA will remain  in effect (meaning this test can be used) for the duration of the COVID-19 declaration under Section 564(b)(1) of the Act, 21 U.S.C.section 360bbb-3(b)(1), unless the authorization is terminated  or revoked sooner.       Influenza A by PCR NEGATIVE NEGATIVE Final   Influenza B by PCR NEGATIVE NEGATIVE Final    Comment: (NOTE) The Xpert Xpress SARS-CoV-2/FLU/RSV plus assay is intended as an aid in the diagnosis of influenza from Nasopharyngeal swab specimens and should not be used as a sole basis for treatment. Nasal washings and aspirates are unacceptable for Xpert Xpress SARS-CoV-2/FLU/RSV testing.  Fact Sheet for Patients: BloggerCourse.com  Fact Sheet for Healthcare Providers: SeriousBroker.it  This test is not yet approved or cleared by the [place] FDA and has been  authorized for detection and/or diagnosis of SARS-CoV-2 by FDA under an Emergency Use Authorization (EUA). This EUA will remain in effect (meaning this test can be used) for the duration of the COVID-19 declaration under Section 564(b)(1) of the Act, 21 U.S.C. section 360bbb-3(b)(1), unless the authorization is terminated or revoked.  Performed at [facility], [address] [place], [place] [address]      Medications:    levothyroxine  25 mcg Oral q morning   lisinopril  20 mg Oral Daily   sertraline  100 mg Oral Daily   Continuous Infusions:    LOS: 1 day   Marinda Elk  [facility]  03/15/2021, 10:20 AM

## 2021-03-15 NOTE — Progress Notes (Addendum)
STROKE TEAM PROGRESS NOTE   INTERVAL HISTORY No family is at the bedside.  Pt awake alert and orientated. Still has right UE and LE weakness. 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 She said she was told to have AD by one doctor but no AD but another doctor. She was prescribed meds starting with "M" (?memantine) but she did not take it.     OBJECTIVE Vitals:   03/15/21 0306 03/15/21 0330 03/15/21 0430 03/15/21 0701  BP: (!) 72/38 137/67 (!) 142/86 138/66  Pulse:   77 66  Resp: (!) 21 18 (!) 21 15  Temp: 97.6 F (36.4 C)   97.7 F (36.5 C)  TempSrc: Axillary   Oral  SpO2:   95% 92%    CBC:  Recent Labs  Lab 03/13/21 1806 03/14/21 0401 03/14/21 1021  WBC 24.2* 15.3* 13.8*  NEUTROABS 20.1* 11.4*  --   HGB 12.5 10.6* 11.2*  HCT 36.8 31.5* 32.6*  MCV 93.6 95.2 95.6  PLT 428* 296 316    Basic Metabolic Panel:  Recent Labs  Lab 03/13/21 1806 03/14/21 0401  NA 134* 139  K 3.6 3.6  CL 97* 103  CO2 27 27  GLUCOSE 127* 112*  BUN 38* 43*  CREATININE 1.81* 1.65*  CALCIUM 9.3 8.7*  MG 1.9  --     Lipid Panel:     Component Value Date/Time   CHOL 225 (H) 03/14/2021 1644   CHOL 217 (H) 06/14/2013 0125   TRIG 221 (H) 03/14/2021 1644   TRIG 245 (H) 06/14/2013 0125   HDL 45 03/14/2021 1644   HDL 44 06/14/2013 0125   CHOLHDL 5.0 03/14/2021 1644   VLDL 44 (H) 03/14/2021 1644   VLDL 49 (H) 06/14/2013 0125   LDLCALC 136 (H) 03/14/2021 1644   LDLCALC 124 (H) 06/14/2013 0125   HgbA1c:  Lab Results  Component Value Date   HGBA1C 5.7 (H) 03/14/2021   Urine Drug Screen:     Component Value Date/Time   LABOPIA NONE DETECTED 03/14/2021 1021   COCAINSCRNUR NONE DETECTED 03/14/2021 1021   LABBENZ NONE DETECTED 03/14/2021 1021   AMPHETMU NONE DETECTED 03/14/2021 1021   THCU NONE DETECTED 03/14/2021 1021   LABBARB NONE DETECTED 03/14/2021 1021    Alcohol Level No results found for: [facility]  IMAGING   DG Tibia/Fibula Right  Result Date:  03/13/2021 CLINICAL DATA:  Status post fall. EXAM: RIGHT TIBIA AND FIBULA - 2 VIEW COMPARISON:  None. FINDINGS: There is no evidence of an acute fracture. A small chronic appearing deformity is seen along the distal aspect of the right lateral malleolus. This represents a new finding when compared to the prior study. 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 Soft tissues are unremarkable. IMPRESSION: 1. No acute osseous abnormality. 2. Chronic and postoperative changes involving the right ankle, as described above. Electronically Signed   By: Aram Candela M.D.   On: 03/13/2021 20:06   CT HEAD WO CONTRAST  Result Date: 03/13/2021 CLINICAL DATA:  Found on floor, right-sided weakness EXAM: CT HEAD WITHOUT CONTRAST CT CERVICAL SPINE WITHOUT CONTRAST TECHNIQUE: Multidetector CT imaging of the head and cervical spine was performed following the standard protocol without intravenous contrast. Multiplanar CT image reconstructions of the cervical spine were also generated. COMPARISON:  CT brain, 09/10/2019 FINDINGS: CT HEAD FINDINGS Brain: No evidence of acute infarction, hydrocephalus, extra-axial collection or mass lesion/mass effect. There is  an intraparenchymal hemorrhage within the medial high left frontal lobe measuring 2.7 cm with adjacent edema (series 2, image 28). There is minimal associated subarachnoid hemorrhage in this vicinity (series 2, image 28). Approximately 4 mm left right midline shift. There is an additional very subtle cortical based hyperdense lesion of the medial high right frontal lobe measuring 0.4 cm (series 2, image 29). There is a hyperdense subependymal lesion about the posterior right lateral ventricle measuring 0.9 cm (series 2, image 18). Vascular: No hyperdense vessel or unexpected calcification. Skull: Normal. Negative for fracture or focal lesion. Sinuses/Orbits: No acute  finding. Other: None. CT CERVICAL SPINE FINDINGS Alignment: Degenerative straightening and reversal of the normal cervical lordosis. Skull base and vertebrae: No acute fracture. No primary bone lesion or focal pathologic process. Soft tissues and spinal canal: No prevertebral fluid or swelling. No visible canal hematoma. Disc levels: Moderate to severe disc space height loss and osteophytosis, worst from C4 through C6. Upper chest: Negative. Other: None. IMPRESSION: 1. There is an intraparenchymal hemorrhage within the medial high left frontal lobe measuring 2.7 cm with adjacent edema. There is minimal associated subarachnoid hemorrhage in this vicinity. 2. There is approximately 4 mm left right midline shift. 3. There is an additional very subtle cortical based hyperdense lesion of the medial high right frontal lobe measuring 0.4 cm, as well as a hyperdense subependymal lesion about the posterior right lateral ventricle measuring 0.9 cm. 4. Constellation of findings is generally suspicious for hemorrhage associated with metastatic lesions. Small emboli from a central source could also produce this appearance. Consider contrast enhanced MRI to further evaluate. 5. No fracture or static subluxation of the cervical spine. 6. Cervical disc degenerative disease. Findings discussed by telephone with Dr. Erma HeritageIsaacs at 6:50 p.m., 03/13/2021. Electronically Signed   By: Lauralyn PrimesAlex  Bibbey M.D.   On: 03/13/2021 18:53   CT Cervical Spine Wo Contrast  Result Date: 03/13/2021 CLINICAL DATA:  Found on floor, right-sided weakness EXAM: CT HEAD WITHOUT CONTRAST CT CERVICAL SPINE WITHOUT CONTRAST TECHNIQUE: Multidetector CT imaging of the head and cervical spine was performed following the standard protocol without intravenous contrast. Multiplanar CT image reconstructions of the cervical spine were also generated. COMPARISON:  CT brain, 09/10/2019 FINDINGS: CT HEAD FINDINGS Brain: No evidence of acute infarction, hydrocephalus,  extra-axial collection or mass lesion/mass effect. There is an intraparenchymal hemorrhage within the medial high left frontal lobe measuring 2.7 cm with adjacent edema (series 2, image 28). There is minimal associated subarachnoid hemorrhage in this vicinity (series 2, image 28). Approximately 4 mm left right midline shift. There is an additional very subtle cortical based hyperdense lesion of the medial high right frontal lobe measuring 0.4 cm (series 2, image 29). There is a hyperdense subependymal lesion about the posterior right lateral ventricle measuring 0.9 cm (series 2, image 18). Vascular: No hyperdense vessel or unexpected calcification. Skull: Normal. Negative for fracture or focal lesion. Sinuses/Orbits: No acute finding. Other: None. CT CERVICAL SPINE FINDINGS Alignment: Degenerative straightening and reversal of the normal cervical lordosis. Skull base and vertebrae: No acute fracture. No primary bone lesion or focal pathologic process. Soft tissues and spinal canal: No prevertebral fluid or swelling. No visible canal hematoma. Disc levels: Moderate to severe disc space height loss and osteophytosis, worst from C4 through C6. Upper chest: Negative. Other: None. IMPRESSION: 1. There is an intraparenchymal hemorrhage within the medial high left frontal lobe measuring 2.7 cm with adjacent edema. There is minimal associated subarachnoid hemorrhage in this vicinity. 2. There is  approximately 4 mm left right midline shift. 3. There is an additional very subtle cortical based hyperdense lesion of the medial high right frontal lobe measuring 0.4 cm, as well as a hyperdense subependymal lesion about the posterior right lateral ventricle measuring 0.9 cm. 4. Constellation of findings is generally suspicious for hemorrhage associated with metastatic lesions. Small emboli from a central source could also produce this appearance. Consider contrast enhanced MRI to further evaluate. 5. No fracture or static  subluxation of the cervical spine. 6. Cervical disc degenerative disease. Findings discussed by telephone with Dr. [name] at 6:50 p.m., 03/13/2021. Electronically Signed   By: Lauralyn Primes M.D.   On: 03/13/2021 18:53   MR Brain W and Wo Contrast  Result Date: 03/14/2021 CLINICAL DATA:  Head trauma, moderate to severe. Found on the floor. Right-sided weakness. EXAM: MRI HEAD WITHOUT AND WITH CONTRAST TECHNIQUE: Multiplanar, multiecho pulse sequences of the brain and surrounding structures were obtained without and with intravenous contrast. CONTRAST:  6mL GADAVIST GADOBUTROL 1 MMOL/ML IV SOLN COMPARISON:  Head CT yesterday.  MRI 09/10/2019. FINDINGS: Brain: The study suffers from considerable motion degradation. Diffusion imaging does not show any acute ischemic infarction. No focal abnormality is seen affecting the brainstem or cerebellum. There is an intraparenchymal hemorrhage in the deep white matter adjacent to the atrium of the right lateral ventricle as seen by CT, measuring approximately 8 mm in size. Second intraparenchymal hemorrhage at left posterior frontal vertex with maximal dimension of 2.8 cm. Both of these have not changed since the CT scan yesterday. Mild surrounding edema. 3-4 mm punctate hemorrhage is also noted at the right frontoparietal vertex and at the left posterior frontal region just anterior to the larger hemorrhage. These are actually more visible by CT. Brain otherwise appears normal for age without evidence of age accelerated atrophy or anything more than minimal small vessel change of the deep white [place] there is a small subdural effusion on the left, maximal thickness 6 mm. No acute blood products. Minimal mass effect with left-to-right shift 3 mm. After contrast administration, no abnormal enhancement is seen at the site of either hemorrhage or elsewhere. No hydrocephalus. Vascular: Major vessels at the base of the brain show flow. Skull and upper cervical spine: Negative  Sinuses/Orbits: Clear except for mucosal thickening and fluid in the right maxillary sinus. Orbits negative. Other: None IMPRESSION: 8 mm acute hemorrhage in the deep white matter adjacent to the atrium of the right lateral ventricle. 2.8 cm acute hemorrhage at the left posterior frontal vertex. Mild surrounding edema. 3-4 mm focal hemorrhages noted at the right frontoparietal vertex and at the left posterior frontal vertex just anterior to the larger hemorrhage. These were more apparent at CT. No evidence of old hemorrhage. Minimal age related volume loss with very minimal small vessel change of the white matter. Left subdural effusion, maximal thickness 6 mm, with left-to-right shift of 3 mm. No abnormal contrast enhancement occurs in these locations or elsewhere. The study does not show any finding that would indicate hemorrhagic metastases. Therefore, these are felt to be synchronous intraparenchymal hemorrhages Electronically Signed   By: Paulina Fusi M.D.   On: 03/14/2021 10:32   CT CHEST ABDOMEN PELVIS W CONTRAST  Result Date: 03/14/2021 CLINICAL DATA:  Found down. On floor for 2 days. EXAM: CT CHEST, ABDOMEN, AND PELVIS WITH CONTRAST TECHNIQUE: Multidetector CT imaging of the chest, abdomen and pelvis was performed following the standard protocol during bolus administration of intravenous contrast. CONTRAST:  75mL OMNIPAQUE IOHEXOL  300 MG/ML  SOLN COMPARISON:  CT abdomen/pelvis from 10/03/2011 FINDINGS: CT CHEST FINDINGS Cardiovascular: The heart is within normal limits in size for age. No pericardial effusion. Advanced atherosclerotic calcifications involving the thoracic aorta. No dissection. Minimal scattered coronary artery calcifications. Mediastinum/Nodes: No mediastinal or hilar mass or adenopathy. The esophagus is grossly normal. Lungs/Pleura: Chronic appearing bronchitic type lung changes and mild underlying emphysema but no infiltrates, edema or effusions. No worrisome pulmonary lesions. No  pneumothorax. Musculoskeletal: No breast masses, supraclavicular or axillary adenopathy. The thyroid gland is unremarkable. The bony thorax is intact. No bone lesions or fractures. Age related osteoporosis and degenerative changes involving the spine. CT ABDOMEN PELVIS FINDINGS Hepatobiliary: No hepatic lesions are identified. The gallbladder is surgically absent and there is associated intra and extrahepatic biliary dilatation. The common bile duct measures up to 12 mm in the porta hepatis and 8 mm in the head of the pancreas. Pancreas: No mass, inflammation or ductal dilatation. Spleen: Normal size. No focal lesions. Adrenals/Urinary Tract: The adrenal glands are normal. Small/atrophic left kidney progressive since the prior study and likely due to renal artery stenosis. There is a stable large fatty mass involving the upper pole region of the right kidney. This measures a maximum of 5.0 x 4.0 cm but is unchanged. Findings consistent with a benign angiomyolipoma. The bladder is unremarkable. Stomach/Bowel: The stomach, duodenum, small bowel and colon are grossly normal without oral contrast. No acute inflammatory process, mass lesions or obstructive findings. Vascular/Lymphatic: Advanced atherosclerotic calcification involving the aorta and branch vessels but no aneurysm. No mesenteric or retroperitoneal mass or adenopathy. Reproductive: The uterus is surgically absent. Both ovaries are still present and are unremarkable and stable. Other: No pelvic mass or adenopathy. No free pelvic fluid collections. No inguinal mass or adenopathy. No abdominal wall hernia or subcutaneous lesions. Musculoskeletal: There is a moderate-sized intramuscular hematoma noted in the right gluteus maximus muscle. This measures a maximum 5.8 cm. There is also some associated subcutaneous hemorrhage and also some inflammation/edema or hemorrhage in the ischial rectal fossa on the right side. Other areas of subcutaneous inflammation/edema  or hemorrhage are noted near the right iliac crest and in the subcutaneous fat overlying the left buttock area. No acute pelvic or hip fractures are identified. Lumbar scoliosis and degenerative changes but no lumbar spine fractures. Both hips are normally located. Mild degenerative changes. 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 IMPRESSION: 1. Moderate-sized intramuscular hematoma in the right gluteus maximus muscle with associated surrounding subcutaneous hemorrhage. 2. No acute pelvic or hip fractures. 3. Stable large right renal angiomyolipoma. 4. Status post cholecystectomy with associated stable intra and extrahepatic biliary dilatation. 5. Advanced atherosclerotic calcification involving the thoracic and abdominal aorta and branch vessels. 6. Chronic appearing bronchitic type lung changes and mild underlying emphysema. No acute pulmonary findings. Aortic Atherosclerosis (ICD10-I70.0) Electronically Signed   By: Rudie Meyer M.D.   On: 03/14/2021 06:25   DG Chest Portable 1 View  Result Date: 03/13/2021 CLINICAL DATA:  Status post fall. EXAM: PORTABLE CHEST 1 VIEW COMPARISON:  None. FINDINGS: There is no evidence of an acute infiltrate, pleural effusion or pneumothorax. The cardiac silhouette is mildly enlarged. Marked severity calcification of the aortic arch is seen. Mild S-shaped scoliosis of the thoracolumbar spine is noted with multilevel degenerative changes. IMPRESSION: Cardiomegaly without acute or active cardiopulmonary disease. Electronically Signed   By: Aram Candela M.D.   On: 03/13/2021 20:03   DG Hip Unilat W or Wo Pelvis 2-3 Views  Right  Result Date: 03/13/2021 CLINICAL DATA:  Status post fall. EXAM: DG HIP (WITH OR WITHOUT PELVIS) 2-3V RIGHT COMPARISON:  None. FINDINGS: There is no evidence of hip fracture or dislocation. There is no evidence of arthropathy or other focal bone abnormality. IMPRESSION: Negative. Electronically Signed   By: Aram Candela M.D.   On: 03/13/2021 20:06     Transthoracic Echocardiogram  00/00/2021 Pending   Bilateral Carotid Dopplers  00/00/2021 Pending   ECG - SB rate 45 BPM. (See cardiology reading for complete details)   PHYSICAL EXAM  Temp:  [96.7 F (35.9 C)-98.2 F (36.8 C)] 97.7 F (36.5 C) (07/27 1131) Pulse Rate:  [66-77] 66 (07/27 1131) Resp:  [15-21] 20 (07/27 1131) BP: (72-171)/(38-96) 171/66 (07/27 1131) SpO2:  [92 %-100 %] 97 % (07/27 1131)  General - Well nourished, well developed, in no apparent distress.  Ophthalmologic - fundi not visualized due to noncooperation.  Cardiovascular - Regular rhythm and rate.  Mental Status -  Level of arousal and orientation to time, place, and person were intact. Language including expression, naming, repetition, comprehension was assessed and found intact. Fund of Knowledge was assessed and was intact.  Cranial Nerves II - XII - II - Visual field intact OU. III, IV, VI - Extraocular movements intact. V - Facial sensation intact bilaterally. VII - mild right nasolabial fold flattening. VIII - Hearing & vestibular intact bilaterally. X - Palate elevates symmetrically. XI - Chin turning & shoulder shrug intact bilaterally. XII - Tongue protrusion intact.  Motor Strength - The patient's strength was normal in left upper and lower extremities however, right UE proximal 2-/5, 3+/5 hand grip, RLE proximal 0/5 and distal 0/5.  Bulk was normal and fasciculations were absent.   Motor Tone - Muscle tone was assessed at the neck and appendages and was normal.  Reflexes - The patient's reflexes were decreased on the right upper and lower extremities and she had no pathological reflexes.  Sensory - Light touch, temperature/pinprick were assessed and were symmetrical.    Coordination - The patient had normal movements in the left hand with no ataxia or dysmetria.  Tremor was absent.  Gait and Station -  deferred.    ASSESSMENT/PLAN [name] is a 80 y.o. female with history of depression, hyperlipidemia, hypertension, neuropathy, and thyroid disease and tremor who lives alone but found on her kitchen floor by family with a bottle full of small green or blue pills scattered around.  She did not receive IV t-PA due to ICH.  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 Very subtle cortical based hyperdense lesion of the medial high right frontal lobe measuring 0.4 cm, as well as a hyperdense subependymal lesion about the posterior right lateral ventricle measuring 0.9 cm.  MRI head W and wo 03/14/21 - 8 mm acute hemorrhage in the deep white matter adjacent to the atrium of the right lateral ventricle. 2.8 cm acute hemorrhage at the left posterior frontal vertex. Mild surrounding edema. 3-4 mm focal hemorrhages noted at the right frontoparietal vertex and at the left posterior frontal vertex just anterior to the larger hemorrhage. The study does not show any finding that would indicate hemorrhagic metastases. Therefore, these are felt to be synchronous intraparenchymal hemorrhages MRA head unremarkable Carotid Doppler - pending 2D Echo - EF 60-65% LDL - 136 HgbA1c - 5.7  UDS - negative VTE prophylaxis - heparin subq No antithrombotic prior to admission, now on No antithrombotic due to ICH and concerning for CAA Ongoing aggressive stroke risk factor management Therapy recommendations:  pending Disposition:  Pending  ? CAA MRI too much motion on GRE sequence CT/MRI 3 separate ICHs without abnormal enhancing ? AD diagnosis per pt Concerning for possible CAA  Hypertension Home BP meds: Zestril 40  Current BP meds: Lisinopril 20 daily -> twice daily Stable on the high end SBP goal < 160 mm Hg given concern for CAA   Hyperlipidemia Home Lipid lowering  medication: none LDL 136, goal < 70 Current lipid lowering medication: none for acute ICH Continue low dose statin at discharge  Other Stroke Risk Factors Advanced age Former cigarette smoker - quit Former daily ETOH use Family hx stroke (Father)   Other Active Problems Code status - Full code Leukocytosis - WBC's -  24.2->15.3->13.8 (afebrile)  CKD - stage 3b - creatinine - 1.81->1.65  Depression - on zoloft   Hospital day # 1  I spent  35 minutes in total face-to-face time with the patient, more than 50% of which was spent in counseling and coordination of care, reviewing test results, images and medication, and discussing the diagnosis, treatment plan and potential prognosis. This patient's care requiresreview of multiple databases, neurological assessment, discussion with family, other specialists and medical decision making of high complexity.    Marvel Plan, MD PhD Stroke Neurology 03/15/2021 3:18 PM   To contact Stroke Continuity provider, please refer to [email or web address]. After hours, contact General Neurology

## 2021-03-15 NOTE — Progress Notes (Signed)
Carotid duplex has been completed.   Preliminary results in CV Proc.   Blanch Media 03/15/2021 2:27 PM

## 2021-03-15 NOTE — Evaluation (Signed)
Physical Therapy Evaluation Patient Details Name: Jenna Powers MRN: [id number] DOB: [birth year] Today's Date: 03/15/2021   History of Present Illness  80 yo female presented to ED 03/13/21 after being found on the floor with R-sided weakness. Pt is a transfer from [facility]. CT scan of the brain shows an acute intraparenchymal hemorrhage within the medial high right frontal lobe with adjacent edema as well as a left to right midline shift. MRI: 8 mm acute hemorrhage adjacent to R lateral ventricle, 2.8 cm hemorrhage L posterior frontal vertex, 3-4 MM focal hemorhages R frontoparietal and L posterior frontal vertex, L subdural effusion with left-to-right shift of 3 mm.  PMH OA depression HLD HTN neuropathy thyroid disease tremor   Clinical Impression  Pt presents with condition above and deficits mentioned below, see PT Problem List. PTA, she was independent, using the walls/furniture for support with mobility, driving, and living alone in a 1-level house with 5 STE with 1 handrail. Currently, pt displays decreased R-sided sensation, proprioception, strength, and coordination along with extensor tone. Pt also with decreased activity tolerance, cognitive deficits, and balance deficits. She is at high risk for falls and has poor safety awareness. Pt is requiring modAx2 for bed mobility and maxAx2 for transfers and to take a few lateral steps this date. Recommending CIR consult. Will continue to follow acutely.    Follow Up Recommendations CIR;Supervision/Assistance - 24 hour    Equipment Recommendations  3in1 (PT);Wheelchair (measurements PT);Wheelchair cushion (measurements PT);Hospital bed;Other (comment) (may change with progression)    Recommendations for Other Services Rehab consult     Precautions / Restrictions Precautions Precautions: Fall Precaution Comments: R extensor tone Restrictions Weight Bearing Restrictions: No      Mobility  Bed Mobility Overal bed mobility: Needs  Assistance Bed Mobility: Supine to Sit;Sit to Supine;Rolling Rolling: Mod assist   Supine to sit: Mod assist;+2 for physical assistance;+2 for safety/equipment;HOB elevated Sit to supine: Mod assist;+2 for physical assistance;+2 for safety/equipment   General bed mobility comments: Cues provided along with physical assistance to flex R knee and direct rolling towards L. ModAx2 to manage legs and initiate trunk ascent to sit up, with pt displaying attempt to push up from bed with UE. Pt initiating return to supine through leaning laterally onto L elbow.    Transfers Overall transfer level: Needs assistance Equipment used: 2 person hand held assist Transfers: Sit to/from [employer] Sit to Stand: Max assist;+2 physical assistance;+2 safety/equipment Stand pivot transfers: Max assist;+2 physical assistance;+2 safety/equipment       General transfer comment: Sit to stand 1x from EOB and 1x from commode with stand step transfers bed <> commode. R knee block, bil HHA, and maxAx2 provided to sequence weight shifting, lift R leg to advance it, and direct buttocks to/from surfaces.  Ambulation/Gait Ambulation/Gait assistance: Max assist;+2 physical assistance;+2 safety/equipment Gait Distance (Feet): 2 Feet Assistive device: 2 person hand held assist Gait Pattern/deviations: Step-to pattern;Decreased step length - right;Decreased stride length;Decreased weight shift to right;Decreased weight shift to left;Decreased dorsiflexion - right;Scissoring;Trunk flexed Gait velocity: reduced Gait velocity interpretation: <1.31 ft/sec, indicative of household ambulator General Gait Details: Pt with min-no initiation of sequencing, needing maxAx2 to shift weight laterally to advance contralateral leg. Needed assistance to physically lift R leg to advance it, displaying scissoring at times. R knee block and bil HHA provided.  Stairs            Wheelchair Mobility    Modified Rankin  (Stroke Patients Only) Modified Rankin (Stroke Patients Only)  Pre-Morbid Rankin Score: No significant disability Modified Rankin: Moderately severe disability     Balance Overall balance assessment: Needs assistance Sitting-balance support: Single extremity supported;Feet supported Sitting balance-Leahy Scale: Poor Sitting balance - Comments: Reliant on L UE support and min-modA to prevent LOB sitting statically.   Standing balance support: Bilateral upper extremity supported;During functional activity Standing balance-Leahy Scale: Zero Standing balance comment: Reliant on bil UE support, R knee block, and maxAx2 for standing mobility.                             Pertinent Vitals/Pain Pain Assessment: Faces Faces Pain Scale: Hurts whole lot Pain Location: R hip Pain Descriptors / Indicators: Discomfort;Grimacing;Guarding Pain Intervention(s): Limited activity within patient's tolerance;Monitored during session;Repositioned    Home Living Family/patient expects to be discharged to:: Private residence Living Arrangements: Alone Available Help at Discharge: Family;Available PRN/intermittently Type of Home: House Home Access: Stairs to enter Entrance Stairs-Rails: Right Entrance Stairs-Number of Steps: 5 Home Layout: One level Home Equipment: Shower seat;Cane - quad;Cane - single point      Prior Function Level of Independence: Independent         Comments: I use the furniture to walk; reports independence with ADL, medications, groceries.     Hand Dominance   Dominant Hand: Right    Extremity/Trunk Assessment   Upper Extremity Assessment Upper Extremity Assessment: Defer to OT evaluation RUE Deficits / Details: numbness, weakness    Lower Extremity Assessment Lower Extremity Assessment: RLE deficits/detail RLE Deficits / Details: Bruising noted; Extensor tone noted; weakness; numbness RLE Sensation: decreased light touch;decreased  proprioception RLE Coordination: decreased fine motor;decreased gross motor    Cervical / Trunk Assessment Cervical / Trunk Assessment: Kyphotic  Communication   Communication: No difficulties  Cognition Arousal/Alertness: Awake/alert Behavior During Therapy: Anxious;Impulsive Overall Cognitive Status: Impaired/Different from baseline Area of Impairment: Orientation;Attention;Memory;Following commands;Safety/judgement;Awareness;Problem solving                 Orientation Level: Disoriented to;Time (knew she just had her birthday but disoriented to specific date) Current Attention Level: Selective Memory: Decreased short-term memory Following Commands: Follows one step commands with increased time Safety/Judgement: Decreased awareness of safety;Decreased awareness of deficits Awareness: Intellectual Problem Solving: Slow processing;Decreased initiation;Difficulty sequencing;Requires verbal cues;Requires tactile cues General Comments: Pt very anxious upon arrival, needing encouragement throughout and education on various roles of personnel in hospital. Poor attention span, needing to be redirected to listen to cues often. Pt with STM deficits, needing repeated cues to sequence tasks or to maintain a static position to ensure safety. Min-no sequencing initiation noted by pt.      General Comments General comments (skin integrity, edema, etc.): BP 154/63 supine end of session    Exercises     Assessment/Plan    PT Assessment Patient needs continued PT services  PT Problem List Decreased strength;Decreased range of motion;Decreased activity tolerance;Decreased balance;Decreased mobility;Decreased coordination;Decreased cognition;Decreased knowledge of use of DME;Decreased safety awareness;Impaired sensation;Impaired tone       PT Treatment Interventions DME instruction;Gait training;Stair training;Functional mobility training;Therapeutic activities;Therapeutic exercise;Balance  training;Neuromuscular re-education;Cognitive remediation;Patient/family education;Wheelchair mobility training    PT Goals (Current goals can be found in the Care Plan section)  Acute Rehab PT Goals Patient Stated Goal: to get help to go to bathroom PT Goal Formulation: With patient Time For Goal Achievement: 03/29/21 Potential to Achieve Goals: Fair    Frequency Min 4X/week   Barriers to discharge  Co-evaluation PT/OT/SLP Co-Evaluation/Treatment: Yes Reason for Co-Treatment: Complexity of the patient's impairments (multi-system involvement);Necessary to address cognition/behavior during functional activity;For patient/therapist safety;To address functional/ADL transfers PT goals addressed during session: Mobility/safety with mobility;Balance         AM-PAC PT "6 Clicks" Mobility  Outcome Measure Help needed turning from your back to your side while in a flat bed without using bedrails?: A Lot Help needed moving from lying on your back to sitting on the side of a flat bed without using bedrails?: Total Help needed moving to and from a bed to a chair (including a wheelchair)?: Total Help needed standing up from a chair using your arms (e.g., wheelchair or bedside chair)?: Total Help needed to walk in hospital room?: Total Help needed climbing 3-5 steps with a railing? : Total 6 Click Score: 7    End of Session Equipment Utilized During Treatment: Gait belt Activity Tolerance: Patient limited by fatigue Patient left: in bed;with call bell/phone within reach;with bed alarm set Nurse Communication: Mobility status PT Visit Diagnosis: Unsteadiness on feet (R26.81);Other abnormalities of gait and mobility (R26.89);Muscle weakness (generalized) (M62.81);Difficulty in walking, not elsewhere classified (R26.2);Apraxia (R48.2);Other symptoms and signs involving the nervous system (R29.898);Hemiplegia and hemiparesis Hemiplegia - Right/Left: Right Hemiplegia -  dominant/non-dominant: Dominant Hemiplegia - caused by: Nontraumatic intracerebral hemorrhage    Time: 1433-1459 PT Time Calculation (min) (ACUTE ONLY): 26 min   Charges:   PT Evaluation $PT Eval Moderate Complexity: 1 Mod          Raymond Gurney, PT, DPT Acute Rehabilitation Services  Pager: [phone] Office: [phone]   Jewel Baize 03/15/2021, 3:16 PM

## 2021-03-15 NOTE — Plan of Care (Signed)
  Problem: Self-Care: Goal: Ability to participate in self-care as condition permits will improve Outcome: Progressing   Problem: Intracerebral Hemorrhage Tissue Perfusion: Goal: Complications of Intracerebral Hemorrhage will be minimized Outcome: Progressing

## 2021-03-15 NOTE — Evaluation (Signed)
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)    Thyroid disease    Tremor    Past Surgical History:  Past Surgical History:  Procedure Laterality Date   ABDOMINAL HYSTERECTOMY     ANKLE SURGERY Right    HPI:  Jenna Powers is an 80 y.o. female past medical history significant for depression, history of CVA essential hypertension brought in to the ED after EMS found her on the floor on the right side covered in urine and feces. MRI of the brain showed 8 mm acute hemorrhage within the deep white matter and a 2.8 acute hemorrhage of the left posterior frontal vertex with mild surrounding edema.   Assessment / Plan / Recommendation Clinical Impression  Pt demonstrates no acute cognitive impairment. She is able to verbalize accurate short term memory, demonstrates higher level attention and awareness, problem solving with meal. Is already starting to address concerns of bills etc but expects son to provide assistance as needed. Pt does confirm a prior diagnosis of dementia with only mild cognitive impairment that has not impacted her independence so far. Do not recommend f/u SLP  therapy at this time. Will sign off.    SLP Assessment  SLP Recommendation/Assessment: Patient does not need any further Speech Lanaguage Pathology Services SLP Visit Diagnosis: Cognitive communication deficit (R41.841)    Follow Up Recommendations       Frequency and Duration           SLP Evaluation Cognition  Overall Cognitive Status: Within Functional Limits for tasks assessed Orientation Level: Oriented X4 Attention: Alternating Memory: Appears intact Awareness: Appears intact Problem Solving: Appears intact       Comprehension  Auditory Comprehension Overall Auditory Comprehension: Appears within functional limits for tasks assessed    Expression Verbal Expression Overall Verbal Expression: Appears within functional limits for tasks assessed   Oral / Motor  Oral Motor/Sensory Function Overall Oral Motor/Sensory Function: Within functional limits Motor Speech Overall Motor Speech: Appears within functional limits for tasks assessed   GO                    Jenna Powers, Riley Nearing 03/15/2021, 1:59 PM

## 2021-03-15 NOTE — Progress Notes (Signed)
Rehab Admissions Coordinator Note:  Patient was screened by Clois Dupes for appropriateness for an Inpatient Acute Rehab Consult.  At this time, we are recommending Inpatient Rehab consult. I will place order per protocol.  Clois Dupes RN MSN 03/15/2021, 8:44 PM  I can be reached at [phone].

## 2021-03-16 DIAGNOSIS — I680 Cerebral amyloid angiopathy: Secondary | ICD-10-CM

## 2021-03-16 MED ORDER — TRAMADOL HCL 50 MG PO TABS
50.0000 mg | ORAL_TABLET | Freq: Four times a day (QID) | ORAL | Status: DC | PRN
  Administered 2021-03-16 – 2021-03-21 (×11): 50 mg via ORAL
  Filled 2021-03-16 (×12): qty 1

## 2021-03-16 MED ORDER — LORAZEPAM 0.5 MG PO TABS
0.5000 mg | ORAL_TABLET | Freq: Two times a day (BID) | ORAL | Status: DC | PRN
  Administered 2021-03-16 – 2021-03-21 (×7): 0.5 mg via ORAL
  Filled 2021-03-16 (×7): qty 1

## 2021-03-16 MED ORDER — TRAZODONE HCL 100 MG PO TABS
200.0000 mg | ORAL_TABLET | Freq: Every day | ORAL | Status: DC
  Administered 2021-03-16 – 2021-03-20 (×5): 200 mg via ORAL
  Filled 2021-03-16 (×5): qty 2

## 2021-03-16 MED ORDER — ATORVASTATIN CALCIUM 10 MG PO TABS
20.0000 mg | ORAL_TABLET | Freq: Every day | ORAL | Status: DC
  Administered 2021-03-16 – 2021-03-21 (×6): 20 mg via ORAL
  Filled 2021-03-16 (×6): qty 2

## 2021-03-16 NOTE — Progress Notes (Signed)
  Inpatient Rehabilitation Admissions Coordinator  I discussed with son , [name] , by phone rehab goals and expectations of a CIR admit. Prior to admit patient living alone and independent. [name] is local but works 10 hrs per day and unable to provide the projected needed caregiver supports. He will contact his brother in [place] but feels he also unable to proved needed caregiver support. I therefore recommend SNF. I will contact acute team and TOC and we will sign off at this time for she does not have the projected caregiver support needed after a CIR admit.  Ottie Glazier, RN, MSN Rehab Admissions Coordinator [phone] 03/16/2021 3:44 PM

## 2021-03-16 NOTE — Plan of Care (Signed)
  Problem: Education: Goal: Knowledge of disease or condition will improve Outcome: Progressing Goal: Knowledge of secondary prevention will improve Outcome: Progressing Goal: Knowledge of patient specific risk factors addressed and post discharge goals established will improve Outcome: Progressing   Problem: Self-Care: Goal: Ability to participate in self-care as condition permits will improve Outcome: Progressing   Problem: Intracerebral Hemorrhage Tissue Perfusion: Goal: Complications of Intracerebral Hemorrhage will be minimized Outcome: Progressing   Problem: Spontaneous Subarachnoid Hemorrhage Tissue Perfusion: Goal: Complications of Spontaneous Subarachnoid Hemorrhage will be minimized Outcome: Progressing   Problem: Activity: Goal: Risk for activity intolerance will decrease Outcome: Progressing   Problem: Elimination: Goal: Will not experience complications related to bowel motility Outcome: Progressing Goal: Will not experience complications related to urinary retention Outcome: Progressing   Problem: Pain Managment: Goal: General experience of comfort will improve Outcome: Progressing

## 2021-03-16 NOTE — Progress Notes (Signed)
Inpatient Rehabilitation Admissions Coordinator   Inpatient rehab consult received. I have left voicemail for pt's son, [name], to contact me to discuss rehab venue options.  Ottie Glazier, RN, MSN Rehab Admissions Coordinator [phone] 03/16/2021 1:42 PM

## 2021-03-16 NOTE — Progress Notes (Signed)
STROKE TEAM PROGRESS NOTE   INTERVAL HISTORY No family is at the bedside.  Pt awake alert and orientated, lying in bed. Stated that she needs frequent shift positions with RN due to uncomfortable in one place. However, pre RN, pt frequently asking for change position but does not remember she was just changed 5 min ago and she did not remember PT/OT worked with her just 5-10 min ago.    OBJECTIVE Vitals:   03/15/21 2246 03/16/21 0329 03/16/21 0500 03/16/21 0817  BP: (!) 203/88 (!) 220/98 131/76 (!) 127/58  Pulse: 69 88  84  Resp: 18 20  20   Temp: 98.4 F (36.9 C) 98 F (36.7 C)  97.8 F (36.6 C)  TempSrc: Oral Oral  Oral  SpO2: 95%   97%    CBC:  Recent Labs  Lab 03/13/21 1806 03/14/21 0401 03/14/21 1021  WBC 24.2* 15.3* 13.8*  NEUTROABS 20.1* 11.4*  --   HGB 12.5 10.6* 11.2*  HCT 36.8 31.5* 32.6*  MCV 93.6 95.2 95.6  PLT 428* 296 316    Basic Metabolic Panel:  Recent Labs  Lab 03/13/21 1806 03/14/21 0401  NA 134* 139  K 3.6 3.6  CL 97* 103  CO2 27 27  GLUCOSE 127* 112*  BUN 38* 43*  CREATININE 1.81* 1.65*  CALCIUM 9.3 8.7*  MG 1.9  --     Lipid Panel:     Component Value Date/Time   CHOL 225 (H) 03/14/2021 1644   CHOL 217 (H) 06/14/2013 0125   TRIG 221 (H) 03/14/2021 1644   TRIG 245 (H) 06/14/2013 0125   HDL 45 03/14/2021 1644   HDL 44 06/14/2013 0125   CHOLHDL 5.0 03/14/2021 1644   VLDL 44 (H) 03/14/2021 1644   VLDL 49 (H) 06/14/2013 0125   LDLCALC 136 (H) 03/14/2021 1644   LDLCALC 124 (H) 06/14/2013 0125   HgbA1c:  Lab Results  Component Value Date   HGBA1C 5.7 (H) 03/14/2021   Urine Drug Screen:     Component Value Date/Time   LABOPIA NONE DETECTED 03/14/2021 1021   COCAINSCRNUR NONE DETECTED 03/14/2021 1021   LABBENZ NONE DETECTED 03/14/2021 1021   AMPHETMU NONE DETECTED 03/14/2021 1021   THCU NONE DETECTED 03/14/2021 1021   LABBARB NONE DETECTED 03/14/2021 1021    Alcohol Level No results found for: ETH  IMAGING   MR ANGIO  HEAD WO CONTRAST  Result Date: 03/15/2021 CLINICAL DATA:  Cerebral hemorrhage. EXAM: MRA HEAD WITHOUT CONTRAST TECHNIQUE: Angiographic images of the Circle of Willis were acquired using MRA technique without intravenous contrast. COMPARISON:  MRI 03/14/2021. FINDINGS: Anterior circulation: Bilateral intracranial ICAs, MCAs, and ACAs are patent without proximal flow limiting stenosis. Approximately 2-3 mm conical outpouching arising from the posterolateral right cavernous ICA (series 407, image 25; series 4, image 55 with suggestion of possible small vessel arising from the tip, favoring an infundibulum over aneurysm. No clear evidence of a vascular malformation in the region of the right periventricular hemorrhage, although acute blood products limits evaluation. Please note that the hemorrhages at the vertex are not imaged on this MRA. Posterior circulation: Left dominant intradural vertebral artery. Bilateral intradural vertebral arteries, basilar artery, and posterior cerebral arteries are patent without proximal hemodynamically significant stenosis. No aneurysm identified. IMPRESSION: 1. No large vessel occlusion or proximal hemodynamically significant stenosis. 2. No clear evidence of a vascular malformation in the region of the right periventricular hemorrhage, although acute blood products limits evaluation. Please note that the hemorrhages at the vertex are not  imaged on this MRA. 3. Approximately 2-3 mm conical outpouching arising from the posterolateral right distal cavernous ICA with suggestion of a small vessel arising from the tip, favoring infundibulum over aneurysm. A CTA could confirm if clinically indicated. Electronically Signed   By: Feliberto Harts MD   On: 03/15/2021 11:31   ECHOCARDIOGRAM COMPLETE  Result Date: 03/15/2021    ECHOCARDIOGRAM REPORT   Patient Name:   Jenna Powers Date of Exam: 03/15/2021 Medical Rec #:  [id number]         Height:       66.0 in Accession #:    [id number]         Weight:       125.0 lb Date of Birth:  October 23, 1940         BSA:          1.638 m Patient Age:    80 years          BP:           138/66 mmHg Patient Gender: F                 HR:           66 bpm. Exam Location:  Inpatient Procedure: 2D Echo, Cardiac Doppler and Color Doppler Indications:    CVA  History:        Patient has prior history of Echocardiogram examinations, most                 recent 09/10/2019. Stroke, Signs/Symptoms:CKD; Risk                 Factors:Hypertension, Dyslipidemia and Current Smoker.  Sonographer:    Lavenia Atlas Referring Phys: [id number] ALLISON WOLFE IMPRESSIONS  1. Left ventricular ejection fraction, by estimation, is 60 to 65%. The left ventricle has normal function. The left ventricle has no regional wall motion abnormalities. There is mild left ventricular hypertrophy. Left ventricular diastolic parameters are consistent with Grade III diastolic dysfunction (restrictive). Elevated left atrial pressure.  2. Right ventricular systolic function is normal. The right ventricular size is normal. There is severely elevated pulmonary artery systolic pressure. The estimated right ventricular systolic pressure is 77.3 mmHg.  3. Left atrial size was mildly dilated.  4. The mitral valve is normal in structure. Trivial mitral valve regurgitation. No evidence of mitral stenosis.  5. Tricuspid valve regurgitation is moderate.  6. The aortic valve is tricuspid. Aortic valve regurgitation is not visualized. No aortic stenosis is present.  7. The inferior vena cava is normal in size with greater than 50% respiratory variability, suggesting right atrial pressure of 3 mmHg. FINDINGS  Left Ventricle: Left ventricular ejection fraction, by estimation, is 60 to 65%. The left ventricle has normal function. The left ventricle has no regional wall motion abnormalities. The left ventricular internal cavity size was normal in size. There is  mild left ventricular hypertrophy. Left ventricular diastolic  parameters are consistent with Grade III diastolic dysfunction (restrictive). Elevated left atrial pressure. Right Ventricle: The right ventricular size is normal. No increase in right ventricular wall thickness. Right ventricular systolic function is normal. There is severely elevated pulmonary artery systolic pressure. The tricuspid regurgitant velocity is 4.31 m/s, and with an assumed right atrial pressure of 3 mmHg, the estimated right ventricular systolic pressure is 77.3 mmHg. Left Atrium: Left atrial size was mildly dilated. Right Atrium: Right atrial size was normal in size. Pericardium: Trivial pericardial effusion is present. Mitral Valve: The mitral valve is  normal in structure. Trivial mitral valve regurgitation. No evidence of mitral valve stenosis. Tricuspid Valve: The tricuspid valve is normal in structure. Tricuspid valve regurgitation is moderate. Aortic Valve: The aortic valve is tricuspid. Aortic valve regurgitation is not visualized. No aortic stenosis is present. Pulmonic Valve: The pulmonic valve was not well visualized. Pulmonic valve regurgitation is not visualized. Aorta: The aortic root and ascending aorta are structurally normal, with no evidence of dilitation. Venous: The inferior vena cava is normal in size with greater than 50% respiratory variability, suggesting right atrial pressure of 3 mmHg. IAS/Shunts: The interatrial septum was not well visualized.  LEFT VENTRICLE PLAX 2D LVIDd:         3.90 cm  Diastology LVIDs:         2.60 cm  LV e' medial:    5.66 cm/s LV PW:         1.10 cm  LV E/e' medial:  18.9 LV IVS:        1.30 cm  LV e' lateral:   9.14 cm/s LVOT diam:     2.00 cm  LV E/e' lateral: 11.7 LV SV:         74 LV SV Index:   45 LVOT Area:     3.14 cm  RIGHT VENTRICLE RV Basal diam:  3.20 cm RV S prime:     12.30 cm/s TAPSE (M-mode): 2.1 cm LEFT ATRIUM             Index       RIGHT ATRIUM           Index LA diam:        2.40 cm 1.47 cm/m  RA Area:     14.80 cm LA Vol (A2C):    53.9 ml 32.91 ml/m RA Volume:   35.70 ml  21.80 ml/m LA Vol (A4C):   56.2 ml 34.32 ml/m LA Biplane Vol: 58.7 ml 35.84 ml/m  AORTIC VALVE LVOT Vmax:   115.00 cm/s LVOT Vmean:  65.200 cm/s LVOT VTI:    0.237 m  AORTA Ao Root diam: 2.90 cm MITRAL VALVE                TRICUSPID VALVE MV Area (PHT): 4.01 cm     TR Peak grad:   74.3 mmHg MV Decel Time: 189 msec     TR Vmax:        431.00 cm/s MV E velocity: 107.00 cm/s MV A velocity: 45.30 cm/s   SHUNTS MV E/A ratio:  2.36         Systemic VTI:  0.24 m                             Systemic Diam: 2.00 cm Epifanio Lesches MD Electronically signed by Epifanio Lesches MD Signature Date/Time: 03/15/2021/10:07:04 AM    Final    VAS US CAROTID  Result Date: 03/15/2021 Carotid Arterial Duplex Study Patient Name:  FALANA CLAGG Powers  Date of Exam:   03/15/2021 Medical Rec #: [id number]          Accession #:    [id number] Date of Birth: [birth year]          Patient Gender: F Patient Age:   80Y Exam Location:  [facility] Procedure:      VAS US CAROTID Referring Phys: [id number] Marvel Plan --------------------------------------------------------------------------------  Indications:       CVA. Risk Factors:      Hypertension, hyperlipidemia,  prior CVA. Comparison Study:  no prior Performing Technologist: Argentina Ponder RVS  Examination Guidelines: A complete evaluation includes B-mode imaging, spectral Doppler, color Doppler, and power Doppler as needed of all accessible portions of each vessel. Bilateral testing is considered an integral part of a complete examination. Limited examinations for reoccurring indications may be performed as noted.  Right Carotid Findings: +----------+--------+--------+--------+------------------+--------+           PSV cm/sEDV cm/sStenosisPlaque DescriptionComments +----------+--------+--------+--------+------------------+--------+ CCA Prox  37      12              heterogenous                +----------+--------+--------+--------+------------------+--------+ CCA Distal49      17              heterogenous               +----------+--------+--------+--------+------------------+--------+ ICA Prox  58      18      1-39%   heterogenous               +----------+--------+--------+--------+------------------+--------+ ICA Distal71      25                                         +----------+--------+--------+--------+------------------+--------+ ECA       80      12                                         +----------+--------+--------+--------+------------------+--------+ +----------+--------+-------+--------+-------------------+           PSV cm/sEDV cmsDescribeArm Pressure (mmHG) +----------+--------+-------+--------+-------------------+ [id number]                                         +----------+--------+-------+--------+-------------------+ +---------+--------+--+--------+--+---------+ VertebralPSV cm/s44EDV cm/s10Antegrade +---------+--------+--+--------+--+---------+  Left Carotid Findings: +----------+--------+--------+--------+------------------+--------+           PSV cm/sEDV cm/sStenosisPlaque DescriptionComments +----------+--------+--------+--------+------------------+--------+ CCA Prox  49      13              heterogenous               +----------+--------+--------+--------+------------------+--------+ CCA Distal53      15              heterogenous               +----------+--------+--------+--------+------------------+--------+ ICA Prox  102     34      1-39%   heterogenous               +----------+--------+--------+--------+------------------+--------+ ICA Distal93      22                                         +----------+--------+--------+--------+------------------+--------+ ECA       118     17                                          +----------+--------+--------+--------+------------------+--------+ +----------+--------+--------+--------+-------------------+  PSV cm/sEDV cm/sDescribeArm Pressure (mmHG) +----------+--------+--------+--------+-------------------+ [id number]                                         +----------+--------+--------+--------+-------------------+ +---------+--------+--+--------+--+---------+ VertebralPSV cm/s99EDV cm/s29Antegrade +---------+--------+--+--------+--+---------+   Summary: Right Carotid: Velocities in the right ICA are consistent with a 1-39% stenosis. Left Carotid: Velocities in the left ICA are consistent with a 1-39% stenosis. Vertebrals: Bilateral vertebral arteries demonstrate antegrade flow. *See table(s) above for measurements and observations.     Preliminary      PHYSICAL EXAM  Temp:  [97.8 F (36.6 C)-98.7 F (37.1 C)] 97.8 F (36.6 C) (07/28 0817) Pulse Rate:  [68-88] 84 (07/28 0817) Resp:  [18-21] 20 (07/28 0817) BP: (127-220)/(58-98) 127/58 (07/28 0817) SpO2:  [94 %-98 %] 97 % (07/28 0817)  General - Well nourished, well developed, in no apparent distress.  Ophthalmologic - fundi not visualized due to noncooperation.  Cardiovascular - Regular rhythm and rate.  Mental Status -  Level of arousal and orientation to time, place, and person were intact. Language including expression, naming, repetition, comprehension was assessed and found intact. Fund of Knowledge was assessed and was intact.  Cranial Nerves II - XII - II - Visual field intact OU. III, IV, VI - Extraocular movements intact. V - Facial sensation intact bilaterally. VII - mild right nasolabial fold flattening. VIII - Hearing & vestibular intact bilaterally. X - Palate elevates symmetrically. XI - Chin turning & shoulder shrug intact bilaterally. XII - Tongue protrusion intact.  Motor Strength - The patient's strength was normal in left upper and lower extremities  however, right UE proximal 2-/5, 3/5 hand grip, RLE proximal 0/5 and distal 0/5.  Bulk was normal and fasciculations were absent.   Motor Tone - Muscle tone was assessed at the neck and appendages and was normal.  Reflexes - The patient's reflexes were decreased on the right upper and lower extremities and she had no pathological reflexes.  Sensory - Light touch, temperature/pinprick were assessed and were symmetrical.    Coordination - The patient had normal movements in the left hand with no ataxia or dysmetria.  Tremor was absent.  Gait and Station - deferred.    ASSESSMENT/PLAN Jenna Powers is a 80 y.o. female with history of depression, hyperlipidemia, hypertension, neuropathy, and thyroid disease and tremor who lives alone but found on her kitchen floor by family with a bottle full of small green or blue pills scattered around.  She did not receive IV t-PA due to ICH.  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 Very subtle cortical based hyperdense lesion of the medial high right frontal lobe measuring 0.4 cm, as well as a hyperdense subependymal lesion about the posterior right lateral ventricle measuring 0.9 cm.  MRI head W and wo 03/14/21 - 8 mm acute hemorrhage in the deep white matter adjacent to the atrium of the right lateral ventricle. 2.8 cm acute hemorrhage at the left posterior frontal vertex. Mild surrounding edema. 3-4 mm focal hemorrhages noted at the right frontoparietal vertex and at the left posterior frontal vertex just anterior to the larger hemorrhage. The study does not show any finding that would indicate hemorrhagic metastases. 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 CAA MRI too much motion on GRE sequence CT/MRI 3 separate ICHs without abnormal enhancing ? AD diagnosis per pt Concerning for possible CAA  Hypertension Home BP meds: Zestril 40  Current BP meds: Lisinopril 20 daily -> twice daily Labetalol PRN SBP goal < 140 mm Hg given concern for CAA   Hyperlipidemia Home Lipid lowering medication: none LDL 136, goal < 70 Current lipid lowering medication: lipitor 20 Continue low dose statin at discharge  Other Stroke Risk Factors Advanced age Former cigarette smoker - quit Former daily ETOH use Family hx stroke (Father)   Other Active Problems Code status - Full code Leukocytosis - WBC's -  24.2->15.3->13.8 (afebrile)  CKD - stage 3b - creatinine - 1.81->1.65  Depression - on zoloft   Hospital day # 2  Neurology will sign off. Please call with questions. [facility]. Thanks for the consult.   Marvel PlanJindong Passion Lavin, MD PhD Stroke Neurology 03/16/2021 11:32 AM   To contact Stroke Continuity provider, please refer to [email or web address]. After hours, contact General Neurology

## 2021-03-16 NOTE — Progress Notes (Signed)
Called to patient's room.  Patient states she wants to kill herself.  States she was going to try to slit her wrists but that she could not find anything to do it with.  Also stated that she was trying to strangle herself with her purewick tubing.  Notified Dr. David Stall.  Psych consult order placed.  Order received for suicide precautions and Recruitment consultant.  Press photographer and staffing notified.

## 2021-03-16 NOTE — Progress Notes (Signed)
Physical Therapy Treatment Patient Details Name: Jenna Powers MRN: [id number] DOB: [birth year] Today's Date: 03/16/2021    History of Present Illness 80 yo female presented to ED 03/13/21 after being found on the floor with R-sided weakness. Pt is a transfer from [facility]. CT scan of the brain shows an acute intraparenchymal hemorrhage within the medial high right frontal lobe with adjacent edema as well as a left to right midline shift. MRI: 8 mm acute hemorrhage adjacent to R lateral ventricle, 2.8 cm hemorrhage L posterior frontal vertex, 3-4 MM focal hemorhages R frontoparietal and L posterior frontal vertex, L subdural effusion with left-to-right shift of 3 mm.  PMH OA depression HLD HTN neuropathy thyroid disease tremor    PT Comments    Focused session on facilitating core control, sequencing of mobility, and muscle activation of R lower extremity. Pt with min-no muscle activation at R lower extremity to assist with advancement of foot with gait, needing maxAx2 this date. However, pt with improved reactional strategies sitting EOB and activation of trunk musculature to improve upright posture in sitting and standing. Pt progressing to only needing min-modAx2 to power up to stand from sitting. Will continue to follow acutely. Current recommendations remain appropriate.    Follow Up Recommendations  CIR;Supervision/Assistance - 24 hour     Equipment Recommendations  3in1 (PT);Wheelchair (measurements PT);Wheelchair cushion (measurements PT);Hospital bed;Other (comment) (may change with progression)    Recommendations for Other Services       Precautions / Restrictions Precautions Precautions: Fall Precaution Comments: R extensor tone Restrictions Weight Bearing Restrictions: No    Mobility  Bed Mobility Overal bed mobility: Needs Assistance Bed Mobility: Supine to Sit;Sit to Supine     Supine to sit: Mod assist;+2 for physical assistance;+2 for safety/equipment;HOB  elevated Sit to supine: Mod assist;+2 for physical assistance;+2 for safety/equipment   General bed mobility comments: ModAx2 to manage legs (primarily the R) and initiate trunk ascent to sit up, with pt displaying attempt to push up from bed with UE. Pt initiating return to supine through leaning laterally onto L elbow, not initiating lifting of legs originally but did do so with L leg once cued.    Transfers Overall transfer level: Needs assistance Equipment used: 2 person hand held assist Transfers: Sit to/from [employer] Sit to Stand: +2 physical assistance;+2 safety/equipment;Min assist;Mod assist Stand pivot transfers: Max assist;+2 physical assistance;+2 safety/equipment       General transfer comment: Sit to stand 1x from EOB and 1x from commode with stand step transfers bed <> commode. Min-modAx2 to power up to stand, cuing pt to extend hips and look superiorly to improve posture, good initiation noted by pt. Intermittent R knee block/close guarding but no appreciative knee buckling noted, bil HHA, and maxAx2 provided to improve upright posture, sequence weight shifting, lift R leg to advance it, and direct buttocks to/from surfaces.  Ambulation/Gait Ambulation/Gait assistance: Max assist;+2 physical assistance;+2 safety/equipment Gait Distance (Feet): 2 Feet (x2 bouts of ~2 ft each) Assistive device: 2 person hand held assist Gait Pattern/deviations: Step-to pattern;Decreased step length - right;Decreased stride length;Decreased weight shift to right;Decreased weight shift to left;Decreased dorsiflexion - right;Trunk flexed Gait velocity: reduced Gait velocity interpretation: <1.31 ft/sec, indicative of household ambulator General Gait Details: MaxAx2 to sequence weight shifting, advance L leg, improve posture, and direct pt with lateral steps each direction this date with bil HHA. Pt with improved correcting of posture, but no active assist to move R leg. Attempted  3rd bout of anterior <>  posterior stepping with r foot but pt quickly fatigued and decided to sit.   Stairs             Wheelchair Mobility    Modified Rankin (Stroke Patients Only) Modified Rankin (Stroke Patients Only) Pre-Morbid Rankin Score: No significant disability Modified Rankin: Moderately severe disability     Balance Overall balance assessment: Needs assistance Sitting-balance support: Single extremity supported;Feet supported Sitting balance-Leahy Scale: Poor Sitting balance - Comments: Reliant on L UE support and min guard- minA to prevent LOB sitting statically. Visual and tactile cues provided to match body with PT's anterior to her and become aware of direction she leans to correct it.   Standing balance support: Bilateral upper extremity supported;During functional activity Standing balance-Leahy Scale: Poor Standing balance comment: Reliant on bil UE support and min-modAx2 to stand statically. maxAx2 for standing mobility.                            Cognition Arousal/Alertness: Awake/alert Behavior During Therapy: Anxious;Impulsive;Flat affect Overall Cognitive Status: Impaired/Different from baseline Area of Impairment: Attention;Memory;Following commands;Safety/judgement;Awareness;Problem solving                   Current Attention Level: Sustained Memory: Decreased short-term memory Following Commands: Follows one step commands with increased time;Follows one step commands inconsistently Safety/Judgement: Decreased awareness of safety;Decreased awareness of deficits Awareness: Intellectual Problem Solving: Slow processing;Decreased initiation;Difficulty sequencing;Requires verbal cues;Requires tactile cues General Comments: Pt anxious and appeared to be depressed with comment in regards to her wishing for her own death, notified RN and MD for possible psych consult. Flat affect throughput, repeating "I can't" when cued to try to move  her R side. Encouraged pt to instead say "I will try". Pt with poor awareness into her condition, asking if there was a pill to fix her. Needed repeated education on importance of mobility to make improvements.      Exercises General Exercises - Lower Extremity Heel Slides: AAROM;Right;5 reps;Supine (min-no activation noted by pt) Straight Leg Raises: AAROM;Right;5 reps;Supine (min-no activation noted)    General Comments General comments (skin integrity, edema, etc.): Improved breaking of extensor tone in R leg this date, easily flexing with use of gravity      Pertinent Vitals/Pain Pain Assessment: Faces Faces Pain Scale: Hurts little more Pain Location: lower back Pain Descriptors / Indicators: Discomfort;Grimacing;Guarding Pain Intervention(s): Monitored during session;Limited activity within patient's tolerance;Repositioned    Home Living                      Prior Function            PT Goals (current goals can now be found in the care plan section) Acute Rehab PT Goals Patient Stated Goal: to go to CIR PT Goal Formulation: With patient Time For Goal Achievement: 03/29/21 Potential to Achieve Goals: Fair Progress towards PT goals: Progressing toward goals    Frequency    Min 4X/week      PT Plan Current plan remains appropriate    Co-evaluation              AM-PAC PT "6 Clicks" Mobility   Outcome Measure  Help needed turning from your back to your side while in a flat bed without using bedrails?: A Lot Help needed moving from lying on your back to sitting on the side of a flat bed without using bedrails?: Total Help needed moving to and from a bed  to a chair (including a wheelchair)?: Total Help needed standing up from a chair using your arms (e.g., wheelchair or bedside chair)?: Total Help needed to walk in hospital room?: Total Help needed climbing 3-5 steps with a railing? : Total 6 Click Score: 7    End of Session Equipment Utilized  During Treatment: Gait belt Activity Tolerance: Patient limited by fatigue Patient left: in bed;with call bell/phone within reach;with bed alarm set;Other (comment) (in semi-chair like position in bed) Nurse Communication: Mobility status;Other (comment) (possible depression; bleeding noted at small spot posterior aspect of R thigh) PT Visit Diagnosis: Unsteadiness on feet (R26.81);Other abnormalities of gait and mobility (R26.89);Muscle weakness (generalized) (M62.81);Difficulty in walking, not elsewhere classified (R26.2);Apraxia (R48.2);Other symptoms and signs involving the nervous system (R29.898);Hemiplegia and hemiparesis Hemiplegia - Right/Left: Right Hemiplegia - dominant/non-dominant: Dominant Hemiplegia - caused by: Nontraumatic intracerebral hemorrhage     Time: 0928-0957 PT Time Calculation (min) (ACUTE ONLY): 29 min  Charges:  $Therapeutic Activity: 8-22 mins $Neuromuscular Re-education: 8-22 mins                     Raymond Gurney, PT, DPT Acute Rehabilitation Services  Pager: [phone] Office: [phone]    Jewel Baize 03/16/2021, 2:27 PM

## 2021-03-16 NOTE — Progress Notes (Signed)
[facility] PROGRESS NOTE    Progress Note  Jenna Powers  FGH:[id number] DOB: [birth year] DOA: 03/14/2021 PCP: Dortha Kern, MD     Brief Narrative:   Jenna Powers is an 80 y.o. female past medical history significant for depression, history of CVA essential hypertension brought in to the ED after EMS found her on the floor on the right side covered in urine and feces CT of the head without contrast shows small bleed in the right frontal and periventricular area suspicious for metastatic process question traumatic.  Neurology was consulted.    Assessment/Plan:   Intraparenchymal hemorrhage of brain: Admitted to the ICU. MRI of the brain showed 8 mm acute hemorrhage and a 2.8 acute hemorrhage of the left posterior frontal vertex with mild surrounding edema. No events on cardiac telemetry. 2D echo showed an EF of 60% with grade 3 diastolic heart failure IV labetalol as needed as needed LDL 136, she was started on low-dose statin. Further management per the stroke team.  On no further antithrombotic therapy. MRA of the brain showed no large occlusions. Carotid Doppler pending Physical therapy evaluated the patient recommended CIR.  Rhabdomyolysis nontraumatic: Less Than 5000 she was started on IV fluids now 800. Continue IV fluids.  Acute kidney injury: Baseline creatinine around 1 on admission 1.8 in the setting of ARB use rhabdomyolysis and decreased oral intake. She was started on IV fluids this morning her creatinine is 1.6.  Leukocytosis: Likely due to intracranial bleed, slowly trending down. Has remained afebrile continue to monitor off antibiotics.  Elevated troponins: Denies any chest pain likely due to intracranial bleed.   Stage I  ulcer present on admission on the right buttock RN Pressure Injury Documentation: Pressure Injury 03/14/21 Buttocks Right Deep Tissue Pressure Injury - Purple or maroon localized area of discolored intact skin or  blood-filled blister due to damage of underlying soft tissue from pressure and/or shear. (Active)  03/14/21 1500  Location: Buttocks  Location Orientation: Right  Staging: Deep Tissue Pressure Injury - Purple or maroon localized area of discolored intact skin or blood-filled blister due to damage of underlying soft tissue from pressure and/or shear.  Wound Description (Comments):   Present on Admission: Yes    Estimated body mass index is 20.18 kg/m as calculated from the following:   Height as of 03/13/21: 5\' 6"  (1.676 m).   Weight as of 03/13/21: 56.7 kg.   DVT prophylaxis: none Family Communication:none Status is: Inpatient  Remains inpatient appropriate because:Hemodynamically unstable  Dispo: The patient is from: Home              Anticipated d/c is to: SNF              Patient currently is not medically stable to d/c.   Difficult to place patient No   Code Status:     Code Status Orders  (From admission, onward)           Start     Ordered   03/14/21 1656  Full code  Continuous        03/14/21 1656           Code Status History     Date Active Date Inactive Code Status Order ID Comments User Context   03/14/2021 0926 03/14/2021 1420 Full Code 03/16/2021  [id number], MD ED   03/14/2021 0639 03/14/2021 0926 Full Code 03/16/2021  [id number], MD ED   09/10/2019 1450 09/11/2019 2140 Full Code 09/13/2019  [id number],  Vipul, MD ED         IV Access:   Peripheral IV   Procedures and diagnostic studies:   MR ANGIO HEAD WO CONTRAST  Result Date: 03/15/2021 CLINICAL DATA:  Cerebral hemorrhage. EXAM: MRA HEAD WITHOUT CONTRAST TECHNIQUE: Angiographic images of the Circle of Willis were acquired using MRA technique without intravenous contrast. COMPARISON:  MRI 03/14/2021. FINDINGS: Anterior circulation: Bilateral intracranial ICAs, MCAs, and ACAs are patent without proximal flow limiting stenosis. Approximately 2-3 mm conical outpouching arising from the  posterolateral right cavernous ICA (series 407, image 25; series 4, image 55 with suggestion of possible small vessel arising from the tip, favoring an infundibulum over aneurysm. No clear evidence of a vascular malformation in the region of the right periventricular hemorrhage, although acute blood products limits evaluation. Please note that the hemorrhages at the vertex are not imaged on this MRA. Posterior circulation: Left dominant intradural vertebral artery. Bilateral intradural vertebral arteries, basilar artery, and posterior cerebral arteries are patent without proximal hemodynamically significant stenosis. No aneurysm identified. IMPRESSION: 1. No large vessel occlusion or proximal hemodynamically significant stenosis. 2. No clear evidence of a vascular malformation in the region of the right periventricular hemorrhage, although acute blood products limits evaluation. Please note that the hemorrhages at the vertex are not imaged on this MRA. 3. Approximately 2-3 mm conical outpouching arising from the posterolateral right distal cavernous ICA with suggestion of a small vessel arising from the tip, favoring infundibulum over aneurysm. A CTA could confirm if clinically indicated. Electronically Signed   By: Feliberto Harts MD   On: 03/15/2021 11:31   MR Brain W and Wo Contrast  Result Date: 03/14/2021 CLINICAL DATA:  Head trauma, moderate to severe. Found on the floor. Right-sided weakness. EXAM: MRI HEAD WITHOUT AND WITH CONTRAST TECHNIQUE: Multiplanar, multiecho pulse sequences of the brain and surrounding structures were obtained without and with intravenous contrast. CONTRAST:  6mL GADAVIST GADOBUTROL 1 MMOL/ML IV SOLN COMPARISON:  Head CT yesterday.  MRI 09/10/2019. FINDINGS: Brain: The study suffers from considerable motion degradation. Diffusion imaging does not show any acute ischemic infarction. No focal abnormality is seen affecting the brainstem or cerebellum. There is an intraparenchymal  hemorrhage in the deep white matter adjacent to the atrium of the right lateral ventricle as seen by CT, measuring approximately 8 mm in size. Second intraparenchymal hemorrhage at left posterior frontal vertex with maximal dimension of 2.8 cm. Both of these have not changed since the CT scan yesterday. Mild surrounding edema. 3-4 mm punctate hemorrhage is also noted at the right frontoparietal vertex and at the left posterior frontal region just anterior to the larger hemorrhage. These are actually more visible by CT. Brain otherwise appears normal for age without evidence of age accelerated atrophy or anything more than minimal small vessel change of the deep white [place] there is a small subdural effusion on the left, maximal thickness 6 mm. No acute blood products. Minimal mass effect with left-to-right shift 3 mm. After contrast administration, no abnormal enhancement is seen at the site of either hemorrhage or elsewhere. No hydrocephalus. Vascular: Major vessels at the base of the brain show flow. Skull and upper cervical spine: Negative Sinuses/Orbits: Clear except for mucosal thickening and fluid in the right maxillary sinus. Orbits negative. Other: None IMPRESSION: 8 mm acute hemorrhage in the deep white matter adjacent to the atrium of the right lateral ventricle. 2.8 cm acute hemorrhage at the left posterior frontal vertex. Mild surrounding edema. 3-4 mm focal hemorrhages noted at  the right frontoparietal vertex and at the left posterior frontal vertex just anterior to the larger hemorrhage. These were more apparent at CT. No evidence of old hemorrhage. Minimal age related volume loss with very minimal small vessel change of the white matter. Left subdural effusion, maximal thickness 6 mm, with left-to-right shift of 3 mm. No abnormal contrast enhancement occurs in these locations or elsewhere. The study does not show any finding that would indicate hemorrhagic metastases. Therefore, these are felt to be  synchronous intraparenchymal hemorrhages Electronically Signed   By: Paulina Fusi M.D.   On: 03/14/2021 10:32   ECHOCARDIOGRAM COMPLETE  Result Date: 03/15/2021    ECHOCARDIOGRAM REPORT   Patient Name:   Jenna Powers Date of Exam: 03/15/2021 Medical Rec #:  [id number]         Height:       66.0 in Accession #:    [id number]        Weight:       125.0 lb Date of Birth:  Mar 17, 1941         BSA:          1.638 m Patient Age:    80 years          BP:           138/66 mmHg Patient Gender: F                 HR:           66 bpm. Exam Location:  Inpatient Procedure: 2D Echo, Cardiac Doppler and Color Doppler Indications:    CVA  History:        Patient has prior history of Echocardiogram examinations, most                 recent 09/10/2019. Stroke, Signs/Symptoms:CKD; Risk                 Factors:Hypertension, Dyslipidemia and Current Smoker.  Sonographer:    Lavenia Atlas Referring Phys: [id number] ALLISON WOLFE IMPRESSIONS  1. Left ventricular ejection fraction, by estimation, is 60 to 65%. The left ventricle has normal function. The left ventricle has no regional wall motion abnormalities. There is mild left ventricular hypertrophy. Left ventricular diastolic parameters are consistent with Grade III diastolic dysfunction (restrictive). Elevated left atrial pressure.  2. Right ventricular systolic function is normal. The right ventricular size is normal. There is severely elevated pulmonary artery systolic pressure. The estimated right ventricular systolic pressure is 77.3 mmHg.  3. Left atrial size was mildly dilated.  4. The mitral valve is normal in structure. Trivial mitral valve regurgitation. No evidence of mitral stenosis.  5. Tricuspid valve regurgitation is moderate.  6. The aortic valve is tricuspid. Aortic valve regurgitation is not visualized. No aortic stenosis is present.  7. The inferior vena cava is normal in size with greater than 50% respiratory variability, suggesting right atrial pressure of 3  mmHg. FINDINGS  Left Ventricle: Left ventricular ejection fraction, by estimation, is 60 to 65%. The left ventricle has normal function. The left ventricle has no regional wall motion abnormalities. The left ventricular internal cavity size was normal in size. There is  mild left ventricular hypertrophy. Left ventricular diastolic parameters are consistent with Grade III diastolic dysfunction (restrictive). Elevated left atrial pressure. Right Ventricle: The right ventricular size is normal. No increase in right ventricular wall thickness. Right ventricular systolic function is normal. There is severely elevated pulmonary artery systolic pressure. The tricuspid regurgitant velocity is 4.31 m/s,  and with an assumed right atrial pressure of 3 mmHg, the estimated right ventricular systolic pressure is 77.3 mmHg. Left Atrium: Left atrial size was mildly dilated. Right Atrium: Right atrial size was normal in size. Pericardium: Trivial pericardial effusion is present. Mitral Valve: The mitral valve is normal in structure. Trivial mitral valve regurgitation. No evidence of mitral valve stenosis. Tricuspid Valve: The tricuspid valve is normal in structure. Tricuspid valve regurgitation is moderate. Aortic Valve: The aortic valve is tricuspid. Aortic valve regurgitation is not visualized. No aortic stenosis is present. Pulmonic Valve: The pulmonic valve was not well visualized. Pulmonic valve regurgitation is not visualized. Aorta: The aortic root and ascending aorta are structurally normal, with no evidence of dilitation. Venous: The inferior vena cava is normal in size with greater than 50% respiratory variability, suggesting right atrial pressure of 3 mmHg. IAS/Shunts: The interatrial septum was not well visualized.  LEFT VENTRICLE PLAX 2D LVIDd:         3.90 cm  Diastology LVIDs:         2.60 cm  LV e' medial:    5.66 cm/s LV PW:         1.10 cm  LV E/e' medial:  18.9 LV IVS:        1.30 cm  LV e' lateral:   9.14 cm/s  LVOT diam:     2.00 cm  LV E/e' lateral: 11.7 LV SV:         74 LV SV Index:   45 LVOT Area:     3.14 cm  RIGHT VENTRICLE RV Basal diam:  3.20 cm RV S prime:     12.30 cm/s TAPSE (M-mode): 2.1 cm LEFT ATRIUM             Index       RIGHT ATRIUM           Index LA diam:        2.40 cm 1.47 cm/m  RA Area:     14.80 cm LA Vol (A2C):   53.9 ml 32.91 ml/m RA Volume:   35.70 ml  21.80 ml/m LA Vol (A4C):   56.2 ml 34.32 ml/m LA Biplane Vol: 58.7 ml 35.84 ml/m  AORTIC VALVE LVOT Vmax:   115.00 cm/s LVOT Vmean:  65.200 cm/s LVOT VTI:    0.237 m  AORTA Ao Root diam: 2.90 cm MITRAL VALVE                TRICUSPID VALVE MV Area (PHT): 4.01 cm     TR Peak grad:   74.3 mmHg MV Decel Time: 189 msec     TR Vmax:        431.00 cm/s MV E velocity: 107.00 cm/s MV A velocity: 45.30 cm/s   SHUNTS MV E/A ratio:  2.36         Systemic VTI:  0.24 m                             Systemic Diam: 2.00 cm Epifanio Lesches MD Electronically signed by Epifanio Lesches MD Signature Date/Time: 03/15/2021/10:07:04 AM    Final    VAS US CAROTID  Result Date: 03/15/2021 Carotid Arterial Duplex Study Patient Name:  Jenna Powers  Date of Exam:   03/15/2021 Medical Rec #: [id number]          Accession #:    [id number] Date of Birth: [birth year]  Patient Gender: F Patient Age:   8Y Exam Location:  [facility] Procedure:      VAS US CAROTID Referring Phys: [id number] Marvel Plan --------------------------------------------------------------------------------  Indications:       CVA. Risk Factors:      Hypertension, hyperlipidemia, prior CVA. Comparison Study:  no prior Performing Technologist: Argentina Ponder RVS  Examination Guidelines: A complete evaluation includes B-mode imaging, spectral Doppler, color Doppler, and power Doppler as needed of all accessible portions of each vessel. Bilateral testing is considered an integral part of a complete examination. Limited examinations for reoccurring indications may be performed  as noted.  Right Carotid Findings: +----------+--------+--------+--------+------------------+--------+           PSV cm/sEDV cm/sStenosisPlaque DescriptionComments +----------+--------+--------+--------+------------------+--------+ CCA Prox  37      12              heterogenous               +----------+--------+--------+--------+------------------+--------+ CCA Distal49      17              heterogenous               +----------+--------+--------+--------+------------------+--------+ ICA Prox  58      18      1-39%   heterogenous               +----------+--------+--------+--------+------------------+--------+ ICA Distal71      25                                         +----------+--------+--------+--------+------------------+--------+ ECA       80      12                                         +----------+--------+--------+--------+------------------+--------+ +----------+--------+-------+--------+-------------------+           PSV cm/sEDV cmsDescribeArm Pressure (mmHG) +----------+--------+-------+--------+-------------------+ [id number]                                         +----------+--------+-------+--------+-------------------+ +---------+--------+--+--------+--+---------+ VertebralPSV cm/s44EDV cm/s10Antegrade +---------+--------+--+--------+--+---------+  Left Carotid Findings: +----------+--------+--------+--------+------------------+--------+           PSV cm/sEDV cm/sStenosisPlaque DescriptionComments +----------+--------+--------+--------+------------------+--------+ CCA Prox  49      13              heterogenous               +----------+--------+--------+--------+------------------+--------+ CCA Distal53      15              heterogenous               +----------+--------+--------+--------+------------------+--------+ ICA Prox  102     34      1-39%   heterogenous                +----------+--------+--------+--------+------------------+--------+ ICA Distal93      22                                         +----------+--------+--------+--------+------------------+--------+ ECA       118     17                                         +----------+--------+--------+--------+------------------+--------+ +----------+--------+--------+--------+-------------------+  PSV cm/sEDV cm/sDescribeArm Pressure (mmHG) +----------+--------+--------+--------+-------------------+ [id number]                                         +----------+--------+--------+--------+-------------------+ +---------+--------+--+--------+--+---------+ VertebralPSV cm/s99EDV cm/s29Antegrade +---------+--------+--+--------+--+---------+   Summary: Right Carotid: Velocities in the right ICA are consistent with a 1-39% stenosis. Left Carotid: Velocities in the left ICA are consistent with a 1-39% stenosis. Vertebrals: Bilateral vertebral arteries demonstrate antegrade flow. *See table(s) above for measurements and observations.     Preliminary      Medical Consultants:   None.   Subjective:    Jenna DecampGloria Van Powers headaches improved.  Objective:    Vitals:   03/15/21 2243 03/15/21 2246 03/16/21 0329 03/16/21 0500  BP: (!) 209/85 (!) 203/88 (!) 220/98 131/76  Pulse: 68 69 88   Resp: 18 18 20    Temp:  98.4 F (36.9 C) 98 F (36.7 C)   TempSrc:  Oral Oral   SpO2: 94% 95%     SpO2: 95 %   Intake/Output Summary (Last 24 hours) at 03/16/2021 0804 Last data filed at 03/16/2021 0541 Gross per 24 hour  Intake --  Output 1100 ml  Net -1100 ml    There were no vitals filed for this visit.  Exam: General exam: In no acute distress. Respiratory system: Good air movement and clear to auscultation. Cardiovascular system: S1 & S2 heard, RRR. No JVD. Gastrointestinal system: Abdomen is nondistended, soft and nontender.  Extremities: No pedal edema. Skin: No  rashes, lesions or ulcers  Data Reviewed:    Labs: Basic Metabolic Panel: Recent Labs  Lab 03/13/21 1806 03/14/21 0401  NA 134* 139  K 3.6 3.6  CL 97* 103  CO2 27 27  GLUCOSE 127* 112*  BUN 38* 43*  CREATININE 1.81* 1.65*  CALCIUM 9.3 8.7*  MG 1.9  --     GFR Estimated Creatinine Clearance: 23.5 mL/min (A) (by C-G formula based on SCr of 1.65 mg/dL (H)). Liver Function Tests: Recent Labs  Lab 03/13/21 1806  AST 73*  ALT 36  ALKPHOS 87  BILITOT 1.3*  PROT 7.2  ALBUMIN 4.0    No results for input(s): LIPASE, AMYLASE in the last 168 hours. No results for input(s): AMMONIA in the last 168 hours. Coagulation profile Recent Labs  Lab 03/13/21 1806 03/14/21 1021  INR 1.1 1.1    COVID-19 Labs  No results for input(s): DDIMER, FERRITIN, LDH, CRP in the last 72 hours.  Lab Results  Component Value Date   SARSCOV2NAA NEGATIVE 03/13/2021   SARSCOV2NAA NEGATIVE 09/10/2019    CBC: Recent Labs  Lab 03/13/21 1806 03/14/21 0401 03/14/21 1021  WBC 24.2* 15.3* 13.8*  NEUTROABS 20.1* 11.4*  --   HGB 12.5 10.6* 11.2*  HCT 36.8 31.5* 32.6*  MCV 93.6 95.2 95.6  PLT 428* 296 316    Cardiac Enzymes: Recent Labs  Lab 03/13/21 1806 03/14/21 0401  CKTOTAL 1,390* 817*    BNP (last 3 results) No results for input(s): PROBNP in the last 8760 hours. CBG: Recent Labs  Lab 03/14/21 1310  GLUCAP 102*    D-Dimer: No results for input(s): DDIMER in the last 72 hours. Hgb A1c: Recent Labs    03/14/21 1644  HGBA1C 5.7*    Lipid Profile: Recent Labs    03/14/21 1644  CHOL 225*  HDL 45  LDLCALC 136*  TRIG 221*  CHOLHDL 5.0    Thyroid  function studies: No results for input(s): TSH, T4TOTAL, T3FREE, THYROIDAB in the last 72 hours.  Invalid input(s): FREET3 Anemia work up: No results for input(s): VITAMINB12, FOLATE, FERRITIN, TIBC, IRON, RETICCTPCT in the last 72 hours. Sepsis Labs: Recent Labs  Lab 03/13/21 1806 03/14/21 0401 03/14/21 1021   WBC 24.2* 15.3* 13.8*    Microbiology Recent Results (from the past 240 hour(s))  Resp Panel by RT-PCR (Flu A&B, Covid) Nasopharyngeal Swab     Status: None   Collection Time: 03/13/21  6:06 PM   Specimen: Nasopharyngeal Swab; Nasopharyngeal(NP) swabs in vial transport medium  Result Value Ref Range Status   SARS Coronavirus 2 by RT PCR NEGATIVE NEGATIVE Final    Comment: (NOTE) SARS-CoV-2 target nucleic acids are NOT DETECTED.  The SARS-CoV-2 RNA is generally detectable in upper respiratory specimens during the acute phase of infection. The lowest concentration of SARS-CoV-2 viral copies this assay can detect is 138 copies/mL. A negative result does not preclude SARS-Cov-2 infection and should not be used as the sole basis for treatment or other patient management decisions. A negative result may occur with  improper specimen collection/handling, submission of specimen other than nasopharyngeal swab, presence of viral mutation(s) within the areas targeted by this assay, and inadequate number of viral copies(<138 copies/mL). A negative result must be combined with clinical observations, patient history, and epidemiological information. The expected result is Negative.  Fact Sheet for Patients:  BloggerCourse.com  Fact Sheet for Healthcare Providers:  SeriousBroker.it  This test is no t yet approved or cleared by the [place] FDA and  has been authorized for detection and/or diagnosis of SARS-CoV-2 by FDA under an Emergency Use Authorization (EUA). This EUA will remain  in effect (meaning this test can be used) for the duration of the COVID-19 declaration under Section 564(b)(1) of the Act, 21 U.S.C.section 360bbb-3(b)(1), unless the authorization is terminated  or revoked sooner.       Influenza A by PCR NEGATIVE NEGATIVE Final   Influenza B by PCR NEGATIVE NEGATIVE Final    Comment: (NOTE) The Xpert Xpress  SARS-CoV-2/FLU/RSV plus assay is intended as an aid in the diagnosis of influenza from Nasopharyngeal swab specimens and should not be used as a sole basis for treatment. Nasal washings and aspirates are unacceptable for Xpert Xpress SARS-CoV-2/FLU/RSV testing.  Fact Sheet for Patients: BloggerCourse.com  Fact Sheet for Healthcare Providers: SeriousBroker.it  This test is not yet approved or cleared by the [place] FDA and has been authorized for detection and/or diagnosis of SARS-CoV-2 by FDA under an Emergency Use Authorization (EUA). This EUA will remain in effect (meaning this test can be used) for the duration of the COVID-19 declaration under Section 564(b)(1) of the Act, 21 U.S.C. section 360bbb-3(b)(1), unless the authorization is terminated or revoked.  Performed at [facility], [address] [place], [place] [address]      Medications:    heparin injection (subcutaneous)  5,000 Units Subcutaneous Q8H   levothyroxine  25 mcg Oral q morning   lisinopril  20 mg Oral BID   melatonin  3 mg Oral QHS   sertraline  100 mg Oral Daily   Continuous Infusions:  sodium chloride 75 mL/hr at 03/15/21 1148      LOS: 2 days   Marinda Elk  [facility]  03/16/2021, 8:04 AM

## 2021-03-16 NOTE — Progress Notes (Signed)
Suicide sitter initiated.  All hazards removed from room per policy.  Patient updated on plan, verbalizes understanding.

## 2021-03-17 DIAGNOSIS — R45851 Suicidal ideations: Secondary | ICD-10-CM

## 2021-03-17 LAB — BASIC METABOLIC PANEL
Anion gap: 10 (ref 5–15)
BUN: 16 mg/dL (ref 8–23)
CO2: 27 mmol/L (ref 22–32)
Calcium: 9.1 mg/dL (ref 8.9–10.3)
Chloride: 98 mmol/L (ref 98–111)
Creatinine, Ser: 1.11 mg/dL — ABNORMAL HIGH (ref 0.44–1.00)
GFR, Estimated: 50 mL/min — ABNORMAL LOW (ref >60)
Glucose, Bld: 124 mg/dL — ABNORMAL HIGH (ref 70–99)
Potassium: 3.2 mmol/L — ABNORMAL LOW (ref 3.5–5.1)
Sodium: 135 mmol/L (ref 135–145)

## 2021-03-17 NOTE — Care Management Important Message (Signed)
Important Message  Patient Details  Name: Jenna Powers MRN: [id number] Date of Birth: May 01, 1941   Medicare Important Message Given:  Yes     Mardene Sayer 03/17/2021, 5:13 PM

## 2021-03-17 NOTE — Progress Notes (Signed)
Occupational Therapy Treatment Patient Details Name: Jenna Powers MRN: [id number] DOB: 01-28-1941 Today's Date: 03/17/2021    History of present illness 80 yo female presented to ED 03/13/21 after being found on the floor with R-sided weakness. Pt is a transfer from [facility]. CT scan of the brain shows an acute intraparenchymal hemorrhage within the medial high right frontal lobe with adjacent edema as well as a left to right midline shift. MRI: 8 mm acute hemorrhage adjacent to R lateral ventricle, 2.8 cm hemorrhage L posterior frontal vertex, 3-4 MM focal hemorhages R frontoparietal and L posterior frontal vertex, L subdural effusion with left-to-right shift of 3 mm.  PMH OA depression HLD HTN neuropathy thyroid disease tremor   OT comments  Pt requesting the OA medications that she takes be provided and MD made aware. Pt states "I could do better if I had that home medication" Pt requires encouragement initially but activating more with favorite music played "glenn miller" pt smiling during session and thanking staff for visit. Pt +2 MAx for sit <>stand due to (A) to elongate trunk and block L LE. REcommenations updated to SNF per CIR sign off.    Follow Up Recommendations  SNF    Equipment Recommendations  3 in 1 bedside commode;Wheelchair (measurements OT);Wheelchair cushion (measurements OT)    Recommendations for Other Services      Precautions / Restrictions Precautions Precautions: Fall       Mobility Bed Mobility Overal bed mobility: Needs Assistance Bed Mobility: Supine to Sit;Sit to Supine     Supine to sit: +2 for physical assistance;+2 for safety/equipment;HOB elevated;Max assist Sit to supine: +2 for physical assistance;+2 for safety/equipment;Max assist   General bed mobility comments: ModAx2 to manage legs (primarily the R) and initiate trunk ascent to sit up, . Pt initiating return to supine through leaning laterally onto L elbow, not initiating lifting of legs     Transfers Overall transfer level: Needs assistance Equipment used: 2 person hand held assist Transfers: Sit to/from Stand Sit to Stand: +2 physical assistance;+2 safety/equipment;Max assist         General transfer comment: blocking of R LE completely with hyper extension noted. pt static standing with (A) to elevate trunk, cues for neck extension, pt with no scapula retraction noted. pt reports standing does help with buttock discomfort    Balance Overall balance assessment: Needs assistance Sitting-balance support: Single extremity supported;Feet supported Sitting balance-Leahy Scale: Poor Sitting balance - Comments: flexed forward posture and reliant on therapist for static sitting   Standing balance support: Bilateral upper extremity supported;During functional activity Standing balance-Leahy Scale: Poor Standing balance comment: Pt requires (A) to support R UE no activation noted                           ADL either performed or assessed with clinical judgement   ADL Overall ADL's : Needs assistance/impaired     Grooming: Maximal assistance;Bed level Grooming Details (indicate cue type and reason): hand over hand to don chap stick                               General ADL Comments: pt requires total (A) fo rLB dressing / bathing. pt needs (A) to sequence be d mob and position R UE     Vision       Perception     Praxis      Cognition  Arousal/Alertness: Awake/alert Behavior During Therapy: Flat affect Overall Cognitive Status: No family/caregiver present to determine baseline cognitive functioning                                 General Comments: pt reports "i can't" pt smiling at times during session. pt recalls sitters name as Harriett Sine. pt recalling seeing PT previous session but thought it was OT. ( to be fair same color uniform close in height and both with same color hair)        Exercises Exercises: Other  exercises Other Exercises Other Exercises: attempted activation of UE with lateral lean, bil UE use and  reaching. Other Exercises: attempted activation of knee extension bil and only firing L LE   Shoulder Instructions       General Comments noted to have bruising on buttock. music played "Airline pilot"    Pertinent Vitals/ Pain       Pain Assessment: Faces Faces Pain Scale: Hurts little more Pain Location: buttock Pain Descriptors / Indicators: Discomfort;Grimacing;Guarding Pain Intervention(s): Monitored during session;Repositioned  Home Living                                          Prior Functioning/Environment              Frequency  Min 2X/week        Progress Toward Goals  OT Goals(current goals can now be found in the care plan section)  Progress towards OT goals: Progressing toward goals  Acute Rehab OT Goals Patient Stated Goal: to be able to walk again dance OT Goal Formulation: With patient Time For Goal Achievement: 03/29/21 Potential to Achieve Goals: Good ADL Goals Pt Will Perform Grooming: with set-up;sitting Pt Will Transfer to Toilet: with min assist;bedside commode Pt/caregiver will Perform Home Exercise Program: Increased strength;With minimal assist;Right Upper extremity;With written HEP provided Additional ADL Goal #1: Pt will attend to tasks on R side 75 % of the time with minimal cues. Additional ADL Goal #2: pt will state and use 3 ADL items for intended purpose in 3/4 trials with minimal cues fro proper sequencing.  Plan Discharge plan needs to be updated    Co-evaluation    PT/OT/SLP Co-Evaluation/Treatment: Yes Reason for Co-Treatment: Complexity of the patient's impairments (multi-system involvement);Necessary to address cognition/behavior during functional activity;For patient/therapist safety;To address functional/ADL transfers   OT goals addressed during session: ADL's and self-care;Proper use of Adaptive  equipment and DME      AM-PAC OT "6 Clicks" Daily Activity     Outcome Measure   Help from another person eating meals?: A Lot Help from another person taking care of personal grooming?: A Lot Help from another person toileting, which includes using toliet, bedpan, or urinal?: Total Help from another person bathing (including washing, rinsing, drying)?: A Lot Help from another person to put on and taking off regular upper body clothing?: A Lot Help from another person to put on and taking off regular lower body clothing?: A Lot 6 Click Score: 11    End of Session Equipment Utilized During Treatment: Gait belt  OT Visit Diagnosis: Unsteadiness on feet (R26.81);Muscle weakness (generalized) (M62.81);Other symptoms and signs involving cognitive function;Pain   Activity Tolerance Patient tolerated treatment well   Patient Left in bed;with call bell/phone within reach;with bed alarm set   Nurse  Communication Mobility status        Time: 0802-2336 OT Time Calculation (min): 19 min  Charges: OT General Charges $OT Visit: 1 Visit OT Treatments $Self Care/Home Management : 8-22 mins   Brynn, OTR/L  Acute Rehabilitation Services Pager: [phone] Office: [phone] .    Mateo Flow 03/17/2021, 2:57 PM

## 2021-03-17 NOTE — Progress Notes (Signed)
[facility] PROGRESS NOTE    Progress Note  Jenna Powers  ZHY:[id number] DOB: 08/15/1941 DOA: 03/14/2021 PCP: Dortha KernBliss, Laura K, MD     Brief Narrative:   Jenna Powers is an 80 y.o. female past medical history significant for depression, history of CVA essential hypertension brought in to the ED after EMS found her on the floor on the right side covered in urine and feces CT of the head without contrast shows small bleed in the right frontal and periventricular area suspicious for metastatic process question traumatic.  Neurology was consulted.    Assessment/Plan:   Intraparenchymal hemorrhage of brain: Admitted to the ICU. MRI of the brain showed 8 mm acute hemorrhage and a 2.8 acute hemorrhage of the left posterior frontal vertex with mild surrounding edema. Carotid Dopplers unremarkable. 2D echo showed an EF of 60% with grade 3 diastolic heart failure IV labetalol as needed as needed LDL 136, she was started on low-dose statin. Further management per the stroke team.  On no further antithrombotic therapy. MRA of the brain showed no large occlusions. She does not qualify for CIR, TOC to start working skilled nursing facility placement.  Rhabdomyolysis nontraumatic: Resolved with IV fluid hydration , KVO IV fluids.  Acute kidney injury: Baseline creatinine around 1 on admission 1.8 in the setting of ARB use rhabdomyolysis and decreased oral intake. Basic metabolic panels pending this morning.  Leukocytosis: Likely due to intracranial bleed, slowly trending down. Has remained afebrile continue to monitor off antibiotics.  Elevated troponins: Denies any chest pain likely due to intracranial bleed.  Suicidal thoughts with plan: Psych has been consulted.   Stage I  ulcer present on admission on the right buttock RN Pressure Injury Documentation: Pressure Injury 03/14/21 Buttocks Right Deep Tissue Pressure Injury - Purple or maroon localized area of discolored  intact skin or blood-filled blister due to damage of underlying soft tissue from pressure and/or shear. (Active)  03/14/21 1500  Location: Buttocks  Location Orientation: Right  Staging: Deep Tissue Pressure Injury - Purple or maroon localized area of discolored intact skin or blood-filled blister due to damage of underlying soft tissue from pressure and/or shear.  Wound Description (Comments):   Present on Admission: Yes    Estimated body mass index is 20.18 kg/m as calculated from the following:   Height as of 03/13/21: 5\' 6"  (1.676 m).   Weight as of 03/13/21: 56.7 kg.   DVT prophylaxis: none Family Communication:none Status is: Inpatient  Remains inpatient appropriate because:Hemodynamically unstable  Dispo: The patient is from: Home              Anticipated d/c is to: SNF              Patient currently is not medically stable to d/c.   Difficult to place patient No   Code Status:     Code Status Orders  (From admission, onward)           Start     Ordered   03/14/21 1656  Full code  Continuous        03/14/21 1656           Code Status History     Date Active Date Inactive Code Status Order ID Comments User Context   03/14/2021 0926 03/14/2021 1420 Full Code [id number]  Lucile ShuttersAgbata, Tochukwu, MD ED   03/14/2021 0639 03/14/2021 0926 Full Code [id number]  Andris Baumannuncan, Hazel V, MD ED   09/10/2019 1450 09/11/2019 2140 Full Code [id number]  Sherryll BurgerShah,  Vipul, MD ED         IV Access:   Peripheral IV   Procedures and diagnostic studies:   MR ANGIO HEAD WO CONTRAST  Result Date: 03/15/2021 CLINICAL DATA:  Cerebral hemorrhage. EXAM: MRA HEAD WITHOUT CONTRAST TECHNIQUE: Angiographic images of the Circle of Willis were acquired using MRA technique without intravenous contrast. COMPARISON:  MRI 03/14/2021. FINDINGS: Anterior circulation: Bilateral intracranial ICAs, MCAs, and ACAs are patent without proximal flow limiting stenosis. Approximately 2-3 mm conical outpouching arising  from the posterolateral right cavernous ICA (series 407, image 25; series 4, image 55 with suggestion of possible small vessel arising from the tip, favoring an infundibulum over aneurysm. No clear evidence of a vascular malformation in the region of the right periventricular hemorrhage, although acute blood products limits evaluation. Please note that the hemorrhages at the vertex are not imaged on this MRA. Posterior circulation: Left dominant intradural vertebral artery. Bilateral intradural vertebral arteries, basilar artery, and posterior cerebral arteries are patent without proximal hemodynamically significant stenosis. No aneurysm identified. IMPRESSION: 1. No large vessel occlusion or proximal hemodynamically significant stenosis. 2. No clear evidence of a vascular malformation in the region of the right periventricular hemorrhage, although acute blood products limits evaluation. Please note that the hemorrhages at the vertex are not imaged on this MRA. 3. Approximately 2-3 mm conical outpouching arising from the posterolateral right distal cavernous ICA with suggestion of a small vessel arising from the tip, favoring infundibulum over aneurysm. A CTA could confirm if clinically indicated. Electronically Signed   By: Feliberto Harts MD   On: 03/15/2021 11:31   ECHOCARDIOGRAM COMPLETE  Result Date: 03/15/2021    ECHOCARDIOGRAM REPORT   Patient Name:   Jenna Powers Date of Exam: 03/15/2021 Medical Rec #:  [id number]         Height:       66.0 in Accession #:    [id number]        Weight:       125.0 lb Date of Birth:  [birth year]         BSA:          1.638 m Patient Age:    80 years          BP:           138/66 mmHg Patient Gender: F                 HR:           66 bpm. Exam Location:  Inpatient Procedure: 2D Echo, Cardiac Doppler and Color Doppler Indications:    CVA  History:        Patient has prior history of Echocardiogram examinations, most                 recent 09/10/2019. Stroke,  Signs/Symptoms:CKD; Risk                 Factors:Hypertension, Dyslipidemia and Current Smoker.  Sonographer:    Lavenia Atlas Referring Phys: [id number] ALLISON WOLFE IMPRESSIONS  1. Left ventricular ejection fraction, by estimation, is 60 to 65%. The left ventricle has normal function. The left ventricle has no regional wall motion abnormalities. There is mild left ventricular hypertrophy. Left ventricular diastolic parameters are consistent with Grade III diastolic dysfunction (restrictive). Elevated left atrial pressure.  2. Right ventricular systolic function is normal. The right ventricular size is normal. There is severely elevated pulmonary artery systolic pressure. The estimated right ventricular systolic pressure is 77.3 mmHg.  3. Left atrial size was mildly dilated.  4. The mitral valve is normal in structure. Trivial mitral valve regurgitation. No evidence of mitral stenosis.  5. Tricuspid valve regurgitation is moderate.  6. The aortic valve is tricuspid. Aortic valve regurgitation is not visualized. No aortic stenosis is present.  7. The inferior vena cava is normal in size with greater than 50% respiratory variability, suggesting right atrial pressure of 3 mmHg. FINDINGS  Left Ventricle: Left ventricular ejection fraction, by estimation, is 60 to 65%. The left ventricle has normal function. The left ventricle has no regional wall motion abnormalities. The left ventricular internal cavity size was normal in size. There is  mild left ventricular hypertrophy. Left ventricular diastolic parameters are consistent with Grade III diastolic dysfunction (restrictive). Elevated left atrial pressure. Right Ventricle: The right ventricular size is normal. No increase in right ventricular wall thickness. Right ventricular systolic function is normal. There is severely elevated pulmonary artery systolic pressure. The tricuspid regurgitant velocity is 4.31 m/s, and with an assumed right atrial pressure of 3 mmHg,  the estimated right ventricular systolic pressure is 77.3 mmHg. Left Atrium: Left atrial size was mildly dilated. Right Atrium: Right atrial size was normal in size. Pericardium: Trivial pericardial effusion is present. Mitral Valve: The mitral valve is normal in structure. Trivial mitral valve regurgitation. No evidence of mitral valve stenosis. Tricuspid Valve: The tricuspid valve is normal in structure. Tricuspid valve regurgitation is moderate. Aortic Valve: The aortic valve is tricuspid. Aortic valve regurgitation is not visualized. No aortic stenosis is present. Pulmonic Valve: The pulmonic valve was not well visualized. Pulmonic valve regurgitation is not visualized. Aorta: The aortic root and ascending aorta are structurally normal, with no evidence of dilitation. Venous: The inferior vena cava is normal in size with greater than 50% respiratory variability, suggesting right atrial pressure of 3 mmHg. IAS/Shunts: The interatrial septum was not well visualized.  LEFT VENTRICLE PLAX 2D LVIDd:         3.90 cm  Diastology LVIDs:         2.60 cm  LV e' medial:    5.66 cm/s LV PW:         1.10 cm  LV E/e' medial:  18.9 LV IVS:        1.30 cm  LV e' lateral:   9.14 cm/s LVOT diam:     2.00 cm  LV E/e' lateral: 11.7 LV SV:         74 LV SV Index:   45 LVOT Area:     3.14 cm  RIGHT VENTRICLE RV Basal diam:  3.20 cm RV S prime:     12.30 cm/s TAPSE (M-mode): 2.1 cm LEFT ATRIUM             Index       RIGHT ATRIUM           Index LA diam:        2.40 cm 1.47 cm/m  RA Area:     14.80 cm LA Vol (A2C):   53.9 ml 32.91 ml/m RA Volume:   35.70 ml  21.80 ml/m LA Vol (A4C):   56.2 ml 34.32 ml/m LA Biplane Vol: 58.7 ml 35.84 ml/m  AORTIC VALVE LVOT Vmax:   115.00 cm/s LVOT Vmean:  65.200 cm/s LVOT VTI:    0.237 m  AORTA Ao Root diam: 2.90 cm MITRAL VALVE                TRICUSPID VALVE MV Area (PHT):  4.01 cm     TR Peak grad:   74.3 mmHg MV Decel Time: 189 msec     TR Vmax:        431.00 cm/s MV E velocity: 107.00 cm/s  MV A velocity: 45.30 cm/s   SHUNTS MV E/A ratio:  2.36         Systemic VTI:  0.24 m                             Systemic Diam: 2.00 cm Epifanio Lesches MD Electronically signed by Epifanio Lesches MD Signature Date/Time: 03/15/2021/10:07:04 AM    Final    VAS US CAROTID  Result Date: 03/15/2021 Carotid Arterial Duplex Study Patient Name:  SHANOAH ASBILL Powers  Date of Exam:   03/15/2021 Medical Rec #: [id number]          Accession #:    [id number] Date of Birth: 07/14/1941          Patient Gender: F Patient Age:   80Y Exam Location:  [facility] Procedure:      VAS US CAROTID Referring Phys: [id number] Marvel Plan --------------------------------------------------------------------------------  Indications:       CVA. Risk Factors:      Hypertension, hyperlipidemia, prior CVA. Comparison Study:  no prior Performing Technologist: Argentina Ponder RVS  Examination Guidelines: A complete evaluation includes B-mode imaging, spectral Doppler, color Doppler, and power Doppler as needed of all accessible portions of each vessel. Bilateral testing is considered an integral part of a complete examination. Limited examinations for reoccurring indications may be performed as noted.  Right Carotid Findings: +----------+--------+--------+--------+------------------+--------+           PSV cm/sEDV cm/sStenosisPlaque DescriptionComments +----------+--------+--------+--------+------------------+--------+ CCA Prox  37      12              heterogenous               +----------+--------+--------+--------+------------------+--------+ CCA Distal49      17              heterogenous               +----------+--------+--------+--------+------------------+--------+ ICA Prox  58      18      1-39%   heterogenous               +----------+--------+--------+--------+------------------+--------+ ICA Distal71      25                                          +----------+--------+--------+--------+------------------+--------+ ECA       80      12                                         +----------+--------+--------+--------+------------------+--------+ +----------+--------+-------+--------+-------------------+           PSV cm/sEDV cmsDescribeArm Pressure (mmHG) +----------+--------+-------+--------+-------------------+ [id number]                                         +----------+--------+-------+--------+-------------------+ +---------+--------+--+--------+--+---------+ VertebralPSV cm/s44EDV cm/s10Antegrade +---------+--------+--+--------+--+---------+  Left Carotid Findings: +----------+--------+--------+--------+------------------+--------+           PSV cm/sEDV cm/sStenosisPlaque DescriptionComments +----------+--------+--------+--------+------------------+--------+ CCA Prox  49      13              heterogenous               +----------+--------+--------+--------+------------------+--------+ CCA Distal53      15              heterogenous               +----------+--------+--------+--------+------------------+--------+ ICA Prox  102     34      1-39%   heterogenous               +----------+--------+--------+--------+------------------+--------+ ICA Distal93      22                                         +----------+--------+--------+--------+------------------+--------+ ECA       118     17                                         +----------+--------+--------+--------+------------------+--------+ +----------+--------+--------+--------+-------------------+           PSV cm/sEDV cm/sDescribeArm Pressure (mmHG) +----------+--------+--------+--------+-------------------+ [id number]                                         +----------+--------+--------+--------+-------------------+ +---------+--------+--+--------+--+---------+ VertebralPSV cm/s99EDV cm/s29Antegrade  +---------+--------+--+--------+--+---------+   Summary: Right Carotid: Velocities in the right ICA are consistent with a 1-39% stenosis. Left Carotid: Velocities in the left ICA are consistent with a 1-39% stenosis. Vertebrals: Bilateral vertebral arteries demonstrate antegrade flow. *See table(s) above for measurements and observations.     Preliminary      Medical Consultants:   None.   Subjective:    August Gosser Powers sleepy this morning.  Objective:    Vitals:   03/16/21 1602 03/16/21 2019 03/16/21 2341 03/17/21 0451  BP: (!) 164/90 (!) 175/91 (!) 168/102 116/66  Pulse: 91  (!) 101   Resp: 18  20 16   Temp: 98.6 F (37 C) 98.1 F (36.7 C) 97.8 F (36.6 C) 98.2 F (36.8 C)  TempSrc: Oral Oral Axillary Oral  SpO2: 96%      SpO2: 96 %   Intake/Output Summary (Last 24 hours) at 03/17/2021 03/19/2021 Last data filed at 03/16/2021 2300 Gross per 24 hour  Intake 620 ml  Output 400 ml  Net 220 ml    There were no vitals filed for this visit.  Exam: General exam: In no acute distress, she expressed suicidal thoughts and a plan, physical therapy and nursing. Respiratory system: Good air movement and clear to auscultation. Cardiovascular system: S1 & S2 heard, RRR. No JVD. Gastrointestinal system: Abdomen is nondistended, soft and nontender.  Extremities: No pedal edema. Skin: No rashes, lesions or ulcers  Data Reviewed:    Labs: Basic Metabolic Panel: Recent Labs  Lab 03/13/21 1806 03/14/21 0401  NA 134* 139  K 3.6 3.6  CL 97* 103  CO2 27 27  GLUCOSE 127* 112*  BUN 38* 43*  CREATININE 1.81* 1.65*  CALCIUM 9.3 8.7*  MG 1.9  --     GFR Estimated Creatinine Clearance: 23.5 mL/min (A) (by C-G formula based on SCr of 1.65 mg/dL (H)). Liver Function Tests: Recent Labs  Lab 03/13/21  1806  AST 73*  ALT 36  ALKPHOS 87  BILITOT 1.3*  PROT 7.2  ALBUMIN 4.0    No results for input(s): LIPASE, AMYLASE in the last 168 hours. No results for input(s): AMMONIA in  the last 168 hours. Coagulation profile Recent Labs  Lab 03/13/21 1806 03/14/21 1021  INR 1.1 1.1    COVID-19 Labs  No results for input(s): DDIMER, FERRITIN, LDH, CRP in the last 72 hours.  Lab Results  Component Value Date   SARSCOV2NAA NEGATIVE 03/13/2021   SARSCOV2NAA NEGATIVE 09/10/2019    CBC: Recent Labs  Lab 03/13/21 1806 03/14/21 0401 03/14/21 1021  WBC 24.2* 15.3* 13.8*  NEUTROABS 20.1* 11.4*  --   HGB 12.5 10.6* 11.2*  HCT 36.8 31.5* 32.6*  MCV 93.6 95.2 95.6  PLT 428* 296 316    Cardiac Enzymes: Recent Labs  Lab 03/13/21 1806 03/14/21 0401  CKTOTAL 1,390* 817*    BNP (last 3 results) No results for input(s): PROBNP in the last 8760 hours. CBG: Recent Labs  Lab 03/14/21 1310  GLUCAP 102*    D-Dimer: No results for input(s): DDIMER in the last 72 hours. Hgb A1c: Recent Labs    03/14/21 1644  HGBA1C 5.7*    Lipid Profile: Recent Labs    03/14/21 1644  CHOL 225*  HDL 45  LDLCALC 136*  TRIG 221*  CHOLHDL 5.0    Thyroid function studies: No results for input(s): TSH, T4TOTAL, T3FREE, THYROIDAB in the last 72 hours.  Invalid input(s): FREET3 Anemia work up: No results for input(s): VITAMINB12, FOLATE, FERRITIN, TIBC, IRON, RETICCTPCT in the last 72 hours. Sepsis Labs: Recent Labs  Lab 03/13/21 1806 03/14/21 0401 03/14/21 1021  WBC 24.2* 15.3* 13.8*    Microbiology Recent Results (from the past 240 hour(s))  Resp Panel by RT-PCR (Flu A&B, Covid) Nasopharyngeal Swab     Status: None   Collection Time: 03/13/21  6:06 PM   Specimen: Nasopharyngeal Swab; Nasopharyngeal(NP) swabs in vial transport medium  Result Value Ref Range Status   SARS Coronavirus 2 by RT PCR NEGATIVE NEGATIVE Final    Comment: (NOTE) SARS-CoV-2 target nucleic acids are NOT DETECTED.  The SARS-CoV-2 RNA is generally detectable in upper respiratory specimens during the acute phase of infection. The lowest concentration of SARS-CoV-2 viral copies this  assay can detect is 138 copies/mL. A negative result does not preclude SARS-Cov-2 infection and should not be used as the sole basis for treatment or other patient management decisions. A negative result may occur with  improper specimen collection/handling, submission of specimen other than nasopharyngeal swab, presence of viral mutation(s) within the areas targeted by this assay, and inadequate number of viral copies(<138 copies/mL). A negative result must be combined with clinical observations, patient history, and epidemiological information. The expected result is Negative.  Fact Sheet for Patients:  BloggerCourse.com  Fact Sheet for Healthcare Providers:  SeriousBroker.it  This test is no t yet approved or cleared by the [place] FDA and  has been authorized for detection and/or diagnosis of SARS-CoV-2 by FDA under an Emergency Use Authorization (EUA). This EUA will remain  in effect (meaning this test can be used) for the duration of the COVID-19 declaration under Section 564(b)(1) of the Act, 21 U.S.C.section 360bbb-3(b)(1), unless the authorization is terminated  or revoked sooner.       Influenza A by PCR NEGATIVE NEGATIVE Final   Influenza B by PCR NEGATIVE NEGATIVE Final    Comment: (NOTE) The Xpert Xpress SARS-CoV-2/FLU/RSV plus assay  is intended as an aid in the diagnosis of influenza from Nasopharyngeal swab specimens and should not be used as a sole basis for treatment. Nasal washings and aspirates are unacceptable for Xpert Xpress SARS-CoV-2/FLU/RSV testing.  Fact Sheet for Patients: BloggerCourse.com  Fact Sheet for Healthcare Providers: SeriousBroker.it  This test is not yet approved or cleared by the [place] FDA and has been authorized for detection and/or diagnosis of SARS-CoV-2 by FDA under an Emergency Use Authorization (EUA). This EUA will  remain in effect (meaning this test can be used) for the duration of the COVID-19 declaration under Section 564(b)(1) of the Act, 21 U.S.C. section 360bbb-3(b)(1), unless the authorization is terminated or revoked.  Performed at [facility], [address] [place], [place] [address]      Medications:    atorvastatin  20 mg Oral Daily   heparin injection (subcutaneous)  5,000 Units Subcutaneous Q8H   levothyroxine  25 mcg Oral q morning   lisinopril  20 mg Oral BID   melatonin  3 mg Oral QHS   sertraline  100 mg Oral Daily   traZODone  200 mg Oral QHS   Continuous Infusions:     LOS: 3 days   Marinda Elk  [facility]  03/17/2021, 7:27 AM

## 2021-03-17 NOTE — Consult Note (Signed)
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 female patient admitted with significant for depression, history of CVA essential hypertension brought in to the ED after EMS found her on the floor on the right side covered in urine and feces CT of the head without contrast shows small bleed in the right frontal and periventricular area suspicious for metastatic process question traumatic.  HPI:  ON patient reported to her RN that she was having SI and had attempted to strangle herself with her Purewick tubing.  On assessment this AM patient reported that she was in a lot of pain last night when she reported SI. Patient denies that she actually wants to kill herself. Patient reports that she remains in still a lot of pain. Patient also reports that she is aware that "I have blood on my brain" and that she was found by her son after a fall while trying to take her medications. Patient reported that she was very independent prior to her fall and she is anxious about what this fall can mean in regards to her independence. Patient reports she had PT yesterday and she was unable to walk. Patient is upset about not being able to complete this task in PT and is worried about her future. Patient denies HI and AVH as well.   Past Psychiatric History: MDD, on Zoloft 100mg  and Trazodone for sleep 200mg  QHS  Risk to Self:  NO Risk to Others:    NO Prior Inpatient Therapy:  NO Prior Outpatient Therapy:  No, treated by her PCP  Past Medical History:  Past Medical History:  Diagnosis Date   Depression    Hyperlipemia    Hypertension    Neuropathy    Stroke (HCC)    Thyroid disease    Tremor     Past Surgical History:  Procedure Laterality Date   ABDOMINAL HYSTERECTOMY     ANKLE SURGERY Right    Family History:  Family History  Problem Relation Age of Onset   Pneumonia Mother    Hypertension Father    Heart disease Father    Stroke Father    Family Psychiatric  History: None known Social History:  Social History   Substance and Sexual Activity  Alcohol Use Not Currently     Social History   Substance and Sexual Activity  Drug Use No    Social History   Socioeconomic History   Marital status: Married    Spouse name: Not on file   Number of children: Not on file   Years of education: Not on file   Highest education level: Not on file  Occupational History   Not on file  Tobacco Use   Smoking status: Former    Packs/day: 0.15    Types: Cigarettes    Quit date: 07/2019    Years since quitting: 1.6   Smokeless tobacco: Never  Vaping Use   Vaping Use: Never used  Substance and Sexual  Activity   Alcohol use: Not Currently   Drug use: No   Sexual activity: Not on file  Other Topics Concern   Not on file  Social History Narrative   Not on file   Social Determinants of Health   Financial Resource Strain: Not on file  Food Insecurity: Not on file  Transportation Needs: Not on file  Physical Activity: Not on file  Stress: Not on file  Social Connections: Not on file   Additional Social History:    Allergies:  No Known Allergies  Labs:  Results for orders placed or performed during the hospital encounter of 03/14/21 (from the past 48 hour(s))  Basic metabolic panel     Status: Abnormal   Collection Time: 03/17/21  9:01 AM  Result Value Ref Range   Sodium 135 135 - 145 mmol/L   Potassium  3.2 (L) 3.5 - 5.1 mmol/L   Chloride 98 98 - 111 mmol/L   CO2 27 22 - 32 mmol/L   Glucose, Bld 124 (H) 70 - 99 mg/dL    Comment: Glucose reference range applies only to samples taken after fasting for at least 8 hours.   BUN 16 8 - 23 mg/dL   Creatinine, Ser 0.98 (H) 0.44 - 1.00 mg/dL   Calcium 9.1 8.9 - 11.9 mg/dL   GFR, Estimated 50 (L) >60 mL/min    Comment: (NOTE) Calculated using the CKD-EPI Creatinine Equation (2021)    Anion gap 10 5 - 15    Comment: Performed at [facility], [address] [place], [place] [address]    Current Facility-Administered Medications  Medication Dose Route Frequency Provider Last Rate Last Admin   acetaminophen (TYLENOL) tablet 650 mg  650 mg Oral Q4H PRN Orland Mustard, MD   650 mg at 03/16/21 1200   Or   acetaminophen (TYLENOL) 160 MG/5ML solution 650 mg  650 mg Per Tube Q4H PRN Orland Mustard, MD       Or   acetaminophen (TYLENOL) suppository 650 mg  650 mg Rectal Q4H PRN Orland Mustard, MD       atorvastatin (LIPITOR) tablet 20 mg  20 mg Oral Daily Marvel Plan, MD   20 mg at 03/17/21 0944   haloperidol lactate (HALDOL) injection 2 mg  2 mg Intravenous Q6H PRN Marinda Elk, MD   2 mg at 03/16/21 1201   heparin injection 5,000 Units  5,000 Units Subcutaneous Q8H Marvel Plan, MD   5,000 Units at 03/17/21 0500   hydrALAZINE (APRESOLINE) injection 10 mg  10 mg Intravenous Q4H PRN Milon Dikes, MD   10 mg at 03/16/21 2349   labetalol (NORMODYNE) injection 10 mg  10 mg Intravenous Q2H PRN Milon Dikes, MD   10 mg at 03/14/21 1959   levothyroxine (SYNTHROID) tablet 25 mcg  25 mcg Oral q morning Orland Mustard, MD   25 mcg at 03/17/21 0500   lisinopril (ZESTRIL) tablet 20 mg  20 mg Oral BID Marvel Plan, MD   20 mg at 03/17/21 0944   LORazepam (ATIVAN) tablet 0.5 mg  0.5 mg Oral Q12H PRN Marinda Elk, MD   0.5 mg at 03/16/21 2340   melatonin tablet 3 mg  3 mg Oral QHS Marinda Elk, MD   3 mg at 03/16/21 2101   ondansetron  (ZOFRAN) injection 4 mg  4 mg Intravenous Q6H PRN Marinda Elk, MD       sertraline (ZOLOFT) tablet 100 mg  100 mg Oral Daily Orland Mustard,  MD   100 mg at 03/17/21 0944   traMADol (ULTRAM) tablet 50 mg  50 mg Oral Q6H PRN Marinda Elk, MD   50 mg at 03/17/21 0034   traZODone (DESYREL) tablet 200 mg  200 mg Oral QHS Marinda Elk, MD   200 mg at 03/16/21 2101    Musculoskeletal: Strength & Muscle Tone: decreased Gait & Station:  remains in bed Patient leans: N/A            Psychiatric Specialty Exam:  Presentation  General Appearance: Appropriate for Environment  Eye Contact:Minimal  Speech:Clear and Coherent  Speech Volume:Normal  Handedness: No data recorded  Mood and Affect  Mood:Anxious; Dysphoric  Affect:Congruent   Thought Process  Thought Processes:Coherent  Descriptions of Associations:Intact  Orientation:Full (Time, Place and Person)  Thought Content:Logical; Perseveration (really peseverating on her fall and what this means for her life moving forward)  History of Schizophrenia/Schizoaffective disorder:No data recorded Duration of Psychotic Symptoms:No data recorded Hallucinations:Hallucinations: None  Ideas of Reference:None  Suicidal Thoughts:Suicidal Thoughts: No  Homicidal Thoughts:Homicidal Thoughts: No   Sensorium  Memory:Immediate Good; Recent Fair; Remote Fair  Judgment:Intact  Insight:Fair   Executive Functions  Concentration:Fair  Attention Span:Good  Recall:Fair  Fund of Knowledge:Good  Language:Good   Psychomotor Activity  Psychomotor Activity:Psychomotor Activity: Decreased   Assets  Assets:Communication Skills; Desire for Improvement; Resilience; Social Support   Sleep  Sleep:Sleep: Fair   Physical Exam: Physical Exam HENT:     Head: Normocephalic and atraumatic.  Eyes:     Extraocular Movements: Extraocular movements intact.     Conjunctiva/sclera: Conjunctivae normal.   Cardiovascular:     Rate and Rhythm: Normal rate.  Pulmonary:     Effort: Pulmonary effort is normal.     Breath sounds: Normal breath sounds.  Abdominal:     General: Abdomen is flat.  Musculoskeletal:        General: Tenderness present.  Skin:    General: Skin is warm and dry.     Findings: Bruising present.  Neurological:     General: No focal deficit present.     Mental Status: She is alert.   Review of Systems  Constitutional:  Negative for chills and fever.  HENT:  Negative for hearing loss.   Eyes:  Negative for blurred vision.  Respiratory:  Negative for cough and wheezing.   Cardiovascular:  Negative for chest pain.  Gastrointestinal:  Negative for abdominal pain.  Neurological:  Negative for dizziness.  Psychiatric/Behavioral:  Negative for hallucinations and suicidal ideas.   Blood pressure 135/76, pulse 97, temperature 98.2 F (36.8 C), temperature source Oral, resp. rate 19, SpO2 94 %. There is no height or weight on file to calculate BMI.  Treatment Plan Summary: Plan per primary Hx of MDD Adjustment to recent Health decline Patient does not endorse SI. Rather assessment displayed that patient is grappling with her recent diagnosis and the impact that this can have on her life. Patient's reaction and response is not unexpected. Patient was accepting to the idea of talking with chaplain services about this life event. - Recommend continue OP Zoloft 100mg  and trazodone 200mg  QHS - Recommend consult to Chaplain services - Pain mgmt per primary   Patient is determined to be psychiatrically stable at this time. Psychiatry will sign off. Please do not hesitate to call back if questions arise. Thank you for this consult.   Disposition: Supportive therapy provided about ongoing stressors.  PGY-2 , MD 03/17/2021 12:25 PM

## 2021-03-17 NOTE — Progress Notes (Signed)
Physical Therapy Treatment Patient Details Name: Jenna Powers MRN: [id number] DOB: 20-Sep-1940 Today's Date: 03/17/2021    History of Present Illness 80 yo female presented to ED 03/13/21 after being found on the floor with R-sided weakness. Pt is a transfer from [facility]. CT scan of the brain shows an acute intraparenchymal hemorrhage within the medial high right frontal lobe with adjacent edema as well as a left to right midline shift. MRI: 8 mm acute hemorrhage adjacent to R lateral ventricle, 2.8 cm hemorrhage L posterior frontal vertex, 3-4 MM focal hemorhages R frontoparietal and L posterior frontal vertex, L subdural effusion with left-to-right shift of 3 mm.  PMH OA depression HLD HTN neuropathy thyroid disease tremor    PT Comments    Focused session on facilitating muscle activation for improved balance. Pt with no activation of R lower extremity when cued, resulting in needing a knee block with transfers to stand this date. Minimal activation noted in R UE. Facilitated core control with rotation while sitting EOB, needing up to maxA to maintain her balance. Pt needing cues to look superiorly to improve her posture. Recommendations updated to SNF per CIR sign off. Will continue to follow acutely.   Follow Up Recommendations  SNF;Supervision/Assistance - 24 hour     Equipment Recommendations  3in1 (PT);Wheelchair (measurements PT);Wheelchair cushion (measurements PT);Hospital bed;Other (comment) (lift; may change with progression)    Recommendations for Other Services       Precautions / Restrictions Precautions Precautions: Fall Restrictions Weight Bearing Restrictions: No    Mobility  Bed Mobility Overal bed mobility: Needs Assistance Bed Mobility: Supine to Sit;Sit to Supine     Supine to sit: +2 for physical assistance;+2 for safety/equipment;HOB elevated;Max assist Sit to supine: +2 for physical assistance;+2 for safety/equipment;Max assist   General bed mobility  comments: MaxAx2 to manage legs (primarily the R) and initiate trunk ascent to sit up . Pt initiating return to supine through leaning laterally onto L elbow, not initiating lifting of legs    Transfers Overall transfer level: Needs assistance Equipment used: 2 person hand held assist Transfers: Sit to/from Stand Sit to Stand: +2 physical assistance;+2 safety/equipment;Max assist         General transfer comment: blocking of R LE completely with hyper extension noted. pt static standing with (A) to elevate trunk, cues for neck extension, pt with no scapula retraction noted. pt reports standing does help with buttock discomfort  Ambulation/Gait             General Gait Details: Deferred to focus on balance this date.   Stairs             Wheelchair Mobility    Modified Rankin (Stroke Patients Only) Modified Rankin (Stroke Patients Only) Pre-Morbid Rankin Score: No significant disability Modified Rankin: Severe disability     Balance Overall balance assessment: Needs assistance Sitting-balance support: Single extremity supported;Feet supported Sitting balance-Leahy Scale: Poor Sitting balance - Comments: flexed forward posture and reliant on therapist for static sitting, up to maxA, especially with rotating trunk   Standing balance support: Bilateral upper extremity supported;During functional activity Standing balance-Leahy Scale: Poor Standing balance comment: Pt requires (A) to support R UE no activation noted                            Cognition Arousal/Alertness: Awake/alert Behavior During Therapy: Flat affect Overall Cognitive Status: No family/caregiver present to determine baseline cognitive functioning  General Comments: pt reports "i can't" pt smiling at times during session. pt recalls sitters name as Harriett Sine. pt recalling seeing PT previous session but thought it was OT. ( to be fair same color  uniform close in height and both with same color hair)      Exercises Other Exercises Other Exercises: attempted activation of UE with lateral lean, bil UE use and  reaching. Other Exercises: attempted activation of knee extension bil and only firing L LE Other Exercises: Core activation with facilitated trunk rotation sitting EOB    General Comments General comments (skin integrity, edema, etc.): noted to have bruising on buttock. music played "Airline pilot"      Pertinent Vitals/Pain Pain Assessment: Faces Faces Pain Scale: Hurts little more Pain Location: buttock Pain Descriptors / Indicators: Discomfort;Grimacing;Guarding Pain Intervention(s): Limited activity within patient's tolerance;Monitored during session;Repositioned    Home Living                      Prior Function            PT Goals (current goals can now be found in the care plan section) Acute Rehab PT Goals Patient Stated Goal: to be able to walk again dance PT Goal Formulation: With patient Time For Goal Achievement: 03/29/21 Potential to Achieve Goals: Fair Progress towards PT goals: Progressing toward goals    Frequency    Min 3X/week      PT Plan Discharge plan needs to be updated;Frequency needs to be updated    Co-evaluation PT/OT/SLP Co-Evaluation/Treatment: Yes Reason for Co-Treatment: Complexity of the patient's impairments (multi-system involvement);Necessary to address cognition/behavior during functional activity;For patient/therapist safety;To address functional/ADL transfers PT goals addressed during session: Mobility/safety with mobility;Balance        AM-PAC PT "6 Clicks" Mobility   Outcome Measure  Help needed turning from your back to your side while in a flat bed without using bedrails?: A Lot Help needed moving from lying on your back to sitting on the side of a flat bed without using bedrails?: Total Help needed moving to and from a bed to a chair (including a  wheelchair)?: Total Help needed standing up from a chair using your arms (e.g., wheelchair or bedside chair)?: Total Help needed to walk in hospital room?: Total Help needed climbing 3-5 steps with a railing? : Total 6 Click Score: 7    End of Session Equipment Utilized During Treatment: Gait belt Activity Tolerance: Patient limited by fatigue Patient left: in bed;with call bell/phone within reach;with bed alarm set;with nursing/sitter in room Nurse Communication: Mobility status PT Visit Diagnosis: Unsteadiness on feet (R26.81);Other abnormalities of gait and mobility (R26.89);Muscle weakness (generalized) (M62.81);Difficulty in walking, not elsewhere classified (R26.2);Apraxia (R48.2);Other symptoms and signs involving the nervous system (R29.898);Hemiplegia and hemiparesis Hemiplegia - Right/Left: Right Hemiplegia - dominant/non-dominant: Dominant Hemiplegia - caused by: Nontraumatic intracerebral hemorrhage     Time: 4765-4650 PT Time Calculation (min) (ACUTE ONLY): 23 min  Charges:  $Neuromuscular Re-education: 8-22 mins                     Raymond Gurney, PT, DPT Acute Rehabilitation Services  Pager: [phone] Office: [phone]    Jenna Powers 03/17/2021, 6:08 PM

## 2021-03-18 MED ORDER — POLYETHYLENE GLYCOL 3350 17 G PO PACK
17.0000 g | PACK | Freq: Two times a day (BID) | ORAL | Status: AC
  Administered 2021-03-18 – 2021-03-19 (×2): 17 g via ORAL
  Filled 2021-03-18 (×4): qty 1

## 2021-03-18 MED ORDER — HYDROCODONE-ACETAMINOPHEN 5-325 MG PO TABS
1.0000 | ORAL_TABLET | ORAL | Status: DC | PRN
  Administered 2021-03-18: 1 via ORAL
  Administered 2021-03-18 (×2): 2 via ORAL
  Administered 2021-03-19 (×2): 1 via ORAL
  Administered 2021-03-20 – 2021-03-21 (×6): 2 via ORAL
  Filled 2021-03-18: qty 2
  Filled 2021-03-18: qty 1
  Filled 2021-03-18 (×2): qty 2
  Filled 2021-03-18: qty 1
  Filled 2021-03-18 (×5): qty 2
  Filled 2021-03-18: qty 1

## 2021-03-18 MED ORDER — METHOCARBAMOL 500 MG PO TABS: 500.0000 mg | ORAL_TABLET | Freq: Four times a day (QID) | ORAL | Status: DC | PRN

## 2021-03-18 NOTE — Progress Notes (Signed)
[facility] PROGRESS NOTE    Progress Note  Jenna Powers  YOV:[id number] DOB: 07/21/1941 DOA: 03/14/2021 PCP: Dortha Kern, MD     Brief Narrative:   Jenna Powers is an 80 y.o. female past medical history significant for depression, history of CVA essential hypertension brought in to the ED after EMS found her on the floor on the right side covered in urine and feces CT of the head without contrast shows small bleed in the right frontal and periventricular area suspicious for metastatic process question traumatic.  Neurology was consulted.    Assessment/Plan:   Intraparenchymal hemorrhage of brain: MRI of the brain showed 8 mm acute hemorrhage and a 2.8 acute hemorrhage of the left posterior frontal vertex with mild surrounding edema. Carotid Dopplers unremarkable. 2D echo showed an EF of 60% with grade 3 diastolic heart failure IV labetalol as needed as needed LDL 136, she was started on low-dose statin. Further management per the stroke team.  On no further antithrombotic therapy. MRA of the brain showed no large occlusions. She does not qualify for CIR, TOC to start working skilled nursing facility placement.  Rhabdomyolysis nontraumatic: Resolved with IV fluid hydration , KVO IV fluids.  Acute kidney injury: Baseline creatinine around 1 on admission 1.8 in the setting of ARB use rhabdomyolysis and decreased oral intake. Resolved her creatinine has returned to baseline.  Leukocytosis: Likely due to intracranial bleed, slowly trending down. Has remained afebrile continue to monitor off antibiotics.  Elevated troponins: Denies any chest pain likely due to intracranial bleed.  Suicidal thoughts with plan: Psych is cleared the patient on suicidal thoughts.   Stage I  ulcer present on admission on the right buttock RN Pressure Injury Documentation: Pressure Injury 03/14/21 Buttocks Right Deep Tissue Pressure Injury - Purple or maroon localized area of  discolored intact skin or blood-filled blister due to damage of underlying soft tissue from pressure and/or shear. (Active)  03/14/21 1500  Location: Buttocks  Location Orientation: Right  Staging: Deep Tissue Pressure Injury - Purple or maroon localized area of discolored intact skin or blood-filled blister due to damage of underlying soft tissue from pressure and/or shear.  Wound Description (Comments):   Present on Admission: Yes    Estimated body mass index is 20.18 kg/m as calculated from the following:   Height as of 03/13/21: 5\' 6"  (1.676 m).   Weight as of 03/13/21: 56.7 kg.   DVT prophylaxis: none Family Communication:none Status is: Inpatient  Remains inpatient appropriate because:Hemodynamically unstable  Dispo: The patient is from: Home              Anticipated d/c is to: SNF              Patient currently is not medically stable to d/c.   Difficult to place patient No   Code Status:     Code Status Orders  (From admission, onward)           Start     Ordered   03/14/21 1656  Full code  Continuous        03/14/21 1656           Code Status History     Date Active Date Inactive Code Status Order ID Comments User Context   03/14/2021 0926 03/14/2021 1420 Full Code 03/16/2021  [id number], MD ED   03/14/2021 0639 03/14/2021 0926 Full Code 03/16/2021  [id number], MD ED   09/10/2019 1450 09/11/2019 2140 Full Code 09/13/2019  Delfino Lovett, MD ED         IV Access:   Peripheral IV   Procedures and diagnostic studies:   No results found.   Medical Consultants:   None.   Subjective:    [name] relates her pain is not controlled.  Objective:    Vitals:   03/17/21 2115 03/18/21 0300 03/18/21 0554 03/18/21 0809  BP: (!) 153/70 139/63 139/69 (!) 142/83  Pulse: (!) 107 84 89 97  Resp: 16 16 16 18   Temp: (!) 97.4 F (36.3 C) 98.6 F (37 C) 98.4 F (36.9 C) 98.7 F (37.1 C)  TempSrc: Oral Oral Oral Oral  SpO2: 94%  93%  93%   SpO2: 93 %   Intake/Output Summary (Last 24 hours) at 03/18/2021 03/20/2021 Last data filed at 03/18/2021 0831 Gross per 24 hour  Intake 760 ml  Output --  Net 760 ml    There were no vitals filed for this visit.  Exam: General exam: In no acute distress. Respiratory system: Good air movement and clear to auscultation. Cardiovascular system: S1 & S2 heard, RRR. No JVD. Gastrointestinal system: Abdomen is nondistended, soft and nontender.  Extremities: No pedal edema. Skin: No rashes, lesions or ulcers  Data Reviewed:    Labs: Basic Metabolic Panel: Recent Labs  Lab 03/13/21 1806 03/14/21 0401 03/17/21 0901  NA 134* 139 135  K 3.6 3.6 3.2*  CL 97* 103 98  CO2 27 27 27   GLUCOSE 127* 112* 124*  BUN 38* 43* 16  CREATININE 1.81* 1.65* 1.11*  CALCIUM 9.3 8.7* 9.1  MG 1.9  --   --     GFR Estimated Creatinine Clearance: 35 mL/min (A) (by C-G formula based on SCr of 1.11 mg/dL (H)). Liver Function Tests: Recent Labs  Lab 03/13/21 1806  AST 73*  ALT 36  ALKPHOS 87  BILITOT 1.3*  PROT 7.2  ALBUMIN 4.0    No results for input(s): LIPASE, AMYLASE in the last 168 hours. No results for input(s): AMMONIA in the last 168 hours. Coagulation profile Recent Labs  Lab 03/13/21 1806 03/14/21 1021  INR 1.1 1.1    COVID-19 Labs  No results for input(s): DDIMER, FERRITIN, LDH, CRP in the last 72 hours.  Lab Results  Component Value Date   SARSCOV2NAA NEGATIVE 03/13/2021   SARSCOV2NAA NEGATIVE 09/10/2019    CBC: Recent Labs  Lab 03/13/21 1806 03/14/21 0401 03/14/21 1021  WBC 24.2* 15.3* 13.8*  NEUTROABS 20.1* 11.4*  --   HGB 12.5 10.6* 11.2*  HCT 36.8 31.5* 32.6*  MCV 93.6 95.2 95.6  PLT 428* 296 316    Cardiac Enzymes: Recent Labs  Lab 03/13/21 1806 03/14/21 0401  CKTOTAL 1,390* 817*    BNP (last 3 results) No results for input(s): PROBNP in the last 8760 hours. CBG: Recent Labs  Lab 03/14/21 1310  GLUCAP 102*    D-Dimer: No results  for input(s): DDIMER in the last 72 hours. Hgb A1c: No results for input(s): HGBA1C in the last 72 hours.  Lipid Profile: No results for input(s): CHOL, HDL, LDLCALC, TRIG, CHOLHDL, LDLDIRECT in the last 72 hours.  Thyroid function studies: No results for input(s): TSH, T4TOTAL, T3FREE, THYROIDAB in the last 72 hours.  Invalid input(s): FREET3 Anemia work up: No results for input(s): VITAMINB12, FOLATE, FERRITIN, TIBC, IRON, RETICCTPCT in the last 72 hours. Sepsis Labs: Recent Labs  Lab 03/13/21 1806 03/14/21 0401 03/14/21 1021  WBC 24.2* 15.3* 13.8*    Microbiology Recent Results (from  the past 240 hour(s))  Resp Panel by RT-PCR (Flu A&B, Covid) Nasopharyngeal Swab     Status: None   Collection Time: 03/13/21  6:06 PM   Specimen: Nasopharyngeal Swab; Nasopharyngeal(NP) swabs in vial transport medium  Result Value Ref Range Status   SARS Coronavirus 2 by RT PCR NEGATIVE NEGATIVE Final    Comment: (NOTE) SARS-CoV-2 target nucleic acids are NOT DETECTED.  The SARS-CoV-2 RNA is generally detectable in upper respiratory specimens during the acute phase of infection. The lowest concentration of SARS-CoV-2 viral copies this assay can detect is 138 copies/mL. A negative result does not preclude SARS-Cov-2 infection and should not be used as the sole basis for treatment or other patient management decisions. A negative result may occur with  improper specimen collection/handling, submission of specimen other than nasopharyngeal swab, presence of viral mutation(s) within the areas targeted by this assay, and inadequate number of viral copies(<138 copies/mL). A negative result must be combined with clinical observations, patient history, and epidemiological information. The expected result is Negative.  Fact Sheet for Patients:  BloggerCourse.com  Fact Sheet for Healthcare Providers:  SeriousBroker.it  This test is no t yet  approved or cleared by the [place] FDA and  has been authorized for detection and/or diagnosis of SARS-CoV-2 by FDA under an Emergency Use Authorization (EUA). This EUA will remain  in effect (meaning this test can be used) for the duration of the COVID-19 declaration under Section 564(b)(1) of the Act, 21 U.S.C.section 360bbb-3(b)(1), unless the authorization is terminated  or revoked sooner.       Influenza A by PCR NEGATIVE NEGATIVE Final   Influenza B by PCR NEGATIVE NEGATIVE Final    Comment: (NOTE) The Xpert Xpress SARS-CoV-2/FLU/RSV plus assay is intended as an aid in the diagnosis of influenza from Nasopharyngeal swab specimens and should not be used as a sole basis for treatment. Nasal washings and aspirates are unacceptable for Xpert Xpress SARS-CoV-2/FLU/RSV testing.  Fact Sheet for Patients: BloggerCourse.com  Fact Sheet for Healthcare Providers: SeriousBroker.it  This test is not yet approved or cleared by the [place] FDA and has been authorized for detection and/or diagnosis of SARS-CoV-2 by FDA under an Emergency Use Authorization (EUA). This EUA will remain in effect (meaning this test can be used) for the duration of the COVID-19 declaration under Section 564(b)(1) of the Act, 21 U.S.C. section 360bbb-3(b)(1), unless the authorization is terminated or revoked.  Performed at [facility], [address] [place], [place] [address]      Medications:    atorvastatin  20 mg Oral Daily   heparin injection (subcutaneous)  5,000 Units Subcutaneous Q8H   levothyroxine  25 mcg Oral q morning   lisinopril  20 mg Oral BID   melatonin  3 mg Oral QHS   sertraline  100 mg Oral Daily   traZODone  200 mg Oral QHS   Continuous Infusions:     LOS: 4 days   Marinda Elk  [facility]  03/18/2021, 9:29 AM

## 2021-03-19 NOTE — Plan of Care (Signed)
  Problem: Intracerebral Hemorrhage Tissue Perfusion: Goal: Complications of Intracerebral Hemorrhage will be minimized Outcome: Progressing   Problem: Spontaneous Subarachnoid Hemorrhage Tissue Perfusion: Goal: Complications of Spontaneous Subarachnoid Hemorrhage will be minimized Outcome: Progressing   Problem: Activity: Goal: Risk for activity intolerance will decrease Outcome: Progressing   Problem: Elimination: Goal: Will not experience complications related to urinary retention Outcome: Progressing   Problem: Pain Managment: Goal: General experience of comfort will improve Outcome: Progressing

## 2021-03-19 NOTE — NC FL2 (Signed)
[place] MEDICAID FL2 LEVEL OF CARE SCREENING TOOL     IDENTIFICATION  Patient Name: Jenna Powers Birthdate: 08-Jun-1941 Sex: female Admission Date (Current Location): 03/14/2021  [facility] and [place] Number:  Chiropodist and Address:  [facility], 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) 03/13/2021   Rhabdomyolysis 03/13/2021   Fall at home, initial encounter 03/13/2021   Leukocytosis 03/13/2021   Elevated troponin 03/13/2021   Intraparenchymal hemorrhage of brain (HCC) 03/13/2021   Acute right hemiparesis (HCC) 03/13/2021   Syncope 09/10/2019   Other specified hypothyroidism 04/22/2019   Essential hypertension 12/23/2018   Anxiety 12/23/2018   History of depression 12/23/2018    Orientation RESPIRATION BLADDER Height & Weight     Self, Time, Situation, Place  Normal Incontinent, External catheter Weight:   Height:     BEHAVIORAL SYMPTOMS/MOOD NEUROLOGICAL BOWEL NUTRITION STATUS      Continent Diet (please see dc summary)  AMBULATORY STATUS COMMUNICATION OF NEEDS Skin   Extensive Assist Verbally Other (Comment) (deep tissue injury on buttocks)                       Personal Care Assistance Level of Assistance  Bathing, Feeding, Dressing Bathing Assistance: Maximum assistance Feeding assistance: Independent Dressing Assistance: Limited assistance     Functional Limitations Info             SPECIAL CARE FACTORS FREQUENCY  PT (By licensed PT),  OT (By licensed OT)     PT Frequency: 5x/week OT Frequency: 5x/week            Contractures Contractures Info: Not present    Additional Factors Info  Code Status, Allergies, Psychotropic Code Status Info: Full Allergies Info: NKA Psychotropic Info: Zoloft, trazadone         Current Medications (03/19/2021):  This is the current hospital active medication list Current Facility-Administered Medications  Medication Dose Route Frequency Provider Last Rate Last Admin   acetaminophen (TYLENOL) tablet 650 mg  650 mg Oral Q4H PRN Marinda Elk, MD   650 mg at 03/16/21 1200   Or   acetaminophen (TYLENOL) 160 MG/5ML solution 650 mg  650 mg Per Tube Q4H PRN Marinda Elk, MD       Or   acetaminophen (TYLENOL) suppository 650 mg  650 mg Rectal Q4H PRN Marinda Elk, MD       atorvastatin (LIPITOR) tablet 20 mg  20 mg Oral Daily Marinda Elk, MD   20 mg at 03/19/21 1101   haloperidol lactate (HALDOL) injection 2 mg  2 mg Intravenous Q6H PRN Marinda Elk, MD   2 mg at 03/16/21 1201   heparin injection 5,000 Units  5,000 Units Subcutaneous Q8H Marinda Elk, MD   5,000 Units at 03/19/21 1414   hydrALAZINE (APRESOLINE) injection 10 mg  10 mg Intravenous Q4H PRN Marinda Elk, MD   10 mg at 03/16/21 2349   HYDROcodone-acetaminophen (NORCO/VICODIN) 5-325 MG  per tablet 1-2 tablet  1-2 tablet Oral Q4H PRN Marinda Elk, MD   1 tablet at 03/19/21 1056   labetalol (NORMODYNE) injection 10 mg  10 mg Intravenous Q2H PRN Marinda Elk, MD   10 mg at 03/14/21 1959   levothyroxine (SYNTHROID) tablet 25 mcg  25 mcg Oral q morning Marinda Elk, MD   25 mcg at 03/19/21 0610   lisinopril (ZESTRIL) tablet 20 mg  20 mg Oral BID Marinda Elk, MD   20 mg at 03/19/21 1056   LORazepam (ATIVAN) tablet 0.5 mg  0.5 mg Oral Q12H PRN Marinda Elk, MD   0.5 mg at 03/19/21 1414   melatonin tablet 3 mg  3 mg Oral QHS Marinda Elk, MD   3 mg at 03/18/21 2208   ondansetron (ZOFRAN) injection 4 mg  4 mg Intravenous Q6H PRN Marinda Elk, MD   4 mg at 03/18/21 2212   polyethylene glycol (MIRALAX / GLYCOLAX) packet 17 g  17 g Oral BID Marinda Elk, MD   17 g at 03/18/21 2208   sertraline (ZOLOFT) tablet 100 mg  100 mg Oral Daily Marinda Elk, MD   100 mg at 03/19/21 1056   traMADol (ULTRAM) tablet 50 mg  50 mg Oral Q6H PRN Marinda Elk, MD   50 mg at 03/19/21 0650   traZODone (DESYREL) tablet 200 mg  200 mg Oral QHS Marinda Elk, MD   200 mg at 03/18/21 2208     Discharge Medications: Please see discharge summary for a list of discharge medications.  Relevant Imaging Results:  Relevant Lab Results:   Additional Information SSN: [id number]. Moderna COVID-19 Vaccine 05/02/2020, 10/24/2019 , 09/26/2019  Mearl Latin, LCSW

## 2021-03-19 NOTE — Progress Notes (Signed)
[facility] PROGRESS NOTE    Progress Note  Jenna Powers  OJJ:[id number] DOB: June 10, 1941 DOA: 03/14/2021 PCP: Dortha Kern, MD     Brief Narrative:   Jenna Powers is an 80 y.o. female past medical history significant for depression, history of CVA essential hypertension brought in to the ED after EMS found her on the floor on the right side covered in urine and feces CT of the head without contrast shows small bleed in the right frontal and periventricular area suspicious for metastatic process question traumatic.  Neurology was consulted.  Patient is stable to be transferred to skilled nursing facility, awaiting skilled nursing facility placement.  Assessment/Plan:   Intraparenchymal hemorrhage of brain: MRI of the brain showed 8 mm acute hemorrhage and a 2.8 acute hemorrhage of the left posterior frontal vertex with mild surrounding edema. Carotid Dopplers unremarkable. 2D echo showed an EF of 60% with grade 3 diastolic heart failure IV labetalol as needed as needed LDL 136, she was started on low-dose statin.  On no further antithrombotic therapy. MRA of the brain showed no large occlusions.  Rhabdomyolysis nontraumatic: Resolved with IV fluid hydration , KVO IV fluids.  Acute kidney injury: Baseline creatinine around 1 on admission 1.8 in the setting of ARB use rhabdomyolysis and decreased oral intake. Resolved her creatinine has returned to baseline.  Leukocytosis: Likely due to intracranial bleed, slowly trending down. Has remained afebrile continue to monitor off antibiotics.  Elevated troponins: Denies any chest pain likely due to intracranial bleed.  Suicidal thoughts with plan: Psych is cleared the patient on suicidal thoughts.   Stage I  ulcer present on admission on the right buttock RN Pressure Injury Documentation: Pressure Injury 03/14/21 Buttocks Right Deep Tissue Pressure Injury - Purple or maroon localized area of discolored intact  skin or blood-filled blister due to damage of underlying soft tissue from pressure and/or shear. (Active)  03/14/21 1500  Location: Buttocks  Location Orientation: Right  Staging: Deep Tissue Pressure Injury - Purple or maroon localized area of discolored intact skin or blood-filled blister due to damage of underlying soft tissue from pressure and/or shear.  Wound Description (Comments):   Present on Admission: Yes    Estimated body mass index is 20.18 kg/m as calculated from the following:   Height as of 03/13/21: 5\' 6"  (1.676 m).   Weight as of 03/13/21: 56.7 kg.   DVT prophylaxis: none Family Communication:none Status is: Inpatient  Remains inpatient appropriate because:Hemodynamically unstable  Dispo: The patient is from: Home              Anticipated d/c is to: SNF              Patient currently is not medically stable to d/c.   Difficult to place patient No   Code Status:     Code Status Orders  (From admission, onward)           Start     Ordered   03/14/21 1656  Full code  Continuous        03/14/21 1656           Code Status History     Date Active Date Inactive Code Status Order ID Comments User Context   03/14/2021 0926 03/14/2021 1420 Full Code 03/16/2021  [id number], MD ED   03/14/2021 0639 03/14/2021 0926 Full Code 03/16/2021  [id number], MD ED   09/10/2019 1450 09/11/2019 2140 Full Code 09/13/2019  [id number], MD ED  IV Access:   Peripheral IV   Procedures and diagnostic studies:   No results found.   Medical Consultants:   None.   Subjective:    Jenna Powers relates her pain is controlled  Objective:    Vitals:   03/18/21 1936 03/18/21 2300 03/19/21 0428 03/19/21 0839  BP: (!) 151/80 138/70 132/76 98/63  Pulse: 97 93 88 (!) 109  Resp: 18 18 16 14   Temp: 98 F (36.7 C) 98.6 F (37 C) 97.7 F (36.5 C) 97.9 F (36.6 C)  TempSrc: Oral Oral Oral Oral  SpO2: 98% 99% 96% 96%   SpO2: 96  %   Intake/Output Summary (Last 24 hours) at 03/19/2021 1022 Last data filed at 03/19/2021 0600 Gross per 24 hour  Intake 420 ml  Output 1100 ml  Net -680 ml    There were no vitals filed for this visit.  Exam: General exam: In no acute distress. Respiratory system: Good air movement and clear to auscultation. Cardiovascular system: S1 & S2 heard, RRR. No JVD. Gastrointestinal system: Abdomen is nondistended, soft and nontender.  Extremities: No pedal edema. Skin: No rashes, lesions or ulcers  Data Reviewed:    Labs: Basic Metabolic Panel: Recent Labs  Lab 03/13/21 1806 03/14/21 0401 03/17/21 0901  NA 134* 139 135  K 3.6 3.6 3.2*  CL 97* 103 98  CO2 27 27 27   GLUCOSE 127* 112* 124*  BUN 38* 43* 16  CREATININE 1.81* 1.65* 1.11*  CALCIUM 9.3 8.7* 9.1  MG 1.9  --   --     GFR Estimated Creatinine Clearance: 35 mL/min (A) (by C-G formula based on SCr of 1.11 mg/dL (H)). Liver Function Tests: Recent Labs  Lab 03/13/21 1806  AST 73*  ALT 36  ALKPHOS 87  BILITOT 1.3*  PROT 7.2  ALBUMIN 4.0    No results for input(s): LIPASE, AMYLASE in the last 168 hours. No results for input(s): AMMONIA in the last 168 hours. Coagulation profile Recent Labs  Lab 03/13/21 1806 03/14/21 1021  INR 1.1 1.1    COVID-19 Labs  No results for input(s): DDIMER, FERRITIN, LDH, CRP in the last 72 hours.  Lab Results  Component Value Date   SARSCOV2NAA NEGATIVE 03/13/2021   SARSCOV2NAA NEGATIVE 09/10/2019    CBC: Recent Labs  Lab 03/13/21 1806 03/14/21 0401 03/14/21 1021  WBC 24.2* 15.3* 13.8*  NEUTROABS 20.1* 11.4*  --   HGB 12.5 10.6* 11.2*  HCT 36.8 31.5* 32.6*  MCV 93.6 95.2 95.6  PLT 428* 296 316    Cardiac Enzymes: Recent Labs  Lab 03/13/21 1806 03/14/21 0401  CKTOTAL 1,390* 817*    BNP (last 3 results) No results for input(s): PROBNP in the last 8760 hours. CBG: Recent Labs  Lab 03/14/21 1310  GLUCAP 102*    D-Dimer: No results for  input(s): DDIMER in the last 72 hours. Hgb A1c: No results for input(s): HGBA1C in the last 72 hours.  Lipid Profile: No results for input(s): CHOL, HDL, LDLCALC, TRIG, CHOLHDL, LDLDIRECT in the last 72 hours.  Thyroid function studies: No results for input(s): TSH, T4TOTAL, T3FREE, THYROIDAB in the last 72 hours.  Invalid input(s): FREET3 Anemia work up: No results for input(s): VITAMINB12, FOLATE, FERRITIN, TIBC, IRON, RETICCTPCT in the last 72 hours. Sepsis Labs: Recent Labs  Lab 03/13/21 1806 03/14/21 0401 03/14/21 1021  WBC 24.2* 15.3* 13.8*    Microbiology Recent Results (from the past 240 hour(s))  Resp Panel by RT-PCR (Flu A&B, Covid) Nasopharyngeal Swab  Status: None   Collection Time: 03/13/21  6:06 PM   Specimen: Nasopharyngeal Swab; Nasopharyngeal(NP) swabs in vial transport medium  Result Value Ref Range Status   SARS Coronavirus 2 by RT PCR NEGATIVE NEGATIVE Final    Comment: (NOTE) SARS-CoV-2 target nucleic acids are NOT DETECTED.  The SARS-CoV-2 RNA is generally detectable in upper respiratory specimens during the acute phase of infection. The lowest concentration of SARS-CoV-2 viral copies this assay can detect is 138 copies/mL. A negative result does not preclude SARS-Cov-2 infection and should not be used as the sole basis for treatment or other patient management decisions. A negative result may occur with  improper specimen collection/handling, submission of specimen other than nasopharyngeal swab, presence of viral mutation(s) within the areas targeted by this assay, and inadequate number of viral copies(<138 copies/mL). A negative result must be combined with clinical observations, patient history, and epidemiological information. The expected result is Negative.  Fact Sheet for Patients:  BloggerCourse.com  Fact Sheet for Healthcare Providers:  SeriousBroker.it  This test is no t yet  approved or cleared by the [place] FDA and  has been authorized for detection and/or diagnosis of SARS-CoV-2 by FDA under an Emergency Use Authorization (EUA). This EUA will remain  in effect (meaning this test can be used) for the duration of the COVID-19 declaration under Section 564(b)(1) of the Act, 21 U.S.C.section 360bbb-3(b)(1), unless the authorization is terminated  or revoked sooner.       Influenza A by PCR NEGATIVE NEGATIVE Final   Influenza B by PCR NEGATIVE NEGATIVE Final    Comment: (NOTE) The Xpert Xpress SARS-CoV-2/FLU/RSV plus assay is intended as an aid in the diagnosis of influenza from Nasopharyngeal swab specimens and should not be used as a sole basis for treatment. Nasal washings and aspirates are unacceptable for Xpert Xpress SARS-CoV-2/FLU/RSV testing.  Fact Sheet for Patients: BloggerCourse.com  Fact Sheet for Healthcare Providers: SeriousBroker.it  This test is not yet approved or cleared by the [place] FDA and has been authorized for detection and/or diagnosis of SARS-CoV-2 by FDA under an Emergency Use Authorization (EUA). This EUA will remain in effect (meaning this test can be used) for the duration of the COVID-19 declaration under Section 564(b)(1) of the Act, 21 U.S.C. section 360bbb-3(b)(1), unless the authorization is terminated or revoked.  Performed at [facility], [address] [place], [place] [address]      Medications:    atorvastatin  20 mg Oral Daily   heparin injection (subcutaneous)  5,000 Units Subcutaneous Q8H   levothyroxine  25 mcg Oral q morning   lisinopril  20 mg Oral BID   melatonin  3 mg Oral QHS   polyethylene glycol  17 g Oral BID   sertraline  100 mg Oral Daily   traZODone  200 mg Oral QHS   Continuous Infusions:     LOS: 5 days   Marinda Elk  [facility]  03/19/2021, 10:22 AM

## 2021-03-19 NOTE — Plan of Care (Signed)
Pt is alert oriented x 4. Pt has purewick in place, Q2 turn. Pt denies suicidal thought. Makes conversation about her sons. Pt c/o pain to right lower leg, prn pain medication given with effective results.   Problem: Education: Goal: Knowledge of patient specific risk factors addressed and post discharge goals established will improve Outcome: Progressing

## 2021-03-19 NOTE — TOC Initial Note (Signed)
Transition of Care [facility]) - Initial/Assessment Note    Patient Details  Name: Jenna Powers MRN: [id number] Date of Birth: 1941-05-14  Transition of Care [facility]) CM/SW Contact:    Jenna Latin, LCSW Phone Number: 03/19/2021, 2:42 PM  Clinical Narrative:                 CSW received consult for possible SNF placement at time of discharge. CSW spoke with patient. Patient reported that she lives alone and due to patient's current physical needs and fall risk she is concerned about going home. Patient expressed understanding of PT recommendation and is agreeable to SNF placement at time of discharge. Patient reports preference for somewhere near [place] since that is where her son lives. CSW discussed insurance authorization process and provided Medicare SNF ratings list. Patient has received three COVID vaccines. Patient expressed being hopeful for rehab and to feel better soon. No further questions reported at this time.   Skilled Nursing Rehab Facilities-   [email or web address]   Ratings out of 5 possible   Name Address  Phone # Quality Care Staffing Health Inspection Overall  [facility] [address] [place] [phone] 4  4 4   [facility] [address] [place], [place] 2 3 1 1   [facility] [address], [facility] [place] 5 2 2 3   [facility] [address] [facility] [phone] 5 2 2 3   [address] 109 [facility]. [phone], [address] 3 1 1 1   [facility] [address], [place] [phone] 4 1 4 3   [facility] [address] [place], [address] [place] 4 3 3 3   [facility] [address], [facility] [phone] 4 2 3 3   [address], [place] [address] [phone] , [facility] [address] 2 1 1 1   [facility] [address] [place] [phone] 1 2 1 1   [facility] [address], [place] 712 5 5 5 5   [facility] [address], [phone] 3 1 1 1   [facility] [address] [phone] 4 2 4 4   [facility] [address] [place] [phone] 2 2 1 1   [facility] [address] [place], [phone] 5  5 5   [facility] [address] [place] Shi [phone] 2 1 3 2   [facility] [address] [place], [phone] 4 1 4 3   [facility] [place] [address], [place] [phone] 4 3 4 4   [address] [place], [place] [phone] 3 1 1 1   [facility] [address] [place] [phone] 2 2 3 3     Expected Discharge Plan: Skilled Nursing Facility Barriers to Discharge: Insurance Authorization, SNF Pending bed offer   Patient Goals and CMS Choice Patient states their goals for this hospitalization and ongoing recovery are:: Rehab CMS Medicare.gov Compare Post Acute Care list provided to:: Patient Choice offered to / list presented to : Patient  Expected Discharge Plan and Services Expected Discharge Plan: Skilled Nursing Facility In-house Referral: Clinical Social Work   Post Acute Care Choice: Skilled Nursing Facility Living arrangements for the past 2 months: Single Family Home                                      Prior Living Arrangements/Services Living arrangements for the past 2 months: Single Family Home Lives  with:: Self Patient language and need for interpreter reviewed:: Yes Do you feel safe going back to the place where you live?: Yes      Need for Family Participation in Patient Care: Yes (Comment) Care giver support system in place?: Yes (comment)   Criminal Activity/Legal Involvement Pertinent to Current Situation/Hospitalization: No - Comment as needed  Activities of Daily Living      Permission Sought/Granted Permission sought to share information with : Facility Medical sales representative, Family Supports Permission granted to share information with  : Yes, Verbal Permission Granted  Share Information with NAME: Jenna Powers ([name])  Permission granted to share info w AGENCY: SNFs  Permission granted to share info w Relationship: Son  Permission granted to share info w Contact Information: [phone]  Emotional Assessment Appearance:: Appears stated age Attitude/Demeanor/Rapport: Engaged Affect (typically observed): Accepting, Appropriate Orientation: : Oriented to Self, Oriented to Place, Oriented to  Time, Oriented to Situation Alcohol / Substance Use: Not Applicable Psych Involvement: Yes (comment) (cleared by psych)  Admission diagnosis:  Intraparenchymal hemorrhage of brain [facility]) [I61.9] Patient Active Problem List   Diagnosis Date Noted   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) 03/13/2021   Rhabdomyolysis 03/13/2021   Fall at home, initial encounter 03/13/2021   Leukocytosis 03/13/2021   Elevated troponin 03/13/2021   Intraparenchymal hemorrhage of brain (HCC) 03/13/2021   Acute right hemiparesis (HCC) 03/13/2021   Syncope 09/10/2019   Other specified hypothyroidism 04/22/2019   Essential hypertension 12/23/2018   Anxiety 12/23/2018   History of depression 12/23/2018   PCP:  Jenna Kern, MD Pharmacy:   [facility] [phone] Dan Humphreys, [place] - [address] [facility] [address] AT [facility] [address] [facility] [place] [address] Phone: [phone] Fax: [phone]     Social Determinants of Health (SDOH) Interventions    Readmission Risk Interventions No flowsheet data found.

## 2021-03-20 MED ORDER — ATORVASTATIN CALCIUM 20 MG PO TABS: 20.0000 mg | ORAL_TABLET | Freq: Every day | ORAL | Status: DC

## 2021-03-20 NOTE — Plan of Care (Signed)
Patient is alert oriented x 4. Pt has right sided weakness with tingling and pain to right arm and right leg. Pt has has episodes of anxiety and pain, treated with PRN medications, effective. Pt states she would like to talk to doctor about outcomes of this, she states she isn't happy like this as she is used to moving around and now she isn't able to do so. Pt states she isn't suicidal, and doesn't desire to hurt self.  Pt is anxious to work with PT and discuss possibility of improvement.   Problem: Education: Goal: Knowledge of disease or condition will improve Outcome: Progressing Goal: Knowledge of secondary prevention will improve Outcome: Progressing Goal: Knowledge of patient specific risk factors addressed and post discharge goals established will improve Outcome: Progressing   Problem: Self-Care: Goal: Ability to participate in self-care as condition permits will improve Outcome: Progressing   Problem: Intracerebral Hemorrhage Tissue Perfusion: Goal: Complications of Intracerebral Hemorrhage will be minimized Outcome: Progressing   Problem: Spontaneous Subarachnoid Hemorrhage Tissue Perfusion: Goal: Complications of Spontaneous Subarachnoid Hemorrhage will be minimized Outcome: Progressing   Problem: Activity: Goal: Risk for activity intolerance will decrease Outcome: Progressing   Problem: Elimination: Goal: Will not experience complications related to bowel motility Outcome: Progressing Goal: Will not experience complications related to urinary retention Outcome: Progressing   Problem: Pain Managment: Goal: General experience of comfort will improve Outcome: Progressing

## 2021-03-20 NOTE — Progress Notes (Signed)
Pt used call light for the front desk and stated she intended to strangle herself with the call light and telephone cords. When RN arrived the RN removed all cords away from the pt and instructed why this was necessary for the safety of the pt. MD has been made aware. No new orders placed. RN will continue to monitor.

## 2021-03-20 NOTE — TOC Progression Note (Signed)
Transition of Care [facility]) - Progression Note    Patient Details  Name: Jenna Powers MRN: [id number] Date of Birth: 07-Dec-1940  Transition of Care [facility]) CM/SW [place], [place] Phone Number: 03/20/2021, 10:50 AM  Clinical Narrative:   CSW contacted [facility] to ask them to review referral, and they are able to offer a bed for the patient. CSW met with patient to confirm that she would like to accept bed offer. [facility] to start insurance authorization request through [facility]. CSW to follow.    Expected Discharge Plan: Skilled Nursing Facility Barriers to Discharge: Ship broker, SNF Pending bed offer  Expected Discharge Plan and Services Expected Discharge Plan: [place] In-house Referral: Clinical Social Work   Post Acute Care Choice: [place] Living arrangements for the past 2 months: Single Family Home                                       Social Determinants of Health (SDOH) Interventions    Readmission Risk Interventions No flowsheet data found.

## 2021-03-20 NOTE — Progress Notes (Signed)
Physical Therapy Treatment Patient Details Name: Jenna Powers MRN: [id number] DOB: [birth year] Today's Date: 03/20/2021    History of Present Illness 80 yo female presented to ED 03/13/21 after being found on the floor with R-sided weakness. Pt is a transfer from [facility]. CT scan of the brain shows an acute intraparenchymal hemorrhage within the medial high right frontal lobe with adjacent edema as well as a left to right midline shift. MRI: 8 mm acute hemorrhage adjacent to R lateral ventricle, 2.8 cm hemorrhage L posterior frontal vertex, 3-4 MM focal hemorhages R frontoparietal and L posterior frontal vertex, L subdural effusion with left-to-right shift of 3 mm.  PMH OA depression HLD HTN neuropathy thyroid disease tremor    PT Comments    Pt required max assist bed mobility, +2 max sit to stand, and +2 max SPT. Unable to elicit active movement RLE. Pt in recliner with feet elevated at end of session.    Follow Up Recommendations  SNF;Supervision/Assistance - 24 hour     Equipment Recommendations  3in1 (PT);Wheelchair (measurements PT);Wheelchair cushion (measurements PT);Hospital bed;Other (comment)    Recommendations for Other Services       Precautions / Restrictions Precautions Precautions: Fall;Other (comment) Precaution Comments: R extensor tone    Mobility  Bed Mobility Overal bed mobility: Needs Assistance Bed Mobility: Supine to Sit;Rolling Rolling: Mod assist   Supine to sit: HOB elevated;Max assist     General bed mobility comments: assist with BLE and trunk, use of bed pad to scoot to EOB, cues for sequencing, increased time    Transfers Overall transfer level: Needs assistance   Transfers: Sit to/from Stand;Stand Pivot Transfers Sit to Stand: +2 physical assistance;Max assist Stand pivot transfers: +2 physical assistance;+2 safety/equipment;Max assist       General transfer comment: pivot transfer toward L bed to recliner. Once in recliner sit to stand  with +2 HHA, R knee blocked. Facilitation at hips to come up to full stance. Unable to sustain more than a few seconds.  Ambulation/Gait                 Stairs             Wheelchair Mobility    Modified Rankin (Stroke Patients Only) Modified Rankin (Stroke Patients Only) Pre-Morbid Rankin Score: No significant disability Modified Rankin: Severe disability     Balance Overall balance assessment: Needs assistance Sitting-balance support: Single extremity supported;Feet supported Sitting balance-Leahy Scale: Poor Sitting balance - Comments: forward, flexed posture. Assist to maintain balance   Standing balance support: Bilateral upper extremity supported;During functional activity Standing balance-Leahy Scale: Poor Standing balance comment: heavy reliance on external support                            Cognition Arousal/Alertness: Awake/alert Behavior During Therapy: WFL for tasks assessed/performed;Flat affect Overall Cognitive Status: No family/caregiver present to determine baseline cognitive functioning Area of Impairment: Attention;Memory;Following commands;Safety/judgement;Awareness;Problem solving                   Current Attention Level: Sustained Memory: Decreased short-term memory Following Commands: Follows one step commands with increased time Safety/Judgement: Decreased awareness of safety;Decreased awareness of deficits Awareness: Emergent Problem Solving: Slow processing;Decreased initiation;Difficulty sequencing;Requires verbal cues;Requires tactile cues        Exercises      General Comments        Pertinent Vitals/Pain Pain Assessment: Faces Faces Pain Scale: Hurts even more Pain Location: buttocks  Pain Descriptors / Indicators: Discomfort;Grimacing;Guarding Pain Intervention(s): Monitored during session;Repositioned;Premedicated before session;Limited activity within patient's tolerance    Home Living                       Prior Function            PT Goals (current goals can now be found in the care plan section) Acute Rehab PT Goals Patient Stated Goal: to walk again Progress towards PT goals: Progressing toward goals    Frequency    Min 3X/week      PT Plan Current plan remains appropriate    Co-evaluation              AM-PAC PT "6 Clicks" Mobility   Outcome Measure  Help needed turning from your back to your side while in a flat bed without using bedrails?: A Lot Help needed moving from lying on your back to sitting on the side of a flat bed without using bedrails?: A Lot Help needed moving to and from a bed to a chair (including a wheelchair)?: Total Help needed standing up from a chair using your arms (e.g., wheelchair or bedside chair)?: Total Help needed to walk in hospital room?: Total Help needed climbing 3-5 steps with a railing? : Total 6 Click Score: 8    End of Session Equipment Utilized During Treatment: Gait belt Activity Tolerance: Patient tolerated treatment well Patient left: in chair;with call bell/phone within reach;with chair alarm set Nurse Communication: Mobility status PT Visit Diagnosis: Unsteadiness on feet (R26.81);Other abnormalities of gait and mobility (R26.89);Muscle weakness (generalized) (M62.81);Difficulty in walking, not elsewhere classified (R26.2);Apraxia (R48.2);Other symptoms and signs involving the nervous system (R29.898);Hemiplegia and hemiparesis Hemiplegia - Right/Left: Right Hemiplegia - dominant/non-dominant: Dominant Hemiplegia - caused by: Nontraumatic intracerebral hemorrhage     Time: 0900-0924 PT Time Calculation (min) (ACUTE ONLY): 24 min  Charges:  $Therapeutic Activity: 23-37 mins                     Aida Raider, PT  Office # [phone] Pager [phone]    Jenna Powers 03/20/2021, 10:31 AM

## 2021-03-20 NOTE — Discharge Summary (Signed)
Physician Discharge Summary  Jenna Powers DJS:[id number] DOB: 1941-03-14 DOA: 03/14/2021  PCP: Dortha Kern, MD  Admit date: 03/14/2021 Discharge date: 03/20/2021  Admitted From: Home Disposition:  SNF  Recommendations for Outpatient Follow-up:  Follow up with PCP in 1-2 weeks Please obtain BMP/CBC in one week   Home Health:No Equipment/Devices:none  Discharge Condition:Stable CODE STATUS: Full Diet recommendation: Heart Healthy  Brief/Interim Summary: 80 y.o. female past medical history significant for depression, history of CVA essential hypertension brought in to the ED after EMS found her on the floor on the right side covered in urine and feces CT of the head without contrast shows small bleed in the right frontal and periventricular area suspicious for metastatic process question traumatic.  Neurology was consulted.  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 Carotid Dopplers were unremarkable, 2D echo showed an EF of 60%. LDL was 136 restarted on low-dose statins. She is on no antithrombotic therapy. MRI of the brain showed no large occlusions. Physical therapy evaluated the patient and recommended home health PT.  Nontraumatic rhabdomyolysis: Resolved with IV fluid hydration.  Acute kidney injury: With a baseline creatinine of 1 the setting of ARB rhabdomyolysis these were held she was started on IV fluids her creatinine returned to  baseline.  Leukocytosis: Likely due to intracranial bleed she remained afebrile came down.  Elevated troponins: Denies any chest pain shortness of breath likely due to intracranial bleed no further work-up.    Discharge Instructions  Discharge Instructions     Ambulatory referral to Neurology   Complete by: As directed    Follow up with stroke clinic NP (Jessica Vanschaick or Darrol Angel, if both not available, consider Manson Allan, or Ahern) at [facility] in about 4 weeks. Thanks.   Diet - low sodium heart healthy   Complete by: As directed    Increase activity slowly   Complete by: As directed    No wound care   Complete by: As directed       Allergies as of 03/20/2021   No Known Allergies      Medication List     STOP taking these medications    albuterol 108 (90 Base) MCG/ACT inhaler Commonly known as: VENTOLIN HFA       TAKE these medications    acetaminophen 325 MG tablet Commonly known as: TYLENOL Take 650 mg by mouth every 6 (six) hours as needed for moderate pain.   AeroChamber Plus inhaler Use as instructed   atorvastatin 20 MG tablet Commonly known as: LIPITOR Take 1 tablet (20 mg total) by mouth daily. Start taking on: March 21, 2021   levothyroxine 25 MCG tablet Commonly known as: SYNTHROID Take 25 mcg by mouth every morning.   lisinopril 40 MG tablet Commonly known as: ZESTRIL Take 40 mg by mouth daily.   multivitamin with minerals Tabs tablet Take 1 tablet by mouth daily.   propranolol ER 80 MG 24 hr capsule Commonly known as: INDERAL LA Hold this medication because your heart rate is already low  in 50's without this. What changed:  how much to take how to take this when to take this additional instructions   sertraline 100 MG tablet Commonly known as: ZOLOFT Take 100 mg by mouth daily.   traMADol 50 MG tablet Commonly known as: ULTRAM Take 50 mg by mouth 3 (three) times daily as needed for moderate pain.   traZODone 100  MG tablet Commonly known as: DESYREL Take 200 mg by mouth at bedtime as needed for sleep.   VITAMIN D3 PO Take 1 tablet by mouth daily.   ZINC PO Take 1 tablet by mouth daily.        Follow-up Information     [facility]. Schedule an appointment as soon as possible for a visit in 1 month(s).   Specialty: Neurology Why: stroke clinic Contact information: [address] [place] [address] [phone]               No Known Allergies  Consultations: Orthopedics surgery  Procedures/Studies: DG Tibia/Fibula Right  Result Date: 03/13/2021 CLINICAL DATA:  Status post fall. EXAM: RIGHT TIBIA AND FIBULA - 2 VIEW COMPARISON:  None. FINDINGS: There is no evidence of an acute fracture. A small chronic appearing deformity is seen along the distal aspect of the right lateral malleolus. This represents a new finding when compared to the prior study. 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 Soft tissues are unremarkable. IMPRESSION: 1. No acute osseous abnormality. 2. Chronic and postoperative changes involving the right ankle, as described above. Electronically Signed   By: Aram Candela M.D.   On: 03/13/2021 20:06   CT HEAD WO CONTRAST  Result Date: 03/13/2021 CLINICAL DATA:  Found on floor, right-sided weakness EXAM: CT HEAD WITHOUT CONTRAST CT CERVICAL SPINE WITHOUT CONTRAST TECHNIQUE: Multidetector CT imaging of the head and cervical spine was performed following the standard protocol without intravenous contrast. Multiplanar CT image reconstructions of the cervical spine were also generated. COMPARISON:  CT brain, 09/10/2019 FINDINGS: CT HEAD FINDINGS Brain: No evidence of acute infarction, hydrocephalus, extra-axial collection or mass lesion/mass effect. There is an intraparenchymal hemorrhage within the medial high left frontal  lobe measuring 2.7 cm with adjacent edema (series 2, image 28). There is minimal associated subarachnoid hemorrhage in this vicinity (series 2, image 28). Approximately 4 mm left right midline shift. There is an additional very subtle cortical based hyperdense lesion of the medial high right frontal lobe measuring 0.4 cm (series 2, image 29). There is a hyperdense subependymal lesion about the posterior right lateral ventricle measuring 0.9 cm (series 2, image 18). Vascular: No hyperdense vessel or unexpected calcification. Skull: Normal. Negative for fracture or focal lesion. Sinuses/Orbits: No acute finding. Other: None. CT CERVICAL SPINE FINDINGS Alignment: Degenerative straightening and reversal of the normal cervical lordosis. Skull base and vertebrae: No acute fracture. No primary bone lesion or focal pathologic process. Soft tissues and spinal canal: No prevertebral fluid or swelling. No visible canal hematoma. Disc levels: Moderate to severe disc space height loss and osteophytosis, worst from C4 through C6. Upper chest: Negative. Other: None. IMPRESSION: 1. There is an intraparenchymal hemorrhage within the medial high left frontal lobe measuring 2.7 cm with adjacent edema. There is minimal associated subarachnoid hemorrhage in this vicinity. 2. There is approximately 4 mm left right midline shift. 3. There is an additional very subtle cortical based hyperdense lesion of the medial high right frontal lobe  measuring 0.4 cm, as well as a hyperdense subependymal lesion about the posterior right lateral ventricle measuring 0.9 cm. 4. Constellation of findings is generally suspicious for hemorrhage associated with metastatic lesions. Small emboli from a central source could also produce this appearance. Consider contrast enhanced MRI to further evaluate. 5. No fracture or static subluxation of the cervical spine. 6. Cervical disc degenerative disease. Findings discussed by telephone with Dr. [name] at 6:50 p.m.,  03/13/2021. Electronically Signed   By: Lauralyn Primes M.D.   On: 03/13/2021 18:53   CT Cervical Spine Wo Contrast  Result Date: 03/13/2021 CLINICAL DATA:  Found on floor, right-sided weakness EXAM: CT HEAD WITHOUT CONTRAST CT CERVICAL SPINE WITHOUT CONTRAST TECHNIQUE: Multidetector CT imaging of the head and cervical spine was performed following the standard protocol without intravenous contrast. Multiplanar CT image reconstructions of the cervical spine were also generated. COMPARISON:  CT brain, 09/10/2019 FINDINGS: CT HEAD FINDINGS Brain: No evidence of acute infarction, hydrocephalus, extra-axial collection or mass lesion/mass effect. There is an intraparenchymal hemorrhage within the medial high left frontal lobe measuring 2.7 cm with adjacent edema (series 2, image 28). There is minimal associated subarachnoid hemorrhage in this vicinity (series 2, image 28). Approximately 4 mm left right midline shift. There is an additional very subtle cortical based hyperdense lesion of the medial high right frontal lobe measuring 0.4 cm (series 2, image 29). There is a hyperdense subependymal lesion about the posterior right lateral ventricle measuring 0.9 cm (series 2, image 18). Vascular: No hyperdense vessel or unexpected calcification. Skull: Normal. Negative for fracture or focal lesion. Sinuses/Orbits: No acute finding. Other: None. CT CERVICAL SPINE FINDINGS Alignment: Degenerative straightening and reversal of the normal cervical lordosis. Skull base and vertebrae: No acute fracture. No primary bone lesion or focal pathologic process. Soft tissues and spinal canal: No prevertebral fluid or swelling. No visible canal hematoma. Disc levels: Moderate to severe disc space height loss and osteophytosis, worst from C4 through C6. Upper chest: Negative. Other: None. IMPRESSION: 1. There is an intraparenchymal hemorrhage within the medial high left frontal lobe measuring 2.7 cm with adjacent edema. There is minimal  associated subarachnoid hemorrhage in this vicinity. 2. There is approximately 4 mm left right midline shift. 3. There is an additional very subtle cortical based hyperdense lesion of the medial high right frontal lobe measuring 0.4 cm, as well as a hyperdense subependymal lesion about the posterior right lateral ventricle measuring 0.9 cm. 4. Constellation of findings is generally suspicious for hemorrhage associated with metastatic lesions. Small emboli from a central source could also produce this appearance. Consider contrast enhanced MRI to further evaluate. 5. No fracture or static subluxation of the cervical spine. 6. Cervical disc degenerative disease. Findings discussed by telephone with Dr. [name] at 6:50 p.m., 03/13/2021. Electronically Signed   By: Lauralyn Primes M.D.   On: 03/13/2021 18:53   MR ANGIO HEAD WO CONTRAST  Result Date: 03/15/2021 CLINICAL DATA:  Cerebral hemorrhage. EXAM: MRA HEAD WITHOUT CONTRAST TECHNIQUE: Angiographic images of the Circle of Willis were acquired using MRA technique without intravenous contrast. COMPARISON:  MRI 03/14/2021. FINDINGS: Anterior circulation: Bilateral intracranial ICAs, MCAs, and ACAs are patent without proximal flow limiting stenosis. Approximately 2-3 mm conical outpouching arising from the posterolateral right cavernous ICA (series 407, image 25; series 4, image 55 with suggestion of possible small vessel arising from the tip, favoring an infundibulum over aneurysm. No clear evidence of a vascular malformation in the region of the right periventricular hemorrhage, although acute blood  products limits evaluation. Please note that the hemorrhages at the vertex are not imaged on this MRA. Posterior circulation: Left dominant intradural vertebral artery. Bilateral intradural vertebral arteries, basilar artery, and posterior cerebral arteries are patent without proximal hemodynamically significant stenosis. No aneurysm identified. IMPRESSION: 1. No large  vessel occlusion or proximal hemodynamically significant stenosis. 2. No clear evidence of a vascular malformation in the region of the right periventricular hemorrhage, although acute blood products limits evaluation. Please note that the hemorrhages at the vertex are not imaged on this MRA. 3. Approximately 2-3 mm conical outpouching arising from the posterolateral right distal cavernous ICA with suggestion of a small vessel arising from the tip, favoring infundibulum over aneurysm. A CTA could confirm if clinically indicated. Electronically Signed   By: Feliberto Harts MD   On: 03/15/2021 11:31   MR Brain W and Wo Contrast  Result Date: 03/14/2021 CLINICAL DATA:  Head trauma, moderate to severe. Found on the floor. Right-sided weakness. EXAM: MRI HEAD WITHOUT AND WITH CONTRAST TECHNIQUE: Multiplanar, multiecho pulse sequences of the brain and surrounding structures were obtained without and with intravenous contrast. CONTRAST:  6mL GADAVIST GADOBUTROL 1 MMOL/ML IV SOLN COMPARISON:  Head CT yesterday.  MRI 09/10/2019. FINDINGS: Brain: The study suffers from considerable motion degradation. Diffusion imaging does not show any acute ischemic infarction. No focal abnormality is seen affecting the brainstem or cerebellum. There is an intraparenchymal hemorrhage in the deep white matter adjacent to the atrium of the right lateral ventricle as seen by CT, measuring approximately 8 mm in size. Second intraparenchymal hemorrhage at left posterior frontal vertex with maximal dimension of 2.8 cm. Both of these have not changed since the CT scan yesterday. Mild surrounding edema. 3-4 mm punctate hemorrhage is also noted at the right frontoparietal vertex and at the left posterior frontal region just anterior to the larger hemorrhage. These are actually more visible by CT. Brain otherwise appears normal for age without evidence of age accelerated atrophy or anything more than minimal small vessel change of the deep  white [place] there is a small subdural effusion on the left, maximal thickness 6 mm. No acute blood products. Minimal mass effect with left-to-right shift 3 mm. After contrast administration, no abnormal enhancement is seen at the site of either hemorrhage or elsewhere. No hydrocephalus. Vascular: Major vessels at the base of the brain show flow. Skull and upper cervical spine: Negative Sinuses/Orbits: Clear except for mucosal thickening and fluid in the right maxillary sinus. Orbits negative. Other: None IMPRESSION: 8 mm acute hemorrhage in the deep white matter adjacent to the atrium of the right lateral ventricle. 2.8 cm acute hemorrhage at the left posterior frontal vertex. Mild surrounding edema. 3-4 mm focal hemorrhages noted at the right frontoparietal vertex and at the left posterior frontal vertex just anterior to the larger hemorrhage. These were more apparent at CT. No evidence of old hemorrhage. Minimal age related volume loss with very minimal small vessel change of the white matter. Left subdural effusion, maximal thickness 6 mm, with left-to-right shift of 3 mm. No abnormal contrast enhancement occurs in these locations or elsewhere. The study does not show any finding that would indicate hemorrhagic metastases. Therefore, these are felt to be synchronous intraparenchymal hemorrhages Electronically Signed   By: Paulina Fusi M.D.   On: 03/14/2021 10:32   CT CHEST ABDOMEN PELVIS W CONTRAST  Result Date: 03/14/2021 CLINICAL DATA:  Found down. On floor for 2 days. EXAM: CT CHEST, ABDOMEN, AND PELVIS WITH CONTRAST TECHNIQUE: Multidetector  CT imaging of the chest, abdomen and pelvis was performed following the standard protocol during bolus administration of intravenous contrast. CONTRAST:  75mL OMNIPAQUE IOHEXOL 300 MG/ML  SOLN COMPARISON:  CT abdomen/pelvis from 10/03/2011 FINDINGS: CT CHEST FINDINGS Cardiovascular: The heart is within normal limits in size for age. No pericardial effusion. Advanced  atherosclerotic calcifications involving the thoracic aorta. No dissection. Minimal scattered coronary artery calcifications. Mediastinum/Nodes: No mediastinal or hilar mass or adenopathy. The esophagus is grossly normal. Lungs/Pleura: Chronic appearing bronchitic type lung changes and mild underlying emphysema but no infiltrates, edema or effusions. No worrisome pulmonary lesions. No pneumothorax. Musculoskeletal: No breast masses, supraclavicular or axillary adenopathy. The thyroid gland is unremarkable. The bony thorax is intact. No bone lesions or fractures. Age related osteoporosis and degenerative changes involving the spine. CT ABDOMEN PELVIS FINDINGS Hepatobiliary: No hepatic lesions are identified. The gallbladder is surgically absent and there is associated intra and extrahepatic biliary dilatation. The common bile duct measures up to 12 mm in the porta hepatis and 8 mm in the head of the pancreas. Pancreas: No mass, inflammation or ductal dilatation. Spleen: Normal size. No focal lesions. Adrenals/Urinary Tract: The adrenal glands are normal. Small/atrophic left kidney progressive since the prior study and likely due to renal artery stenosis. There is a stable large fatty mass involving the upper pole region of the right kidney. This measures a maximum of 5.0 x 4.0 cm but is unchanged. Findings consistent with a benign angiomyolipoma. The bladder is unremarkable. Stomach/Bowel: The stomach, duodenum, small bowel and colon are grossly normal without oral contrast. No acute inflammatory process, mass lesions or obstructive findings. Vascular/Lymphatic: Advanced atherosclerotic calcification involving the aorta and branch vessels but no aneurysm. No mesenteric or retroperitoneal mass or adenopathy. Reproductive: The uterus is surgically absent. Both ovaries are still present and are unremarkable and stable. Other: No pelvic mass or adenopathy. No free pelvic fluid collections. No inguinal mass or adenopathy.  No abdominal wall hernia or subcutaneous lesions. Musculoskeletal: There is a moderate-sized intramuscular hematoma noted in the right gluteus maximus muscle. This measures a maximum 5.8 cm. There is also some associated subcutaneous hemorrhage and also some inflammation/edema or hemorrhage in the ischial rectal fossa on the right side. Other areas of subcutaneous inflammation/edema or hemorrhage are noted near the right iliac crest and in the subcutaneous fat overlying the left buttock area. No acute pelvic or hip fractures are identified. Lumbar scoliosis and degenerative changes but no lumbar spine fractures. Both hips are normally located. Mild degenerative changes. 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 IMPRESSION: 1. Moderate-sized intramuscular hematoma in the right gluteus maximus muscle with associated surrounding subcutaneous hemorrhage. 2. No acute pelvic or hip fractures. 3. Stable large right renal angiomyolipoma. 4. Status post cholecystectomy with associated stable intra and extrahepatic biliary dilatation. 5. Advanced atherosclerotic calcification involving the thoracic and abdominal aorta and branch vessels. 6. Chronic appearing bronchitic type lung changes and mild underlying emphysema. No acute pulmonary findings. Aortic Atherosclerosis (ICD10-I70.0) Electronically Signed   By: Rudie Meyer M.D.   On: 03/14/2021 06:25   DG Chest Portable 1 View  Result Date: 03/13/2021 CLINICAL DATA:  Status post fall. EXAM: PORTABLE CHEST 1 VIEW COMPARISON:  None. FINDINGS: There is no evidence of an acute infiltrate, pleural effusion or pneumothorax. The cardiac silhouette is mildly enlarged. Marked severity calcification of the aortic arch is seen. Mild S-shaped scoliosis of the thoracolumbar spine is noted with multilevel degenerative changes. IMPRESSION: Cardiomegaly without acute or active cardiopulmonary disease.  Electronically Signed   By: Aram Candela M.D.   On:  03/13/2021 20:03   ECHOCARDIOGRAM COMPLETE  Result Date: 03/15/2021    ECHOCARDIOGRAM REPORT   Patient Name:   KIENNA MONCADA SAVAGE Date of Exam: 03/15/2021 Medical Rec #:  [id number]         Height:       66.0 in Accession #:    [id number]        Weight:       125.0 lb Date of Birth:  [birth year]         BSA:          1.638 m Patient Age:    80 years          BP:           138/66 mmHg Patient Gender: F                 HR:           66 bpm. Exam Location:  Inpatient Procedure: 2D Echo, Cardiac Doppler and Color Doppler Indications:    CVA  History:        Patient has prior history of Echocardiogram examinations, most                 recent 09/10/2019. Stroke, Signs/Symptoms:CKD; Risk                 Factors:Hypertension, Dyslipidemia and Current Smoker.  Sonographer:    Lavenia Atlas Referring Phys: [id number] ALLISON WOLFE IMPRESSIONS  1. Left ventricular ejection fraction, by estimation, is 60 to 65%. The left ventricle has normal function. The left ventricle has no regional wall motion abnormalities. There is mild left ventricular hypertrophy. Left ventricular diastolic parameters are consistent with Grade III diastolic dysfunction (restrictive). Elevated left atrial pressure.  2. Right ventricular systolic function is normal. The right ventricular size is normal. There is severely elevated pulmonary artery systolic pressure. The estimated right ventricular systolic pressure is 77.3 mmHg.  3. Left atrial size was mildly dilated.  4. The mitral valve is normal in structure. Trivial mitral valve regurgitation. No evidence of mitral stenosis.  5. Tricuspid valve regurgitation is moderate.  6. The aortic valve is tricuspid. Aortic valve regurgitation is not visualized. No aortic stenosis is present.  7. The inferior vena cava is normal in size with greater than 50% respiratory variability, suggesting right atrial pressure of 3 mmHg. FINDINGS  Left Ventricle: Left ventricular ejection fraction, by estimation, is 60 to  65%. The left ventricle has normal function. The left ventricle has no regional wall motion abnormalities. The left ventricular internal cavity size was normal in size. There is  mild left ventricular hypertrophy. Left ventricular diastolic parameters are consistent with Grade III diastolic dysfunction (restrictive). Elevated left atrial pressure. Right Ventricle: The right ventricular size is normal. No increase in right ventricular wall thickness. Right ventricular systolic function is normal. There is severely elevated pulmonary artery systolic pressure. The tricuspid regurgitant velocity is 4.31 m/s, and with an assumed right atrial pressure of 3 mmHg, the estimated right ventricular systolic pressure is 77.3 mmHg. Left Atrium: Left atrial size was mildly dilated. Right Atrium: Right atrial size was normal in size. Pericardium: Trivial pericardial effusion is present. Mitral Valve: The mitral valve is normal in structure. Trivial mitral valve regurgitation. No evidence of mitral valve stenosis. Tricuspid Valve: The tricuspid valve is normal in structure. Tricuspid valve regurgitation is moderate. Aortic Valve: The aortic valve is tricuspid. Aortic valve regurgitation is not  visualized. No aortic stenosis is present. Pulmonic Valve: The pulmonic valve was not well visualized. Pulmonic valve regurgitation is not visualized. Aorta: The aortic root and ascending aorta are structurally normal, with no evidence of dilitation. Venous: The inferior vena cava is normal in size with greater than 50% respiratory variability, suggesting right atrial pressure of 3 mmHg. IAS/Shunts: The interatrial septum was not well visualized.  LEFT VENTRICLE PLAX 2D LVIDd:         3.90 cm  Diastology LVIDs:         2.60 cm  LV e' medial:    5.66 cm/s LV PW:         1.10 cm  LV E/e' medial:  18.9 LV IVS:        1.30 cm  LV e' lateral:   9.14 cm/s LVOT diam:     2.00 cm  LV E/e' lateral: 11.7 LV SV:         74 LV SV Index:   45 LVOT Area:      3.14 cm  RIGHT VENTRICLE RV Basal diam:  3.20 cm RV S prime:     12.30 cm/s TAPSE (M-mode): 2.1 cm LEFT ATRIUM             Index       RIGHT ATRIUM           Index LA diam:        2.40 cm 1.47 cm/m  RA Area:     14.80 cm LA Vol (A2C):   53.9 ml 32.91 ml/m RA Volume:   35.70 ml  21.80 ml/m LA Vol (A4C):   56.2 ml 34.32 ml/m LA Biplane Vol: 58.7 ml 35.84 ml/m  AORTIC VALVE LVOT Vmax:   115.00 cm/s LVOT Vmean:  65.200 cm/s LVOT VTI:    0.237 m  AORTA Ao Root diam: 2.90 cm MITRAL VALVE                TRICUSPID VALVE MV Area (PHT): 4.01 cm     TR Peak grad:   74.3 mmHg MV Decel Time: 189 msec     TR Vmax:        431.00 cm/s MV E velocity: 107.00 cm/s MV A velocity: 45.30 cm/s   SHUNTS MV E/A ratio:  2.36         Systemic VTI:  0.24 m                             Systemic Diam: 2.00 cm Epifanio Lescheshristopher Schumann MD Electronically signed by Epifanio Lescheshristopher Schumann MD Signature Date/Time: 03/15/2021/10:07:04 AM    Final    DG Hip Unilat W or Wo Pelvis 2-3 Views Right  Result Date: 03/13/2021 CLINICAL DATA:  Status post fall. EXAM: DG HIP (WITH OR WITHOUT PELVIS) 2-3V RIGHT COMPARISON:  None. FINDINGS: There is no evidence of hip fracture or dislocation. There is no evidence of arthropathy or other focal bone abnormality. IMPRESSION: Negative. Electronically Signed   By: Aram Candelahaddeus  Houston M.D.   On: 03/13/2021 20:06   VAS US CAROTID  Result Date: 03/20/2021 Carotid Arterial Duplex Study Patient Name:  Shary DecampGLORIA VAN SAVAGE  Date of Exam:   03/15/2021 Medical Rec #: [id number]          Accession #:    [id number] Date of Birth: Apr 22, 1941          Patient Gender: F Patient Age:   80Y [facility] Procedure:  VAS US CAROTID Referring Phys: [id number] Scheryl Marten XU --------------------------------------------------------------------------------  Indications:       CVA. Risk Factors:      Hypertension, hyperlipidemia, prior CVA. Comparison Study:  no prior Performing Technologist: Argentina Ponder RVS   Examination Guidelines: A complete evaluation includes B-mode imaging, spectral Doppler, color Doppler, and power Doppler as needed of all accessible portions of each vessel. Bilateral testing is considered an integral part of a complete examination. Limited examinations for reoccurring indications may be performed as noted.  Right Carotid Findings: +----------+--------+--------+--------+------------------+--------+           PSV cm/sEDV cm/sStenosisPlaque DescriptionComments +----------+--------+--------+--------+------------------+--------+ CCA Prox  37      12              heterogenous               +----------+--------+--------+--------+------------------+--------+ CCA Distal49      17              heterogenous               +----------+--------+--------+--------+------------------+--------+ ICA Prox  58      18      1-39%   heterogenous               +----------+--------+--------+--------+------------------+--------+ ICA Distal71      25                                         +----------+--------+--------+--------+------------------+--------+ ECA       80      12                                         +----------+--------+--------+--------+------------------+--------+ +----------+--------+-------+--------+-------------------+           PSV cm/sEDV cmsDescribeArm Pressure (mmHG) +----------+--------+-------+--------+-------------------+ [id number]                                         +----------+--------+-------+--------+-------------------+ +---------+--------+--+--------+--+---------+ VertebralPSV cm/s44EDV cm/s10Antegrade +---------+--------+--+--------+--+---------+  Left Carotid Findings: +----------+--------+--------+--------+------------------+--------+           PSV cm/sEDV cm/sStenosisPlaque DescriptionComments +----------+--------+--------+--------+------------------+--------+ CCA Prox  49      13              heterogenous                +----------+--------+--------+--------+------------------+--------+ CCA Distal53      15              heterogenous               +----------+--------+--------+--------+------------------+--------+ ICA Prox  102     34      1-39%   heterogenous               +----------+--------+--------+--------+------------------+--------+ ICA Distal93      22                                         +----------+--------+--------+--------+------------------+--------+ ECA       118     17                                         +----------+--------+--------+--------+------------------+--------+ +----------+--------+--------+--------+-------------------+  PSV cm/sEDV cm/sDescribeArm Pressure (mmHG) +----------+--------+--------+--------+-------------------+ [id number]                                         +----------+--------+--------+--------+-------------------+ +---------+--------+--+--------+--+---------+ VertebralPSV cm/s99EDV cm/s29Antegrade +---------+--------+--+--------+--+---------+   Summary: Right Carotid: Velocities in the right ICA are consistent with a 1-39% stenosis. Left Carotid: Velocities in the left ICA are consistent with a 1-39% stenosis. Vertebrals: Bilateral vertebral arteries demonstrate antegrade flow. *See table(s) above for measurements and observations.  Electronically signed by Delia Heady MD on 03/20/2021 at 11:16:42 AM.    Final    (Echo, Carotid, EGD, Colonoscopy, ERCP)    Subjective: No complaints  Discharge Exam: Vitals:   03/20/21 0732 03/20/21 1108  BP: 140/77 136/62  Pulse: 88 91  Resp: 14 14  Temp: 97.7 F (36.5 C) 97.8 F (36.6 C)  SpO2:  97%   Vitals:   03/20/21 0033 03/20/21 0300 03/20/21 0732 03/20/21 1108  BP: (!) 139/98 130/83 140/77 136/62  Pulse: 98 95 88 91  Resp: 16 17 14 14   Temp:   97.7 F (36.5 C) 97.8 F (36.6 C)  TempSrc:   Oral Oral  SpO2: 94% 98%  97%    General: Pt is alert,  awake, not in acute distress Cardiovascular: RRR, S1/S2 +, no rubs, no gallops Respiratory: CTA bilaterally, no wheezing, no rhonchi Abdominal: Soft, NT, ND, bowel sounds + Extremities: no edema, no cyanosis    The results of significant diagnostics from this hospitalization (including imaging, microbiology, ancillary and laboratory) are listed below for reference.     Microbiology: Recent Results (from the past 240 hour(s))  Resp Panel by RT-PCR (Flu A&B, Covid) Nasopharyngeal Swab     Status: None   Collection Time: 03/13/21  6:06 PM   Specimen: Nasopharyngeal Swab; Nasopharyngeal(NP) swabs in vial transport medium  Result Value Ref Range Status   SARS Coronavirus 2 by RT PCR NEGATIVE NEGATIVE Final    Comment: (NOTE) SARS-CoV-2 target nucleic acids are NOT DETECTED.  The SARS-CoV-2 RNA is generally detectable in upper respiratory specimens during the acute phase of infection. The lowest concentration of SARS-CoV-2 viral copies this assay can detect is 138 copies/mL. A negative result does not preclude SARS-Cov-2 infection and should not be used as the sole basis for treatment or other patient management decisions. A negative result may occur with  improper specimen collection/handling, submission of specimen other than nasopharyngeal swab, presence of viral mutation(s) within the areas targeted by this assay, and inadequate number of viral copies(<138 copies/mL). A negative result must be combined with clinical observations, patient history, and epidemiological information. The expected result is Negative.  Fact Sheet for Patients:  03/15/21  Fact Sheet for Healthcare Providers:  BloggerCourse.com  This test is no t yet approved or cleared by the [place] FDA and  has been authorized for detection and/or diagnosis of SARS-CoV-2 by FDA under an Emergency Use Authorization (EUA). This EUA will remain  in effect  (meaning this test can be used) for the duration of the COVID-19 declaration under Section 564(b)(1) of the Act, 21 U.S.C.section 360bbb-3(b)(1), unless the authorization is terminated  or revoked sooner.       Influenza A by PCR NEGATIVE NEGATIVE Final   Influenza B by PCR NEGATIVE NEGATIVE Final    Comment: (NOTE) The Xpert Xpress SARS-CoV-2/FLU/RSV plus assay is intended as an aid in the diagnosis of influenza from Nasopharyngeal swab specimens  and should not be used as a sole basis for treatment. Nasal washings and aspirates are unacceptable for Xpert Xpress SARS-CoV-2/FLU/RSV testing.  Fact Sheet for Patients: BloggerCourse.com  Fact Sheet for Healthcare Providers: SeriousBroker.it  This test is not yet approved or cleared by the [place] FDA and has been authorized for detection and/or diagnosis of SARS-CoV-2 by FDA under an Emergency Use Authorization (EUA). This EUA will remain in effect (meaning this test can be used) for the duration of the COVID-19 declaration under Section 564(b)(1) of the Act, 21 U.S.C. section 360bbb-3(b)(1), unless the authorization is terminated or revoked.  Performed at [facility], [address] [place], [place] [address]      Labs: BNP (last 3 results) No results for input(s): BNP in the last 8760 hours. Basic Metabolic Panel: Recent Labs  Lab 03/13/21 1806 03/14/21 0401 03/17/21 0901  NA 134* 139 135  K 3.6 3.6 3.2*  CL 97* 103 98  CO2 27 27 27   GLUCOSE 127* 112* 124*  BUN 38* 43* 16  CREATININE 1.81* 1.65* 1.11*  CALCIUM 9.3 8.7* 9.1  MG 1.9  --   --    Liver Function Tests: Recent Labs  Lab 03/13/21 1806  AST 73*  ALT 36  ALKPHOS 87  BILITOT 1.3*  PROT 7.2  ALBUMIN 4.0   No results for input(s): LIPASE, AMYLASE in the last 168 hours. No results for input(s): AMMONIA in the last 168 hours. CBC: Recent Labs  Lab 03/13/21 1806 03/14/21 0401  03/14/21 1021  WBC 24.2* 15.3* 13.8*  NEUTROABS 20.1* 11.4*  --   HGB 12.5 10.6* 11.2*  HCT 36.8 31.5* 32.6*  MCV 93.6 95.2 95.6  PLT 428* 296 316   Cardiac Enzymes: Recent Labs  Lab 03/13/21 1806 03/14/21 0401  CKTOTAL 1,390* 817*   BNP: Invalid input(s): POCBNP CBG: Recent Labs  Lab 03/14/21 1310  GLUCAP 102*   D-Dimer No results for input(s): DDIMER in the last 72 hours. Hgb A1c No results for input(s): HGBA1C in the last 72 hours. Lipid Profile No results for input(s): CHOL, HDL, LDLCALC, TRIG, CHOLHDL, LDLDIRECT in the last 72 hours. Thyroid function studies No results for input(s): TSH, T4TOTAL, T3FREE, THYROIDAB in the last 72 hours.  Invalid input(s): FREET3 Anemia work up No results for input(s): VITAMINB12, FOLATE, FERRITIN, TIBC, IRON, RETICCTPCT in the last 72 hours. Urinalysis    Component Value Date/Time   COLORURINE AMBER (A) 03/14/2021 0410   APPEARANCEUR HAZY (A) 03/14/2021 0410   APPEARANCEUR Hazy 06/13/2013 1729   LABSPEC 1.026 03/14/2021 0410   LABSPEC 1.013 06/13/2013 1729   PHURINE 5.0 03/14/2021 0410   GLUCOSEU NEGATIVE 03/14/2021 0410   GLUCOSEU Negative 06/13/2013 1729   HGBUR NEGATIVE 03/14/2021 0410   BILIRUBINUR SMALL (A) 03/14/2021 0410   BILIRUBINUR Negative 06/13/2013 1729   KETONESUR 5 (A) 03/14/2021 0410   PROTEINUR 30 (A) 03/14/2021 0410   NITRITE NEGATIVE 03/14/2021 0410   LEUKOCYTESUR NEGATIVE 03/14/2021 0410   LEUKOCYTESUR Trace 06/13/2013 1729   Sepsis Labs Invalid input(s): PROCALCITONIN,  WBC,  LACTICIDVEN Microbiology Recent Results (from the past 240 hour(s))  Resp Panel by RT-PCR (Flu A&B, Covid) Nasopharyngeal Swab     Status: None   Collection Time: 03/13/21  6:06 PM   Specimen: Nasopharyngeal Swab; Nasopharyngeal(NP) swabs in vial transport medium  Result Value Ref Range Status   SARS Coronavirus 2 by RT PCR NEGATIVE NEGATIVE Final    Comment: (NOTE) SARS-CoV-2 target nucleic acids are NOT DETECTED.  The  SARS-CoV-2 RNA is generally detectable in upper respiratory specimens during the acute phase of infection. The lowest concentration of SARS-CoV-2 viral copies this assay can detect is 138 copies/mL. A negative result does not preclude SARS-Cov-2 infection and should not be used as the sole basis for treatment or other patient management decisions. A negative result may occur with  improper specimen collection/handling, submission of specimen other than nasopharyngeal swab, presence of viral mutation(s) within the areas targeted by this assay, and inadequate number of viral copies(<138 copies/mL). A negative result must be combined with clinical observations, patient history, and epidemiological information. The expected result is Negative.  Fact Sheet for Patients:  BloggerCourse.com  Fact Sheet for Healthcare Providers:  SeriousBroker.it  This test is no t yet approved or cleared by the [place] FDA and  has been authorized for detection and/or diagnosis of SARS-CoV-2 by FDA under an Emergency Use Authorization (EUA). This EUA will remain  in effect (meaning this test can be used) for the duration of the COVID-19 declaration under Section 564(b)(1) of the Act, 21 U.S.C.section 360bbb-3(b)(1), unless the authorization is terminated  or revoked sooner.       Influenza A by PCR NEGATIVE NEGATIVE Final   Influenza B by PCR NEGATIVE NEGATIVE Final    Comment: (NOTE) The Xpert Xpress SARS-CoV-2/FLU/RSV plus assay is intended as an aid in the diagnosis of influenza from Nasopharyngeal swab specimens and should not be used as a sole basis for treatment. Nasal washings and aspirates are unacceptable for Xpert Xpress SARS-CoV-2/FLU/RSV testing.  Fact Sheet for Patients: BloggerCourse.com  Fact Sheet for Healthcare Providers: SeriousBroker.it  This test is not yet approved or  cleared by the [place] FDA and has been authorized for detection and/or diagnosis of SARS-CoV-2 by FDA under an Emergency Use Authorization (EUA). This EUA will remain in effect (meaning this test can be used) for the duration of the COVID-19 declaration under Section 564(b)(1) of the Act, 21 U.S.C. section 360bbb-3(b)(1), unless the authorization is terminated or revoked.  Performed at [facility], [address] [place] [place], [place] [address]     SIGNED:   Marinda Elk, MD  [facility] 03/20/2021, 12:06 PM Pager   If 7PM-7AM, please contact night-coverage [email or web address] Password [email or web address]

## 2021-03-21 LAB — SARS CORONAVIRUS 2 (TAT 6-24 HRS): SARS Coronavirus 2: NEGATIVE

## 2021-03-21 MED ORDER — CLONAZEPAM 0.25 MG PO TBDP
0.2500 mg | ORAL_TABLET | Freq: Two times a day (BID) | ORAL | Status: DC | PRN
  Administered 2021-03-21 (×2): 0.25 mg via ORAL
  Filled 2021-03-21 (×2): qty 1

## 2021-03-21 MED ORDER — COVID-19 MRNA VACC (MODERNA) 50 MCG/0.25ML IM SUSP
0.2500 mL | Freq: Once | INTRAMUSCULAR | Status: AC
  Administered 2021-03-21: 0.25 mL via INTRAMUSCULAR
  Filled 2021-03-21: qty 0.25

## 2021-03-21 NOTE — Progress Notes (Signed)
Report called to Selena Batten, nurse at [facility].

## 2021-03-21 NOTE — TOC Transition Note (Signed)
Transition of Care [facility]) - CM/SW Discharge Note   Patient Details  Name: Jenna Powers MRN: [id number] Date of Birth: 05-13-1941  [facility]) CM/SW Contact:  Baldemar Lenis, LCSW Phone Number: 03/21/2021, 1:29 PM   Clinical Narrative:   Nurse to call report to [phone].  [facility] to transport after COVID test results negative.    Final next level of care: Skilled Nursing Facility Barriers to Discharge: Barriers Resolved   Patient Goals and CMS Choice Patient states their goals for this hospitalization and ongoing recovery are:: Rehab CMS Medicare.gov Compare Post Acute Care list provided to:: Patient Choice offered to / list presented to : Patient  Discharge Placement              Patient chooses bed at: [facility] Patient to be transferred to facility by: [facility] Name of family member notified: Self Patient and family notified of of transfer: 03/21/21  Discharge Plan and Services In-house Referral: Clinical Social Work   Post Acute Care Choice: Skilled Nursing Facility                               Social Determinants of Health (SDOH) Interventions     Readmission Risk Interventions No flowsheet data found.

## 2021-03-21 NOTE — Progress Notes (Signed)
Occupational Therapy Treatment Patient Details Name: Jenna Powers MRN: [id number] DOB: 10-24-1940 Today's Date: 03/21/2021    History of present illness 80 yo female presented to ED 03/13/21 after being found on the floor with R-sided weakness. Pt is a transfer from [facility]. CT scan of the brain shows an acute intraparenchymal hemorrhage within the medial high right frontal lobe with adjacent edema as well as a left to right midline shift. MRI: 8 mm acute hemorrhage adjacent to R lateral ventricle, 2.8 cm hemorrhage L posterior frontal vertex, 3-4 MM focal hemorhages R frontoparietal and L posterior frontal vertex, L subdural effusion with left-to-right shift of 3 mm.  PMH OA depression HLD HTN neuropathy thyroid disease tremor   OT comments  Pt progressing towards OT goals and motivated to participate in session, however, benefiting from encouragement throughout due to emotional lability this session. Pt performing SPT to recliner with Max A+2. Pt performing oral care with Mod A to manage and apply toothpaste. Pt continues to present with decreased strength, balance, problem solving, safety, awareness, and ability to follow commands. Recommend discharge to SNF and will continue to follow acutely to optimize independence in ADLs.    Follow Up Recommendations  SNF    Equipment Recommendations  3 in 1 bedside commode;Wheelchair (measurements OT);Wheelchair cushion (measurements OT)    Recommendations for Other Services      Precautions / Restrictions Precautions Precautions: Fall;Other (comment) Restrictions Weight Bearing Restrictions: No       Mobility Bed Mobility Overal bed mobility: Needs Assistance Bed Mobility: Supine to Sit     Supine to sit: HOB elevated;Max assist     General bed mobility comments: Max a to bring BLE off of bed and for trunk elevation    Transfers Overall transfer level: Needs assistance Equipment used: 2 person hand held assist Transfers: Stand  Pivot Transfers   Stand pivot transfers: +2 physical assistance;+2 safety/equipment;Max assist       General transfer comment: pivot transfer toward L bed to recliner. Once in recliner sit to stand with +2 HHA, R knee blocked. Facilitation at hips to come up to full stance. Unable to sustain more than a few seconds.    Balance Overall balance assessment: Needs assistance Sitting-balance support: Single extremity supported;Feet supported Sitting balance-Leahy Scale: Poor Sitting balance - Comments: forward, flexed posture. Assist to maintain balance   Standing balance support: Bilateral upper extremity supported;During functional activity Standing balance-Leahy Scale: Poor Standing balance comment: heavy reliance on external support         ADL either performed or assessed with clinical judgement   ADL Overall ADL's : Needs assistance/impaired     Grooming: Moderate assistance;Oral care;Sitting Grooming Details (indicate cue type and reason): Requiring mod A to open and apply toothpaste to toothbrush.   Toilet Transfer: Maximal assistance;+2 for physical assistance;+2 for safety/equipment;Stand-pivot;BSC Toilet Transfer Details (indicate cue type and reason): simulated SPT toilet transfer to recliner.   Functional mobility during ADLs: Maximal assistance;+2 for physical assistance;+2 for safety/equipment (SPT) General ADL Comments: Pt requiring mod A for BUE grooming tasks. Pt performing SPT with Mod A +2     Vision   Vision Assessment?: No apparent visual deficits          Cognition Arousal/Alertness: Awake/alert Behavior During Therapy: WFL for tasks assessed/performed;Flat affect Overall Cognitive Status: No family/caregiver present to determine baseline cognitive functioning Area of Impairment: Attention;Memory;Following commands;Safety/judgement;Awareness;Problem solving   Current Attention Level: Sustained Memory: Decreased short-term memory Following Commands:  Follows one step commands with  increased time Safety/Judgement: Decreased awareness of safety;Decreased awareness of deficits Awareness: Emergent Problem Solving: Slow processing;Decreased initiation;Difficulty sequencing;Requires verbal cues;Requires tactile cues General Comments: Pt reporting she has anxiety and would like to have medication prior to sessions in the future. Pt with decreased short term memory and fluctuations in mood throughout session. Pt with decreased ability to problem solve during oral care and requiring assistance despite verbal cues to incorporate use of BUE.              General Comments Pt with emotional lability this session, benefitting from encouragement.    Pertinent Vitals/ Pain       Pain Assessment: No/denies pain   Frequency  Min 2X/week        Progress Toward Goals  OT Goals(current goals can now be found in the care plan section)  Progress towards OT goals: Progressing toward goals  Acute Rehab OT Goals Patient Stated Goal: to walk again OT Goal Formulation: With patient Time For Goal Achievement: 03/29/21 Potential to Achieve Goals: Good ADL Goals Pt Will Perform Grooming: with set-up;sitting Pt Will Transfer to Toilet: with min assist;bedside commode Pt/caregiver will Perform Home Exercise Program: Increased strength;With minimal assist;Right Upper extremity;With written HEP provided Additional ADL Goal #1: Pt will attend to tasks on R side 75 % of the time with minimal cues. Additional ADL Goal #2: pt will state and use 3 ADL items for intended purpose in 3/4 trials with minimal cues fro proper sequencing.  Plan Discharge plan needs to be updated       AM-PAC OT "6 Clicks" Daily Activity     Outcome Measure   Help from another person eating meals?: A Lot Help from another person taking care of personal grooming?: A Lot Help from another person toileting, which includes using toliet, bedpan, or urinal?: Total Help from another  person bathing (including washing, rinsing, drying)?: A Lot Help from another person to put on and taking off regular upper body clothing?: A Lot Help from another person to put on and taking off regular lower body clothing?: A Lot 6 Click Score: 11    End of Session Equipment Utilized During Treatment: Gait belt  OT Visit Diagnosis: Unsteadiness on feet (R26.81);Muscle weakness (generalized) (M62.81);Other symptoms and signs involving cognitive function   Activity Tolerance Patient tolerated treatment well   Patient Left in chair;with call bell/phone within reach;with chair alarm set   Nurse Communication Mobility status        Time: 3825-0539 OT Time Calculation (min): 32 min  Charges: OT General Charges $OT Visit: 1 Visit OT Treatments $Self Care/Home Management : 8-22 mins $Neuromuscular Re-education: 8-22 mins  Ladene Artist, OTDS    Ladene Artist 03/21/2021, 4:26 PM

## 2021-03-21 NOTE — TOC Progression Note (Signed)
Transition of Care [facility]) - Progression Note    Patient Details  Name: Jenna Powers MRN: [id number] Date of Birth: 03-Jan-1941  [facility]) CM/SW Contact  Baldemar Lenis, [place] Phone Number: 03/21/2021, 11:01 AM  Clinical Narrative:   CSW contacted by [facility] that Monia Pouch is asking for updated clinicals. CSW sent updated MD and PT note back to [facility] so they can fax in to [place] for authorization. Insurance auth still pending.    Expected Discharge Plan: Skilled Nursing Facility Barriers to Discharge: English as a second language teacher, SNF Pending bed offer  Expected Discharge Plan and Services Expected Discharge Plan: Skilled Nursing Facility In-house Referral: Clinical Social Work   Post Acute Care Choice: Skilled Nursing Facility Living arrangements for the past 2 months: Single Family Home Expected Discharge Date: 03/20/21                                     Social Determinants of Health (SDOH) Interventions    Readmission Risk Interventions No flowsheet data found.

## 2021-03-21 NOTE — Progress Notes (Signed)
[facility] PROGRESS NOTE    Progress Note  Jenna Powers  TTS:[id number] DOB: 20-Oct-1940 DOA: 03/14/2021 PCP: Jenna Kern, MD     Brief Narrative:   Jenna Powers is an 80 y.o. female past medical history significant for depression, history of CVA essential hypertension brought in to the ED after EMS found her on the floor on the right side covered in urine and feces CT of the head without contrast shows small bleed in the right frontal and periventricular area suspicious for metastatic process question traumatic.  Neurology was consulted.  Patient is stable to be transferred to skilled nursing facility, awaiting insurance authorization.  Assessment/Plan:   Intraparenchymal hemorrhage of brain: MRI of the brain showed 8 mm acute hemorrhage and a 2.8 acute hemorrhage of the left posterior frontal vertex with mild surrounding edema. Stable, neuro has no further recommendations..  Rhabdomyolysis nontraumatic: Resolved with IV fluid hydration , KVO IV fluids.  Acute kidney injury: Baseline creatinine around 1 on admission 1.8 in the setting of ARB use rhabdomyolysis and decreased oral intake. Resolved her creatinine has returned to baseline.  Leukocytosis: Likely due to intracranial bleed, slowly trending down. Has remained afebrile continue to monitor off antibiotics.  Elevated troponins: Denies any chest pain likely due to intracranial bleed.  Suicidal thoughts with plan: Psych is cleared the patient on suicidal thoughts.   Stage I  ulcer present on admission on the right buttock RN Pressure Injury Documentation: Pressure Injury 03/14/21 Buttocks Right Deep Tissue Pressure Injury - Purple or maroon localized area of discolored intact skin or blood-filled blister due to damage of underlying soft tissue from pressure and/or shear. (Active)  03/14/21 1500  Location: Buttocks  Location Orientation: Right  Staging: Deep Tissue Pressure Injury - Purple or maroon  localized area of discolored intact skin or blood-filled blister due to damage of underlying soft tissue from pressure and/or shear.  Wound Description (Comments):   Present on Admission: Yes    Estimated body mass index is 20.18 kg/m as calculated from the following:   Height as of 03/13/21: 5\' 6"  (1.676 m).   Weight as of 03/13/21: 56.7 kg.   DVT prophylaxis: none Family Communication:none Status is: Inpatient  Remains inpatient appropriate because:Hemodynamically unstable  Dispo: The patient is from: Home              Anticipated d/c is to: SNF              Patient currently is not medically stable to d/c.   Difficult to place patient No   Code Status:     Code Status Orders  (From admission, onward)           Start     Ordered   03/14/21 1656  Full code  Continuous        03/14/21 1656           Code Status History     Date Active Date Inactive Code Status Order ID Comments User Context   03/14/2021 0926 03/14/2021 1420 Full Code 03/16/2021  [id number], MD ED   03/14/2021 0639 03/14/2021 0926 Full Code 03/16/2021  [id number], MD ED   09/10/2019 1450 09/11/2019 2140 Full Code 09/13/2019  [id number], MD ED         IV Access:   Peripheral IV   Procedures and diagnostic studies:   No results found.   Medical Consultants:   None.   Subjective:    Jenna Powers relates  her pain is controlled  Objective:    Vitals:   03/21/21 0036 03/21/21 0354 03/21/21 0753 03/21/21 1132  BP: 136/64 (!) 154/92 (!) 152/82 (!) 162/78  Pulse: 85 94 91 89  Resp: 13 14 20 16   Temp: 99 F (37.2 C) 98.1 F (36.7 C) 97.7 F (36.5 C) 98.2 F (36.8 C)  TempSrc: Oral Oral Oral Oral  SpO2: 97% 97% 95% 99%   SpO2: 99 %   Intake/Output Summary (Last 24 hours) at 03/21/2021 1137 Last data filed at 03/21/2021 0955 Gross per 24 hour  Intake 600 ml  Output 2050 ml  Net -1450 ml    There were no vitals filed for this visit.  Exam: General exam: In no  acute distress. Respiratory system: Good air movement and clear to auscultation. Cardiovascular system: S1 & S2 heard, RRR. No JVD. Gastrointestinal system: Abdomen is nondistended, soft and nontender.  Extremities: No pedal edema. Skin: No rashes, lesions or ulcers  Data Reviewed:    Labs: Basic Metabolic Panel: Recent Labs  Lab 03/17/21 0901  NA 135  K 3.2*  CL 98  CO2 27  GLUCOSE 124*  BUN 16  CREATININE 1.11*  CALCIUM 9.1    GFR Estimated Creatinine Clearance: 35 mL/min (A) (by C-G formula based on SCr of 1.11 mg/dL (H)). Liver Function Tests: No results for input(s): AST, ALT, ALKPHOS, BILITOT, PROT, ALBUMIN in the last 168 hours.  No results for input(s): LIPASE, AMYLASE in the last 168 hours. No results for input(s): AMMONIA in the last 168 hours. Coagulation profile No results for input(s): INR, PROTIME in the last 168 hours.  COVID-19 Labs  No results for input(s): DDIMER, FERRITIN, LDH, CRP in the last 72 hours.  Lab Results  Component Value Date   SARSCOV2NAA NEGATIVE 03/13/2021   SARSCOV2NAA NEGATIVE 09/10/2019    CBC: No results for input(s): WBC, NEUTROABS, HGB, HCT, MCV, PLT in the last 168 hours.  Cardiac Enzymes: No results for input(s): CKTOTAL, CKMB, CKMBINDEX, TROPONINI in the last 168 hours.  BNP (last 3 results) No results for input(s): PROBNP in the last 8760 hours. CBG: Recent Labs  Lab 03/14/21 1310  GLUCAP 102*    D-Dimer: No results for input(s): DDIMER in the last 72 hours. Hgb A1c: No results for input(s): HGBA1C in the last 72 hours.  Lipid Profile: No results for input(s): CHOL, HDL, LDLCALC, TRIG, CHOLHDL, LDLDIRECT in the last 72 hours.  Thyroid function studies: No results for input(s): TSH, T4TOTAL, T3FREE, THYROIDAB in the last 72 hours.  Invalid input(s): FREET3 Anemia work up: No results for input(s): VITAMINB12, FOLATE, FERRITIN, TIBC, IRON, RETICCTPCT in the last 72 hours. Sepsis Labs: No results for  input(s): PROCALCITON, WBC, LATICACIDVEN in the last 168 hours.  Microbiology Recent Results (from the past 240 hour(s))  Resp Panel by RT-PCR (Flu A&B, Covid) Nasopharyngeal Swab     Status: None   Collection Time: 03/13/21  6:06 PM   Specimen: Nasopharyngeal Swab; Nasopharyngeal(NP) swabs in vial transport medium  Result Value Ref Range Status   SARS Coronavirus 2 by RT PCR NEGATIVE NEGATIVE Final    Comment: (NOTE) SARS-CoV-2 target nucleic acids are NOT DETECTED.  The SARS-CoV-2 RNA is generally detectable in upper respiratory specimens during the acute phase of infection. The lowest concentration of SARS-CoV-2 viral copies this assay can detect is 138 copies/mL. A negative result does not preclude SARS-Cov-2 infection and should not be used as the sole basis for treatment or other patient management decisions. A negative  result may occur with  improper specimen collection/handling, submission of specimen other than nasopharyngeal swab, presence of viral mutation(s) within the areas targeted by this assay, and inadequate number of viral copies(<138 copies/mL). A negative result must be combined with clinical observations, patient history, and epidemiological information. The expected result is Negative.  Fact Sheet for Patients:  BloggerCourse.com  Fact Sheet for Healthcare Providers:  SeriousBroker.it  This test is no t yet approved or cleared by the [place] FDA and  has been authorized for detection and/or diagnosis of SARS-CoV-2 by FDA under an Emergency Use Authorization (EUA). This EUA will remain  in effect (meaning this test can be used) for the duration of the COVID-19 declaration under Section 564(b)(1) of the Act, 21 U.S.C.section 360bbb-3(b)(1), unless the authorization is terminated  or revoked sooner.       Influenza A by PCR NEGATIVE NEGATIVE Final   Influenza B by PCR NEGATIVE NEGATIVE Final     Comment: (NOTE) The Xpert Xpress SARS-CoV-2/FLU/RSV plus assay is intended as an aid in the diagnosis of influenza from Nasopharyngeal swab specimens and should not be used as a sole basis for treatment. Nasal washings and aspirates are unacceptable for Xpert Xpress SARS-CoV-2/FLU/RSV testing.  Fact Sheet for Patients: BloggerCourse.com  Fact Sheet for Healthcare Providers: SeriousBroker.it  This test is not yet approved or cleared by the [place] FDA and has been authorized for detection and/or diagnosis of SARS-CoV-2 by FDA under an Emergency Use Authorization (EUA). This EUA will remain in effect (meaning this test can be used) for the duration of the COVID-19 declaration under Section 564(b)(1) of the Act, 21 U.S.C. section 360bbb-3(b)(1), unless the authorization is terminated or revoked.  Performed at [facility], [address] [place], [place] [address]      Medications:    atorvastatin  20 mg Oral Daily   heparin injection (subcutaneous)  5,000 Units Subcutaneous Q8H   levothyroxine  25 mcg Oral q morning   lisinopril  20 mg Oral BID   melatonin  3 mg Oral QHS   sertraline  100 mg Oral Daily   traZODone  200 mg Oral QHS   Continuous Infusions:     LOS: 7 days   Marinda Elk  [facility]  03/21/2021, 11:37 AM

## 2021-03-21 NOTE — Plan of Care (Signed)
Patient is alert oriented x 4. Pt has right sided weakness. Pt c/o pain to right leg, related to fall she had a home. Pt is a Q2 turn. No distress noted. Pt has not verbalized suicidal thoughts but verbalizes she is frustrated and sad that she can no longer move around like she used to.  Needs met, prn pain medication given, anxiety medication given with effective results. Pt rested with eyes closed for most of the night. Pt had complete bath.    Problem: Education: Goal: Knowledge of disease or condition will improve Outcome: Progressing Goal: Knowledge of secondary prevention will improve Outcome: Progressing Goal: Knowledge of patient specific risk factors addressed and post discharge goals established will improve Outcome: Progressing   Problem: Self-Care: Goal: Ability to participate in self-care as condition permits will improve Outcome: Progressing   Problem: Intracerebral Hemorrhage Tissue Perfusion: Goal: Complications of Intracerebral Hemorrhage will be minimized Outcome: Progressing   Problem: Spontaneous Subarachnoid Hemorrhage Tissue Perfusion: Goal: Complications of Spontaneous Subarachnoid Hemorrhage will be minimized Outcome: Progressing   Problem: Activity: Goal: Risk for activity intolerance will decrease Outcome: Progressing   Problem: Elimination: Goal: Will not experience complications related to bowel motility Outcome: Progressing Goal: Will not experience complications related to urinary retention Outcome: Progressing   Problem: Pain Managment: Goal: General experience of comfort will improve Outcome: Progressing

## 2021-04-18 ENCOUNTER — Encounter: Payer: Self-pay | Admitting: *Deleted

## 2021-04-18 ENCOUNTER — Other Ambulatory Visit: Payer: Self-pay

## 2021-04-18 ENCOUNTER — Emergency Department
Admission: EM | Admit: 2021-04-18 | Discharge: 2021-04-18 | Disposition: A | Discharge status: Home / Self Care | Payer: Medicare HMO | Attending: Emergency Medicine | Admitting: Emergency Medicine

## 2021-04-18 ENCOUNTER — Emergency Department: Payer: Medicare HMO

## 2021-04-18 DIAGNOSIS — E039 Hypothyroidism, unspecified: Secondary | ICD-10-CM | POA: Diagnosis not present

## 2021-04-18 DIAGNOSIS — W182XXA Fall in (into) shower or empty bathtub, initial encounter: Secondary | ICD-10-CM | POA: Diagnosis not present

## 2021-04-18 DIAGNOSIS — S59911A Unspecified injury of right forearm, initial encounter: Secondary | ICD-10-CM | POA: Diagnosis present

## 2021-04-18 DIAGNOSIS — S51811A Laceration without foreign body of right forearm, initial encounter: Secondary | ICD-10-CM | POA: Diagnosis not present

## 2021-04-18 DIAGNOSIS — M25571 Pain in right ankle and joints of right foot: Secondary | ICD-10-CM | POA: Diagnosis not present

## 2021-04-18 DIAGNOSIS — I129 Hypertensive chronic kidney disease with stage 1 through stage 4 chronic kidney disease, or unspecified chronic kidney disease: Secondary | ICD-10-CM | POA: Insufficient documentation

## 2021-04-18 DIAGNOSIS — Z79899 Other long term (current) drug therapy: Secondary | ICD-10-CM | POA: Insufficient documentation

## 2021-04-18 DIAGNOSIS — N1831 Chronic kidney disease, stage 3a: Secondary | ICD-10-CM | POA: Insufficient documentation

## 2021-04-18 DIAGNOSIS — Y92002 Bathroom of unspecified non-institutional (private) residence single-family (private) house as the place of occurrence of the external cause: Secondary | ICD-10-CM | POA: Diagnosis not present

## 2021-04-18 DIAGNOSIS — W19XXXA Unspecified fall, initial encounter: Secondary | ICD-10-CM

## 2021-04-18 DIAGNOSIS — Z87891 Personal history of nicotine dependence: Secondary | ICD-10-CM | POA: Diagnosis not present

## 2021-04-18 DIAGNOSIS — S0990XA Unspecified injury of head, initial encounter: Secondary | ICD-10-CM | POA: Diagnosis not present

## 2021-04-18 LAB — BASIC METABOLIC PANEL
Anion gap: 9 (ref 5–15)
BUN: 19 mg/dL (ref 8–23)
CO2: 30 mmol/L (ref 22–32)
Calcium: 9.8 mg/dL (ref 8.9–10.3)
Chloride: 103 mmol/L (ref 98–111)
Creatinine, Ser: 1.14 mg/dL — ABNORMAL HIGH (ref 0.44–1.00)
GFR, Estimated: 48 mL/min — ABNORMAL LOW (ref >60)
Glucose, Bld: 102 mg/dL — ABNORMAL HIGH (ref 70–99)
Potassium: 4.4 mmol/L (ref 3.5–5.1)
Sodium: 142 mmol/L (ref 135–145)

## 2021-04-18 LAB — CBC
HCT: 40 % (ref 36.0–46.0)
Hemoglobin: 13.4 g/dL (ref 12.0–15.0)
MCH: 32.1 pg (ref 26.0–34.0)
MCHC: 33.5 g/dL (ref 30.0–36.0)
MCV: 95.9 fL (ref 80.0–100.0)
Platelets: 338 10*3/uL (ref 150–400)
RBC: 4.17 MIL/uL (ref 3.87–5.11)
RDW: 13.2 % (ref 11.5–15.5)
WBC: 8.1 10*3/uL (ref 4.0–10.5)
nRBC: 0 % (ref 0.0–0.2)

## 2021-04-18 LAB — TROPONIN I (HIGH SENSITIVITY)
Troponin I (High Sensitivity): 15 ng/L (ref <18)
Troponin I (High Sensitivity): 15 ng/L (ref <18)

## 2021-04-18 NOTE — ED Notes (Signed)
Helped pt to [facility] to void. Pt now going to CT.

## 2021-04-18 NOTE — ED Notes (Signed)
[facility] [phone]; clerk to arrange transport via EMS

## 2021-04-18 NOTE — ED Notes (Signed)
Son: Glori Bickers called @[phone]; voicemail left

## 2021-04-18 NOTE — ED Notes (Signed)
Called EMS for transport back to [facility] 

## 2021-04-18 NOTE — ED Triage Notes (Addendum)
Pt brought in via ems from the [facility].  Pt fell today in the bathroom striking right arm on the sink.  Pt was using a wheelchair when she fell leaving the toilet.  Pt had a stroke 4 weeks ago, sent there for rehab.  Pt reports dizziness.   Pt alert  speech clear.  No loc.  No head injury .

## 2021-04-18 NOTE — ED Notes (Signed)
Contacted [facility] @[phone] ; Spoke with [phone] for report to transfer patient upon discharge

## 2021-04-18 NOTE — ED Provider Notes (Signed)
[facility]-EMERGENCY DEPARTMENT  ____________________________________________  Time seen: Approximately 6:56 PM  I have reviewed the triage vital signs and the nursing notes.   HISTORY  Chief Complaint Abrasion and Fall   Historian     HPI Jenna Powers is a 80 y.o. female presents to the emergency department after a mechanical fall.  Patient resides at the [facility] assisted living facility.  Patient states that she was admitted on 726 for a CVA and has been in a skilled nursing facility for rehabilitation.  She states that she experienced some dizziness today and slid against the counter in her bathroom causing some right arm pain and a 3 cm x 2 cm skin tear along forearm.  She denies current headache, weakness in the upper extremities, chest pain, chest tightness or abdominal pain.  She does state that she has persistent right ankle pain that she has had chronically for several years but has seemed worse since her CVA.  Her tetanus status is up-to-date.   Past Medical History:  Diagnosis Date   Depression    Hyperlipemia    Hypertension    Neuropathy    Stroke [facility])    Thyroid disease    Tremor      Immunizations up to date:  Yes.     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 Authorizing Provider  acetaminophen (TYLENOL) 325 MG tablet Take 650 mg by mouth every 6 (six) hours as needed for moderate pain.    [provider]  atorvastatin (LIPITOR) 20 MG tablet Take 1 tablet (20 mg total) by mouth daily. 03/21/21   Marinda Elk, MD  Cholecalciferol (VITAMIN D3 PO) Take 1 tablet by mouth daily.    [provider]  levothyroxine (SYNTHROID) 25 MCG tablet Take 25 mcg by mouth every morning. 07/31/19   [provider]  lisinopril (ZESTRIL) 40 MG tablet Take 40 mg by mouth daily. 07/23/19   [provider]  Multiple Vitamin (MULTIVITAMIN WITH MINERALS) TABS tablet Take 1 tablet by mouth daily.    [provider]  Multiple Vitamins-Minerals (ZINC PO) Take 1 tablet by mouth daily.    [provider]  propranolol ER (INDERAL LA) 80 MG 24 hr capsule Hold this medication because your heart rate is already low in 50's without this. 09/11/19   Darlin Priestly, MD  sertraline (ZOLOFT) 100 MG tablet Take 100 mg by mouth daily. 08/27/19   [provider]  Spacer/Aero-Holding Chambers (AEROCHAMBER PLUS) inhaler Use as instructed 10/28/19   Domenick Gong, MD  traMADol (ULTRAM) 50 MG tablet Take 50 mg by mouth 3 (three) times daily as  needed for moderate pain. 02/07/21   [provider]  traZODone (DESYREL) 100 MG tablet Take 200 mg by mouth at bedtime as needed for sleep. 01/17/21   [provider]    Allergies Patient has no known allergies.  Family History  Problem Relation Age of Onset   Pneumonia Mother    Hypertension Father    Heart disease Father    Stroke Father     Social History Social History   Tobacco Use   Smoking status: Former    Packs/day: 0.15    Types: Cigarettes    Quit date: 07/2019    Years since quitting: 1.7   Smokeless tobacco: Never  Vaping Use   Vaping Use: Never  used  Substance Use Topics   Alcohol use: Not Currently   Drug use: No     Review of Systems  Constitutional: No fever/chills Eyes:  No discharge ENT: No upper respiratory complaints. Respiratory: no cough. No SOB/ use of accessory muscles to breath Gastrointestinal:   No nausea, no vomiting.  No diarrhea.  No constipation. Musculoskeletal: Negative for musculoskeletal pain. Skin: Patient has skin tear of right forearm.    ____________________________________________   PHYSICAL EXAM:  VITAL SIGNS: ED Triage Vitals  Enc Vitals Group     BP 04/18/21 1543 (!) 168/71     Pulse Rate 04/18/21 1543 (!) 50     Resp 04/18/21 1543 20     Temp 04/18/21 1543 98 F (36.7 C)     Temp Source 04/18/21 1543 Oral     SpO2 04/18/21 1543 99 %     Weight 04/18/21 1534 135 lb (61.2 kg)     Height 04/18/21 1534 5\' 6"  (1.676 m)     Head Circumference --      Peak Flow --      Pain Score 04/18/21 1534 8     Pain Loc --      Pain Edu? --      Excl. in GC? --      Constitutional: Alert and oriented. Well appearing and in no acute distress. Eyes: Conjunctivae are normal. PERRL. EOMI. Head: Atraumatic. ENT:      Nose: No congestion/rhinnorhea.      Mouth/Throat: Mucous membranes are moist.  Neck: No stridor.  Full range of motion.  No midline C-spine tenderness to palpation. Cardiovascular: Normal rate, regular rhythm. Normal S1 and S2.  Good peripheral circulation. Respiratory: Normal respiratory effort without tachypnea or retractions. Lungs CTAB. Good air entry to the bases with no decreased or absent breath sounds Gastrointestinal: Bowel sounds x 4 quadrants. Soft and nontender to palpation. No guarding or rigidity. No distention. Musculoskeletal: Full range of motion to all extremities. No obvious deformities noted Neurologic:  Normal for age. No gross focal neurologic deficits are appreciated.  Skin: Patient has 3 cm x 2 cm skin tear right forearm. Psychiatric: Mood and affect are  normal for age. Speech and behavior are normal.   ____________________________________________   LABS (all labs ordered are listed, but only abnormal results are displayed)  Labs Reviewed  BASIC METABOLIC PANEL - Abnormal; Notable for the following components:      Result Value   Glucose, Bld 102 (*)    Creatinine, Ser 1.14 (*)    GFR, Estimated 48 (*)    All other components within normal limits  CBC  TROPONIN I (HIGH SENSITIVITY)  TROPONIN I (HIGH SENSITIVITY)   ____________________________________________  EKG   ____________________________________________  RADIOLOGY Geraldo PitterI, Niclas Markell M Shalen Petrak, personally  viewed and evaluated these images (plain radiographs) as part of my medical decision making, as well as reviewing the written report by the radiologist.  DG Forearm Right  Result Date: 04/18/2021 CLINICAL DATA:  Right forearm laceration after fall. EXAM: RIGHT FOREARM - 2 VIEW COMPARISON:  None. FINDINGS: There is no evidence of fracture or other focal bone lesions. Soft tissues are unremarkable. IMPRESSION: Negative. Electronically Signed   By: Lupita Raider M.D.   On: 04/18/2021 18:29   DG Ankle Complete Right  Result Date: 04/18/2021 CLINICAL DATA:  Chronic right ankle pain. EXAM: RIGHT ANKLE - COMPLETE 3+ VIEW COMPARISON:  March 31, 2020. FINDINGS: Status post right tibial talar arthroplasty. Osteophyte formation is noted. Stable lucency is seen involving a large portion of the anterior calcaneus which may represent sequela of degenerative change. No fracture or dislocation is noted. IMPRESSION: Stable chronic findings as described above. No acute abnormality seen. Electronically Signed   By: Lupita Raider M.D.   On: 04/18/2021 18:32   CT HEAD WO CONTRAST ( )  Result Date: 04/18/2021 CLINICAL DATA:  z head trauma, slipped and fell in the shower. EXAM: CT HEAD WITHOUT CONTRAST TECHNIQUE: Contiguous axial images were obtained from the base of the skull through the vertex  without intravenous contrast. COMPARISON:  March 13, 2021. FINDINGS: Brain: Confluent subcortical hypodensity in the LEFT posterior frontal lobe at the site of previous hemorrhage. Resolution of hyperdensity noted in this area on the previous exam. Resolution of subdural effusion along the LEFT convexity since the prior study. Also resolution of smaller area parenchymal hemorrhage seen anterior to the dominant focus in the LEFT posterior frontal lobe. No new signs of intracranial hemorrhage. Ventricles are of similar size. Minimal midline shift now approximately 3 mm as compared to 8 mm on the prior study. Vascular: No hyperdense vessel or unexpected calcification. Skull: Normal. Negative for fracture or focal lesion. Sinuses/Orbits: Visualized paranasal sinuses and orbits are unremarkable. Other: None IMPRESSION: No new signs of intracranial hemorrhage with evolving parenchymal changes associated with prior intraparenchymal hemorrhage in the LEFT posterior frontal lobe. Resolution of subdural effusion along the LEFT convexity with decreased midline shift compared to the prior study as described. Electronically Signed   By: Donzetta Kohut M.D.   On: 04/18/2021 19:05    ____________________________________________    PROCEDURES  Procedure(s) performed:     Marland KitchenMarland KitchenLaceration Repair  Date/Time: 04/18/2021 7:02 PM Performed by: Orvil Feil, PA-C Authorized by: Orvil Feil, PA-C   Consent:    Consent obtained:  Verbal   Risks discussed:  Pain Universal protocol:    Procedure explained and questions answered to patient or proxy's satisfaction: yes     Patient identity confirmed:  Verbally with patient Laceration details:    Location:  Shoulder/arm   Shoulder/arm location:  R lower arm   Length (cm):  3   Depth (mm):  1 Exploration:    Contaminated: no   Treatment:    Amount of cleaning:  Standard Skin repair:    Repair method:  Tissue adhesive Repair type:    Repair type:   Simple Post-procedure details:    Dressing:  Non-adherent dressing     Medications - No data to display   ____________________________________________   INITIAL IMPRESSION / ASSESSMENT AND PLAN / ED COURSE  Pertinent labs & imaging results that were available during my care of the patient were reviewed by me and considered in my medical decision making (see chart for details).  Assessment and plan Fall:  80 year old female presents to the emergency department after patient had a fall at her skilled nursing facility.  Patient was mildly bradycardic at triage but vital signs were otherwise reassuring.  EKG indicated sinus bradycardia but no ST segment elevation or other apparent arrhythmia.  CBC and BMP were reassuring.  Troponin was within reference range.  Repeat CT head showed no evidence of new intracranial bleed and had improvement in midline shift and subdural effusion.  Patient skin tear was repaired with Dermabond in the emergency department and patient education regarding wound care was given.  No new medications were prescribed in the emergency department for pain.     ____________________________________________  FINAL CLINICAL IMPRESSION(S) / ED DIAGNOSES  Final diagnoses:  Fall, initial encounter      NEW MEDICATIONS STARTED DURING THIS VISIT:  ED Discharge Orders     None           This chart was dictated using voice recognition software/Dragon. Despite best efforts to proofread, errors can occur which can change the meaning. Any change was purely unintentional.     Gasper Lloyd 04/18/21 2031    Delton Prairie, MD 04/21/21 [phone]

## 2021-04-18 NOTE — ED Triage Notes (Signed)
Pt comes into the ED via EMS from [facility], states she slipped and feel in the shower. Small avulsion on her right arm with controlled bleeding, pt reported to EMS of [facility].   165/79 56HR 97%RA

## 2021-04-25 ENCOUNTER — Inpatient Hospital Stay: Payer: Self-pay | Admitting: Adult Health

## 2021-04-25 ENCOUNTER — Encounter: Payer: Self-pay | Admitting: Adult Health

## 2021-04-25 NOTE — Progress Notes (Deleted)
[facility] [address] [place]. [place] [address] [phone]       HOSPITAL FOLLOW UP NOTE  [name] Date of Birth:  [birth year] Medical Record Number:  [id number]   Reason for Referral:  hospital stroke follow up    SUBJECTIVE:   CHIEF COMPLAINT:  No chief complaint on file.   HPI:   [name] is a 80 y.o. female with history of depression, hyperlipidemia, hypertension, neuropathy, and thyroid disease and tremor who lives alone but found on her kitchen floor by family with a bottle full of small green or blue pills scattered around on 03/14/2021.  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 MR brain w/wo 8 mm acute hemorrhage in the deep white matter adjacent to the atrium of the right lateral ventricle, 2.8 cm acute hemorrhage at the left posterior frontal vertex with mild surrounding edema, 3-4 mm focal hemorrhages right frontoparietal vertex and at the left posterior frontal vertex just anterior to the larger hemorrhage. No evidence of hemorrhagic metastases and more likely synchronous intraparenchymal hemorrhages.  MRA and carotid Doppler unremarkable.  EF 60 to 65%.  LDL 136.  A1c 5.7.  Recommended BP goal <140 given concern of CAA.  Initiate atorvastatin 20 mg daily.  Other stroke risk factors include advanced age, former tobacco use, former EtOH use and family history of stroke.  No personal prior stroke history. Therapies recommend SNF. D/c to [facility] on 8/2.         PERTINENT IMAGING      ROS:   14 system review of systems performed and negative with exception of ***  PMH:  Past Medical History:  Diagnosis Date   Depression    Hyperlipemia    Hypertension    Neuropathy    Stroke (HCC)    Thyroid disease    Tremor     PSH:  Past Surgical History:  Procedure Laterality Date   ABDOMINAL  HYSTERECTOMY     ANKLE SURGERY Right     Social History:  Social History   Socioeconomic History   Marital status: Married    Spouse name: Not on file   Number of children: Not on file   Years of education: Not on file   Highest education level: Not on file  Occupational History   Not on file  Tobacco Use   Smoking status: Former    Packs/day: 0.15    Types: Cigarettes    Quit date: 07/2019    Years since quitting: 1.7   Smokeless tobacco: Never  Vaping Use   Vaping Use: Never used  Substance and Sexual Activity   Alcohol use: Not Currently   Drug use: No   Sexual activity: Not on file  Other Topics Concern   Not on file  Social History Narrative   Not on file   Social Determinants of Health   Financial Resource Strain: Not on file  Food Insecurity: Not on file  Transportation Needs: Not on file  Physical Activity: Not on file  Stress: Not on file  Social Connections: Not on file  Intimate Partner Violence: Not on file    Family History:  Family History  Problem Relation Age of Onset   Pneumonia Mother    Hypertension Father    Heart disease Father    Stroke Father     Medications:   Current Outpatient  Medications on File Prior to Visit  Medication Sig Dispense Refill   acetaminophen (TYLENOL) 325 MG tablet Take 650 mg by mouth every 6 (six) hours as needed for moderate pain.     atorvastatin (LIPITOR) 20 MG tablet Take 1 tablet (20 mg total) by mouth daily.     Cholecalciferol (VITAMIN D3 PO) Take 1 tablet by mouth daily.     levothyroxine (SYNTHROID) 25 MCG tablet Take 25 mcg by mouth every morning.     lisinopril (ZESTRIL) 40 MG tablet Take 40 mg by mouth daily.     Multiple Vitamin (MULTIVITAMIN WITH MINERALS) TABS tablet Take 1 tablet by mouth daily.     Multiple Vitamins-Minerals (ZINC PO) Take 1 tablet by mouth daily.     propranolol ER (INDERAL LA) 80 MG 24 hr capsule Hold this medication because your heart rate is already low in 50's without  this.     sertraline (ZOLOFT) 100 MG tablet Take 100 mg by mouth daily.     Spacer/Aero-Holding Chambers (AEROCHAMBER PLUS) inhaler Use as instructed 1 each 2   traMADol (ULTRAM) 50 MG tablet Take 50 mg by mouth 3 (three) times daily as needed for moderate pain.     traZODone (DESYREL) 100 MG tablet Take 200 mg by mouth at bedtime as needed for sleep.     No current facility-administered medications on file prior to visit.    Allergies:  No Known Allergies    OBJECTIVE:  Physical Exam  There were no vitals filed for this visit. There is no height or weight on file to calculate BMI. No results found.  No flowsheet data found.   General: well developed, well nourished, seated, in no evident distress Head: head normocephalic and atraumatic.   Neck: supple with no carotid or supraclavicular bruits Cardiovascular: regular rate and rhythm, no murmurs Musculoskeletal: no deformity Skin:  no rash/petichiae Vascular:  Normal pulses all extremities   Neurologic Exam Mental Status: Awake and fully alert. Oriented to place and time. Recent and remote memory intact. Attention span, concentration and fund of knowledge appropriate. Mood and affect appropriate.  Cranial Nerves: Fundoscopic exam reveals sharp disc margins. Pupils equal, briskly reactive to light. Extraocular movements full without nystagmus. Visual fields full to confrontation. Hearing intact. Facial sensation intact. Face, tongue, palate moves normally and symmetrically.  Motor: Normal bulk and tone. Normal strength in all tested extremity muscles Sensory.: intact to touch , pinprick , position and vibratory sensation.  Coordination: Rapid alternating movements normal in all extremities. Finger-to-nose and heel-to-shin performed accurately bilaterally. Gait and Station: Arises from chair without difficulty. Stance is normal. Gait demonstrates normal stride length and balance with ***. Tandem walk and heel toe ***.  Reflexes: 1+  and symmetric. Toes downgoing.     NIHSS  *** Modified Rankin  ***      ASSESSMENT: Jenna Powers is a 80 y.o. 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 Vascular risk factors include HTN, HLD, possible CAA, former tobacco use, former EtOH use and advanced age.      PLAN:  Multiple ICHs : Residual deficit: ***. Concern of possible CAA. Continue {anticoagulants:31417}  and ***  for secondary stroke prevention.  Discussed secondary stroke prevention measures and importance of close PCP follow up for aggressive stroke risk factor management. I have gone over the pathophysiology of stroke, warning signs and symptoms, risk factors and their management in some detail with instructions to go to the  closest emergency room for symptoms of concern. HTN: SBP goal <140s.  Stable on *** per PCP HLD: LDL goal <70. Recent LDL 136. Continue atorvastatin 20 mg daily DMII: A1c goal<7.0. Recent A1c ***.     Follow up in *** or call earlier if needed   CC:  [facility] provider: Dr. Pearlean Brownie PCP: Dortha Kern, MD    I spent *** minutes of face-to-face and non-face-to-face time with patient.  This included previsit chart review including review of recent hospitalization, lab review, study review, order entry, electronic health record documentation, patient education regarding recent stroke including etiology, secondary stroke prevention measures and importance of managing stroke risk factors, residual deficits and typical recovery time and answered all other questions to patient satisfaction   Ihor Austin, AGNP-BC  [facility] [address] [place], [place] [address]  Phone [phone] Fax [phone] Note: This document was prepared with digital dictation and possible smart phrase technology. Any transcriptional errors that result from this process are unintentional.

## 2021-06-14 ENCOUNTER — Other Ambulatory Visit: Payer: Self-pay

## 2021-06-14 ENCOUNTER — Emergency Department: Payer: Medicare HMO

## 2021-06-14 ENCOUNTER — Inpatient Hospital Stay
Admission: EM | Admit: 2021-06-14 | Discharge: 2021-06-22 | DRG: 069 | Disposition: A | Discharge status: Home / Self Care | Payer: Medicare HMO | Attending: Internal Medicine | Admitting: Internal Medicine

## 2021-06-14 DIAGNOSIS — Z823 Family history of stroke: Secondary | ICD-10-CM

## 2021-06-14 DIAGNOSIS — R001 Bradycardia, unspecified: Secondary | ICD-10-CM | POA: Diagnosis present

## 2021-06-14 DIAGNOSIS — Z9114 Patient's other noncompliance with medication regimen: Secondary | ICD-10-CM

## 2021-06-14 DIAGNOSIS — I10 Essential (primary) hypertension: Secondary | ICD-10-CM | POA: Diagnosis not present

## 2021-06-14 DIAGNOSIS — Z8673 Personal history of transient ischemic attack (TIA), and cerebral infarction without residual deficits: Secondary | ICD-10-CM

## 2021-06-14 DIAGNOSIS — R410 Disorientation, unspecified: Secondary | ICD-10-CM

## 2021-06-14 DIAGNOSIS — R262 Difficulty in walking, not elsewhere classified: Secondary | ICD-10-CM | POA: Diagnosis present

## 2021-06-14 DIAGNOSIS — R946 Abnormal results of thyroid function studies: Secondary | ICD-10-CM | POA: Diagnosis present

## 2021-06-14 DIAGNOSIS — Z87891 Personal history of nicotine dependence: Secondary | ICD-10-CM

## 2021-06-14 DIAGNOSIS — I160 Hypertensive urgency: Secondary | ICD-10-CM

## 2021-06-14 DIAGNOSIS — Z79899 Other long term (current) drug therapy: Secondary | ICD-10-CM

## 2021-06-14 DIAGNOSIS — R531 Weakness: Secondary | ICD-10-CM | POA: Diagnosis present

## 2021-06-14 DIAGNOSIS — Z20822 Contact with and (suspected) exposure to covid-19: Secondary | ICD-10-CM | POA: Diagnosis present

## 2021-06-14 DIAGNOSIS — I129 Hypertensive chronic kidney disease with stage 1 through stage 4 chronic kidney disease, or unspecified chronic kidney disease: Secondary | ICD-10-CM | POA: Diagnosis present

## 2021-06-14 DIAGNOSIS — F32A Depression, unspecified: Secondary | ICD-10-CM | POA: Diagnosis present

## 2021-06-14 DIAGNOSIS — R451 Restlessness and agitation: Secondary | ICD-10-CM | POA: Diagnosis not present

## 2021-06-14 DIAGNOSIS — Z8659 Personal history of other mental and behavioral disorders: Secondary | ICD-10-CM | POA: Diagnosis not present

## 2021-06-14 DIAGNOSIS — E785 Hyperlipidemia, unspecified: Secondary | ICD-10-CM | POA: Diagnosis present

## 2021-06-14 DIAGNOSIS — R29898 Other symptoms and signs involving the musculoskeletal system: Secondary | ICD-10-CM | POA: Diagnosis present

## 2021-06-14 DIAGNOSIS — F05 Delirium due to known physiological condition: Secondary | ICD-10-CM | POA: Diagnosis not present

## 2021-06-14 DIAGNOSIS — Z23 Encounter for immunization: Secondary | ICD-10-CM

## 2021-06-14 DIAGNOSIS — F419 Anxiety disorder, unspecified: Secondary | ICD-10-CM | POA: Diagnosis present

## 2021-06-14 DIAGNOSIS — G459 Transient cerebral ischemic attack, unspecified: Secondary | ICD-10-CM | POA: Diagnosis not present

## 2021-06-14 DIAGNOSIS — N1831 Chronic kidney disease, stage 3a: Secondary | ICD-10-CM | POA: Diagnosis present

## 2021-06-14 DIAGNOSIS — E038 Other specified hypothyroidism: Secondary | ICD-10-CM | POA: Diagnosis present

## 2021-06-14 DIAGNOSIS — Z8249 Family history of ischemic heart disease and other diseases of the circulatory system: Secondary | ICD-10-CM

## 2021-06-14 DIAGNOSIS — I639 Cerebral infarction, unspecified: Secondary | ICD-10-CM | POA: Diagnosis present

## 2021-06-14 DIAGNOSIS — I161 Hypertensive emergency: Secondary | ICD-10-CM | POA: Diagnosis present

## 2021-06-14 HISTORY — DX: Anxiety disorder, unspecified: F41.9

## 2021-06-14 HISTORY — DX: Chronic kidney disease, unspecified: N18.9

## 2021-06-14 HISTORY — DX: Hypothyroidism, unspecified: E03.9

## 2021-06-14 LAB — URINALYSIS, ROUTINE W REFLEX MICROSCOPIC
Bilirubin Urine: NEGATIVE
Glucose, UA: NEGATIVE mg/dL
Hgb urine dipstick: NEGATIVE
Ketones, ur: NEGATIVE mg/dL
Leukocytes,Ua: NEGATIVE
Nitrite: NEGATIVE
Protein, ur: NEGATIVE mg/dL
Specific Gravity, Urine: 1.005 (ref 1.005–1.030)
pH: 8 (ref 5.0–8.0)

## 2021-06-14 LAB — COMPREHENSIVE METABOLIC PANEL
ALT: 20 U/L (ref 0–44)
AST: 30 U/L (ref 15–41)
Albumin: 3.7 g/dL (ref 3.5–5.0)
Alkaline Phosphatase: 87 U/L (ref 38–126)
Anion gap: 7 (ref 5–15)
BUN: 13 mg/dL (ref 8–23)
CO2: 26 mmol/L (ref 22–32)
Calcium: 9.3 mg/dL (ref 8.9–10.3)
Chloride: 103 mmol/L (ref 98–111)
Creatinine, Ser: 1.16 mg/dL — ABNORMAL HIGH (ref 0.44–1.00)
GFR, Estimated: 47 mL/min — ABNORMAL LOW (ref >60)
Glucose, Bld: 97 mg/dL (ref 70–99)
Potassium: 3.9 mmol/L (ref 3.5–5.1)
Sodium: 136 mmol/L (ref 135–145)
Total Bilirubin: 0.9 mg/dL (ref 0.3–1.2)
Total Protein: 6.5 g/dL (ref 6.5–8.1)

## 2021-06-14 LAB — URINE DRUG SCREEN, QUALITATIVE (ARMC ONLY)
Amphetamines, Ur Screen: NOT DETECTED
Barbiturates, Ur Screen: NOT DETECTED
Benzodiazepine, Ur Scrn: NOT DETECTED
Cannabinoid 50 Ng, Ur ~~LOC~~: NOT DETECTED
Cocaine Metabolite,Ur ~~LOC~~: NOT DETECTED
MDMA (Ecstasy)Ur Screen: NOT DETECTED
Methadone Scn, Ur: NOT DETECTED
Opiate, Ur Screen: NOT DETECTED
Phencyclidine (PCP) Ur S: NOT DETECTED
Tricyclic, Ur Screen: NOT DETECTED

## 2021-06-14 LAB — DIFFERENTIAL
Abs Immature Granulocytes: 0.03 10*3/uL (ref 0.00–0.07)
Basophils Absolute: 0.1 10*3/uL (ref 0.0–0.1)
Basophils Relative: 1 %
Eosinophils Absolute: 0.2 10*3/uL (ref 0.0–0.5)
Eosinophils Relative: 2 %
Immature Granulocytes: 0 %
Lymphocytes Relative: 23 %
Lymphs Abs: 1.8 10*3/uL (ref 0.7–4.0)
Monocytes Absolute: 0.7 10*3/uL (ref 0.1–1.0)
Monocytes Relative: 10 %
Neutro Abs: 4.9 10*3/uL (ref 1.7–7.7)
Neutrophils Relative %: 64 %

## 2021-06-14 LAB — CBC
HCT: 40 % (ref 36.0–46.0)
Hemoglobin: 13.3 g/dL (ref 12.0–15.0)
MCH: 30.7 pg (ref 26.0–34.0)
MCHC: 33.3 g/dL (ref 30.0–36.0)
MCV: 92.4 fL (ref 80.0–100.0)
Platelets: 296 10*3/uL (ref 150–400)
RBC: 4.33 MIL/uL (ref 3.87–5.11)
RDW: 12.8 % (ref 11.5–15.5)
WBC: 7.7 10*3/uL (ref 4.0–10.5)
nRBC: 0 % (ref 0.0–0.2)

## 2021-06-14 LAB — RESP PANEL BY RT-PCR (FLU A&B, COVID) ARPGX2
Influenza A by PCR: NEGATIVE
Influenza B by PCR: NEGATIVE
SARS Coronavirus 2 by RT PCR: NEGATIVE

## 2021-06-14 LAB — TROPONIN I (HIGH SENSITIVITY): Troponin I (High Sensitivity): 8 ng/L (ref <18)

## 2021-06-14 LAB — APTT: aPTT: 29 seconds (ref 24–36)

## 2021-06-14 LAB — ETHANOL: Alcohol, Ethyl (B): <10 mg/dL (ref <10)

## 2021-06-14 LAB — PROTIME-INR
INR: 1 (ref 0.8–1.2)
Prothrombin Time: 13.3 seconds (ref 11.4–15.2)

## 2021-06-14 MED ORDER — TRAMADOL HCL 50 MG PO TABS
50.0000 mg | ORAL_TABLET | Freq: Once | ORAL | Status: AC
  Administered 2021-06-14: 50 mg via ORAL
  Filled 2021-06-14: qty 1

## 2021-06-14 MED ORDER — ASPIRIN EC 81 MG PO TBEC
81.0000 mg | DELAYED_RELEASE_TABLET | Freq: Every day | ORAL | Status: DC
  Administered 2021-06-14 – 2021-06-22 (×9): 81 mg via ORAL
  Filled 2021-06-14 (×9): qty 1

## 2021-06-14 MED ORDER — HYDRALAZINE HCL 20 MG/ML IJ SOLN
10.0000 mg | Freq: Once | INTRAMUSCULAR | Status: AC
  Administered 2021-06-14: 10 mg via INTRAVENOUS
  Filled 2021-06-14: qty 1

## 2021-06-14 MED ORDER — AMLODIPINE BESYLATE 5 MG PO TABS
5.0000 mg | ORAL_TABLET | Freq: Once | ORAL | Status: AC
  Administered 2021-06-14: 5 mg via ORAL
  Filled 2021-06-14: qty 1

## 2021-06-14 NOTE — ED Provider Notes (Addendum)
[facility] Emergency Department Provider Note    Event Date/Time   First MD Initiated Contact with Patient 06/14/21 1616     (approximate)  I have reviewed the triage vital signs and the nursing notes.   HISTORY  Chief Complaint Hypertension (Pt called EMS for numbness in hands that felt similar to prior stroke. On arrival, BP found to be 230/130. Pt denies other complaints at this time. States 200's is baseline for SPB. )    HPI Jenna Powers is a 80 y.o. female with the below listed past medical history presents to the ER for evaluation of weakness of her right hand that occurred around 1:00 when she was at home reaching for something in her cabinet.  States that symptoms lasted about 15 minutes to 30 minutes and resolved spontaneously.  No associated headaches.  No new numbness or tingling.  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  She not on any blood thinners.  Denies any chest pain or pressure.  No recent fevers.  Was found to be quite hypertensive with EMS.  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 Authorizing Provider  acetaminophen (TYLENOL) 325 MG tablet Take 650 mg by mouth every 6 (six) hours as needed for moderate pain.    [provider]  atorvastatin (LIPITOR) 20 MG tablet Take 1 tablet (20 mg total) by mouth daily. 03/21/21   Marinda Elk, MD  Cholecalciferol (VITAMIN D3 PO) Take 1 tablet by mouth daily.    [provider]  levothyroxine (SYNTHROID) 25 MCG tablet Take 25 mcg by mouth every morning. 07/31/19   [provider]  lisinopril (ZESTRIL) 40 MG tablet Take 40 mg by mouth daily. 07/23/19   [provider]  Multiple Vitamin (MULTIVITAMIN WITH MINERALS) TABS tablet Take 1 tablet by mouth daily.    [provider]  Multiple Vitamins-Minerals (ZINC PO) Take 1 tablet by mouth daily.    [provider]  propranolol ER (INDERAL LA) 80 MG 24 hr capsule Hold this medication because your heart rate is already low in 50's without this. 09/11/19   Darlin Priestly, MD  sertraline (ZOLOFT) 100 MG tablet Take 100 mg by mouth daily. 08/27/19   [provider]  Spacer/Aero-Holding Chambers (AEROCHAMBER PLUS) inhaler Use as instructed 10/28/19   Domenick Gong, MD  traMADol (ULTRAM) 50 MG tablet Take 50 mg by mouth 3 (three)  times daily as needed for moderate pain. 02/07/21   [provider]  traZODone (DESYREL) 100 MG tablet Take 200 mg by mouth at bedtime as needed for sleep. 01/17/21   [provider]    Allergies Codeine    Social History Social History   Tobacco Use   Smoking status: Former    Packs/day: 0.15    Types: Cigarettes    Quit date: 07/2019    Years since quitting: 1.9   Smokeless tobacco: Never  Vaping Use   Vaping Use: Never used  Substance Use Topics   Alcohol use: Not Currently   Drug use: No    Review of Systems Patient denies headaches, rhinorrhea, blurry vision,  numbness, shortness of breath, chest pain, edema, cough, abdominal pain, nausea, vomiting, diarrhea, dysuria, fevers, rashes or hallucinations unless otherwise stated above in HPI. ____________________________________________   PHYSICAL EXAM:  VITAL SIGNS: Vitals:   06/14/21 1700 06/14/21 1730  BP: (!) 226/109   Pulse: 76 82  Resp: 20 17  Temp:    SpO2: 93% 96%    Constitutional: Alert and oriented.  Eyes: Conjunctivae are normal.  Head: Atraumatic. Nose: No congestion/rhinnorhea. Mouth/Throat: Mucous membranes are moist.   Neck: No stridor. Painless ROM.  Cardiovascular: Normal rate, regular rhythm. Grossly normal heart sounds.  Good peripheral circulation. Respiratory: Normal respiratory effort.  No retractions. Lungs CTAB. Gastrointestinal: Soft and nontender. No distention. No abdominal bruits. No CVA tenderness. Genitourinary:  Musculoskeletal: No lower extremity tenderness nor edema.  No joint effusions. Neurologic:  CN- intact.  No facial droop, Normal FNF.  Normal heel to shin.  Sensation intact bilaterally. Normal speech and language. No gross focal neurologic deficits are appreciated.  Skin:  Skin is warm, dry and intact. No rash noted. Psychiatric: Mood and affect are normal. Speech and behavior are normal.  ____________________________________________   LABS (all labs ordered are listed, but only abnormal results are displayed)  Results for orders placed or performed during the hospital encounter of 06/14/21 (from the past 24 hour(s))  Protime-INR     Status: None   Collection Time: 06/14/21  4:19 PM  Result Value Ref Range   Prothrombin Time 13.3 11.4 - 15.2 seconds   INR 1.0 0.8 - 1.2  APTT     Status: None   Collection Time: 06/14/21  4:19 PM  Result Value Ref Range   aPTT 29 24 - 36 seconds  CBC     Status: None   Collection Time: 06/14/21  4:19 PM  Result Value Ref Range   WBC 7.7 4.0 - 10.5 K/uL   RBC 4.33 3.87 - 5.11 MIL/uL   Hemoglobin 13.3 12.0  - 15.0 g/dL   HCT 56.3 14.9 - 70.2 %   MCV 92.4 80.0 - 100.0 fL   MCH 30.7 26.0 - 34.0 pg   MCHC 33.3 30.0 - 36.0 g/dL   RDW 63.7 85.8 - 85.0 %   Platelets 296 150 - 400 K/uL   nRBC 0.0 0.0 - 0.2 %  Differential     Status: None   Collection Time: 06/14/21  4:19 PM  Result Value Ref Range   Neutrophils Relative % 64 %   Neutro Abs 4.9 1.7 - 7.7 K/uL   Lymphocytes Relative 23 %   Lymphs Abs 1.8 0.7 - 4.0 K/uL   Monocytes Relative 10 %   Monocytes Absolute 0.7 0.1 - 1.0 K/uL   Eosinophils Relative 2 %   Eosinophils Absolute 0.2 0.0 - 0.5 K/uL   Basophils  Relative 1 %   Basophils Absolute 0.1 0.0 - 0.1 K/uL   Immature Granulocytes 0 %   Abs Immature Granulocytes 0.03 0.00 - 0.07 K/uL  Comprehensive metabolic panel     Status: Abnormal   Collection Time: 06/14/21  4:19 PM  Result Value Ref Range   Sodium 136 135 - 145 mmol/L   Potassium 3.9 3.5 - 5.1 mmol/L   Chloride 103 98 - 111 mmol/L   CO2 26 22 - 32 mmol/L   Glucose, Bld 97 70 - 99 mg/dL   BUN 13 8 - 23 mg/dL   Creatinine, Ser 8.33 (H) 0.44 - 1.00 mg/dL   Calcium 9.3 8.9 - 82.5 mg/dL   Total Protein 6.5 6.5 - 8.1 g/dL   Albumin 3.7 3.5 - 5.0 g/dL   AST 30 15 - 41 U/L   ALT 20 0 - 44 U/L   Alkaline Phosphatase 87 38 - 126 U/L   Total Bilirubin 0.9 0.3 - 1.2 mg/dL   GFR, Estimated 47 (L) >60 mL/min   Anion gap 7 5 - 15  Troponin I (High Sensitivity)     Status: None   Collection Time: 06/14/21  4:19 PM  Result Value Ref Range   Troponin I (High Sensitivity) 8 <18 ng/L  Ethanol     Status: None   Collection Time: 06/14/21  5:51 PM  Result Value Ref Range   Alcohol, Ethyl (B) <10 <10 mg/dL   ____________________________________________  EKG My review and personal interpretation at Time: 16:11   Indication: htn  Rate: 70  Rhythm: sinus Axis: normal Other: normal intervals, no stemi, nonspecific st abn ____________________________________________  RADIOLOGY  I personally reviewed all radiographic images ordered  to evaluate for the above acute complaints and reviewed radiology reports and findings.  These findings were personally discussed with the patient.  Please see medical record for radiology report.  ____________________________________________   PROCEDURES  Procedure(s) performed:  Procedures    Critical Care performed: no ____________________________________________   INITIAL IMPRESSION / ASSESSMENT AND PLAN / ED COURSE  Pertinent labs & imaging results that were available during my care of the patient were reviewed by me and considered in my medical decision making (see chart for details).   DDX: cva, tia, hypoglycemia, dehydration, electrolyte abnormality, dissection, sepsis   Ilyse Tremain is a 80 y.o. who presents to the ED with Joslyn Devon as described above.  Patient not medicated stroke as her symptoms resolved prior to arrival to the ER as well as given her history of IPH.  Stroke work-up initiated with blood work as well as CT imaging.  CT imaging without any evidence of acute abnormality.  I have a lower suspicion for seizure though that is on the differential.  Based on her significant hypertension and age risk factors not on anticoagulation discussed case in consultation with neurology, Dr. Selina Cooley, who recommends admission to the hospital for blood pressure control and further neuroimaging and neuro consultation.  Recommending MRI/MRA brain and neck.  TTE, 81mg  ASA,  SBP 140-160, TTE, ldl and a1c. Discussed plan with patient she is agreeable.  Will consult hospitalist for admission.      The patient was evaluated in Emergency Department today for the symptoms described in the history of present illness. He/she was evaluated in the context of the global COVID-19 pandemic, which necessitated consideration that the patient might be at risk for infection with the SARS-CoV-2 virus that causes COVID-19. Institutional protocols and algorithms that pertain to the evaluation of patients  at risk for COVID-19 are in a state of rapid change based on information released by regulatory bodies including the CDC and federal and state organizations. These policies and algorithms were followed during the patient's care in the ED.  As part of my medical decision making, I reviewed the following data within the electronic MEDICAL RECORD NUMBER Nursing notes reviewed and incorporated, Labs reviewed, notes from prior ED visits and [place] Controlled Substance Database   ____________________________________________   FINAL CLINICAL IMPRESSION(S) / ED DIAGNOSES  Final diagnoses:  Hypertensive urgency  Right hand weakness      NEW MEDICATIONS STARTED DURING THIS VISIT:  New Prescriptions   No medications on file     Note:  This document was prepared using Dragon voice recognition software and may include unintentional dictation errors.    Willy Eddy, MD 06/14/21 1740    Willy Eddy, MD 06/14/21 [phone]

## 2021-06-14 NOTE — ED Notes (Signed)
EDT Chamara Dyck assisted pt to the toilet. Tech changed linen and chucks. Purewick and a dry brief was placed on pt.

## 2021-06-14 NOTE — H&P (Signed)
History and Physical    Jenna Powers WUJ:[id number] DOB: October 30, 1940 DOA: 06/14/2021  PCP: Dortha Kern, MD    Patient coming from:  Home   Chief Complaint:  Right hand weakness   HPI: Jenna Powers is a 80 y.o. female seen in ed with complaints of right hand weakness that started about noon today when she was reaching for something in her cabinet she noticed it per report it lasted about 15 to 30 minutes and resolved spontaneously there was no associated speech speech gait or vision or headaches or any other associated symptoms of numbness tingling issues patient does have a known history of intracranial hemorrhage in the past about 7 months ago per report and patient is not on any blood thinners because of that. Patient states that today at home she was trying to grab the ice cream from the freezer and her hand felt numb she dropped the ice cream and and lasted for about 30 minutes and then it resolved.  She was concerned and scared because she thought she was having another stroke.  The last time she had a stroke her right upper extremity was affected and is weak to a certain degree at baseline.  EDMD did contact neurology and consult them as patient has a history of a recent stroke and intracranial hemorrhage.  Per ED provider neurology wanted patient to be admitted with aspirin 81, and TEE and MRI with blood pressure goal of systolics of 140s-160s.  Patient is alert awake oriented smiling answering questions exam is nonfocal no cranial nerve weakness or deficit. Review of systems is negative for any headaches blurred vision speech or gait issues any focal weakness any chest pain palpitations shortness of breath any fevers chills abdominal pain or bowel or bladder issues.  Pt has past medical history of depression, hypertension, history of stroke, intracranial hemorrhage, acute kidney injury on chronic kidney disease, rhabdomyolysis, acute right hemiparesis in July 2022,  hypothyroidism, anxiety depression, history of tobacco abuse.  ED Course:  Vitals:   06/14/21 2115 06/14/21 2215 06/14/21 2230 06/14/21 2300  BP:  (!) 163/104 (!) 184/88 (!) 151/77  Pulse:  69 67 82  Resp: (!) 21  Temp:      TempSrc:      SpO2:  97% 96% 94%  Weight:      Height:      In the emergency room patient meets hypertensive urgency criteria with right hand numbness. Patient is otherwise alert awake oriented afebrile oxygenating normal on room air. Blood work includes normal CBC with a white count of 7.7 hemoglobin 13.3 and platelet counts of 296, Initial EKG shows sinus rhythm at 70 with normal axis, normal intervals.  No ST-T wave changes. Case discussed with ED physician who requested neurology consult and per neurology recommendations patient is to get him on aspirin, blood pressure goal per permissive hypertension in this case is 140s to 160s systolic because of her history of ICH/IPH. Per neurology recommendation transesophageal echocardiogram along with an MRI of the brain and head and neck with and without contrast.  Review of Systems:  Review of Systems  Constitutional: Negative.   HENT: Negative.    Eyes: Negative.   Respiratory: Negative.    Cardiovascular: Negative.  Negative for chest pain and palpitations.  Gastrointestinal: Negative.   Genitourinary: Negative.   Musculoskeletal: Negative.   Skin: Negative.   Neurological:  Positive for tingling and weakness.  All other systems reviewed and are negative.  Past Medical History:  Diagnosis Date   Depression    Hyperlipemia    Hypertension    Neuropathy    Stroke [facility])    Thyroid disease    Tremor     Past Surgical History:  Procedure Laterality Date   ABDOMINAL HYSTERECTOMY     ANKLE SURGERY Right      reports that she quit smoking about 22 months ago. Her smoking use included cigarettes. She smoked an average of .15 packs per day. She has never used smokeless tobacco. She reports that  she does not currently use alcohol. She reports that she does not use drugs.  Allergies  Allergen Reactions   Codeine Other (See Comments)    Pt reports allergy but does not remember what it was    Family History  Problem Relation Age of Onset   Pneumonia Mother    Hypertension Father    Heart disease Father    Stroke Father     Prior to Admission medications   Medication Sig Start Date End Date Taking? Authorizing Provider  acetaminophen (TYLENOL) 325 MG tablet Take 650 mg by mouth every 6 (six) hours as needed for moderate pain.    [provider]  atorvastatin (LIPITOR) 20 MG tablet Take 1 tablet (20 mg total) by mouth daily. 03/21/21   Marinda Elk, MD  Cholecalciferol (VITAMIN D3 PO) Take 1 tablet by mouth daily.    [provider]  levothyroxine (SYNTHROID) 25 MCG tablet Take 25 mcg by mouth every morning. 07/31/19   [provider]  lisinopril (ZESTRIL) 40 MG tablet Take 40 mg by mouth daily. 07/23/19   [provider]  Multiple Vitamin (MULTIVITAMIN WITH MINERALS) TABS tablet Take 1 tablet by mouth daily.    [provider]  Multiple Vitamins-Minerals (ZINC PO) Take 1 tablet by mouth daily.    [provider]  propranolol ER (INDERAL LA) 80 MG 24 hr capsule Hold this medication because your heart rate is already low in 50's without this. 09/11/19   Darlin Priestly, MD  sertraline (ZOLOFT) 100 MG tablet Take 100 mg by mouth daily. 08/27/19   [provider]  Spacer/Aero-Holding Chambers (AEROCHAMBER PLUS) inhaler Use as instructed 10/28/19   Domenick Gong, MD  traMADol (ULTRAM) 50 MG tablet Take 50 mg by mouth 3 (three) times daily as needed for moderate pain. 02/07/21   [provider]  traZODone (DESYREL) 100 MG tablet Take 200 mg by mouth at bedtime as needed for sleep. 01/17/21   [provider]    Physical Exam: Vitals:   06/14/21 2115 06/14/21 2215 06/14/21 2230 06/14/21 2300  BP:  (!)  163/104 (!) 184/88 (!) 151/77  Pulse:  69 67 82  Resp: 16 20 15  (!) 21  Temp:      TempSrc:      SpO2:  97% 96% 94%  Weight:      Height:       Physical Exam Vitals and nursing note reviewed.  Constitutional:      General: She is not in acute distress.    Appearance: Normal appearance. She is not ill-appearing, toxic-appearing or diaphoretic.  HENT:     Head: Normocephalic and atraumatic.     Right Ear: External ear normal.     Left Ear: External ear normal.     Nose: Nose normal.     Mouth/Throat:     Mouth: Mucous membranes are moist.  Eyes:     Extraocular Movements: Extraocular movements intact.  Pupils: Pupils are equal, round, and reactive to light.  Neck:     Vascular: No carotid bruit.  Cardiovascular:     Rate and Rhythm: Normal rate and regular rhythm.     Pulses: Normal pulses.     Heart sounds: Normal heart sounds.  Pulmonary:     Effort: Pulmonary effort is normal.     Breath sounds: Normal breath sounds.  Abdominal:     General: Bowel sounds are normal. There is no distension.     Palpations: Abdomen is soft. There is no mass.     Tenderness: There is no abdominal tenderness. There is no guarding.     Hernia: No hernia is present.  Musculoskeletal:     Right lower leg: No edema.     Left lower leg: No edema.  Skin:    General: Skin is warm.  Neurological:     General: No focal deficit present.     Mental Status: She is alert and oriented to person, place, and time.  Psychiatric:        Mood and Affect: Mood normal.        Behavior: Behavior normal.    Labs on Admission: I have personally reviewed following labs and imaging studies  No results for input(s): CKTOTAL, CKMB, TROPONINI in the last 72 hours. Lab Results  Component Value Date   WBC 7.7 06/14/2021   HGB 13.3 06/14/2021   HCT 40.0 06/14/2021   MCV 92.4 06/14/2021   PLT 296 06/14/2021    Recent Labs  Lab 06/14/21 1619  NA 136  K 3.9  CL 103  CO2 26  BUN 13  CREATININE 1.16*   CALCIUM 9.3  PROT 6.5  BILITOT 0.9  ALKPHOS 87  ALT 20  AST 30  GLUCOSE 97   Lab Results  Component Value Date   CHOL 225 (H) 03/14/2021   HDL 45 03/14/2021   LDLCALC 136 (H) 03/14/2021   TRIG 221 (H) 03/14/2021   No results found for: DDIMER Invalid input(s): POCBNP  Urinalysis    Component Value Date/Time   COLORURINE STRAW (A) 06/14/2021 1751   APPEARANCEUR CLEAR (A) 06/14/2021 1751   APPEARANCEUR Hazy 06/13/2013 1729   LABSPEC 1.005 06/14/2021 1751   LABSPEC 1.013 06/13/2013 1729   PHURINE 8.0 06/14/2021 1751   GLUCOSEU NEGATIVE 06/14/2021 1751   GLUCOSEU Negative 06/13/2013 1729   HGBUR NEGATIVE 06/14/2021 1751   BILIRUBINUR NEGATIVE 06/14/2021 1751   BILIRUBINUR Negative 06/13/2013 1729   KETONESUR NEGATIVE 06/14/2021 1751   PROTEINUR NEGATIVE 06/14/2021 1751   NITRITE NEGATIVE 06/14/2021 1751   LEUKOCYTESUR NEGATIVE 06/14/2021 1751   LEUKOCYTESUR Trace 06/13/2013 1729    COVID-19 Labs No results for input(s): DDIMER, FERRITIN, LDH, CRP in the last 72 hours. Lab Results  Component Value Date   SARSCOV2NAA NEGATIVE 06/14/2021   SARSCOV2NAA NEGATIVE 03/21/2021   SARSCOV2NAA NEGATIVE 03/13/2021   SARSCOV2NAA NEGATIVE 09/10/2019    Radiological Exams on Admission: CT HEAD WO CONTRAST  Result Date: 06/14/2021 CLINICAL DATA:  Transient ischemic attack. Additional history provided: Weak grip. EXAM: CT HEAD WITHOUT CONTRAST TECHNIQUE: Contiguous axial images were obtained from the base of the skull through the vertex without intravenous contrast. COMPARISON:  Prior head CT examinations 04/18/2021 and earlier. FINDINGS: Brain: Mild cerebral and cerebellar atrophy. Persistent although decreased hypodensity at site of a previous parenchymal hemorrhage within the posterior left frontal lobe, compatible with gliosis and possible mild residual edema. Background minimal patchy and ill-defined hypoattenuation within the cerebral white matter, nonspecific  but compatible  with chronic small vessel ischemic disease. There is no acute intracranial hemorrhage. No demarcated cortical infarct. No extra-axial fluid collection. No evidence of an intracranial mass. No midline shift. Vascular: No hyperdense vessel.  Atherosclerotic calcifications Skull: Normal. Negative for fracture or focal lesion. Sinuses/Orbits: Visualized orbits show no acute finding. Trace mucosal thickening within the bilateral ethmoid sinuses. IMPRESSION: No evidence of acute intracranial abnormality. Persistent although decreased hypodensity at site of a previous parenchymal hemorrhage within the posterior left frontal lobe. This is compatible with gliosis and possible mild residual edema. Background minimal chronic small-vessel ischemic changes within the cerebral white matter. Mild generalized parenchymal atrophy. Electronically Signed   By: Jackey Loge D.O.   On: 06/14/2021 16:54   MR ANGIO HEAD WO CONTRAST Result Date: 06/14/2021 CLINICAL DATA:  Initial evaluation for neuro deficit, stroke suspected. Transient right hand weakness. EXAM: MRI HEAD WITHOUT CONTRAST MRA HEAD WITHOUT CONTRAST TECHNIQUE: Multiplanar, multiecho pulse sequences of the brain and surrounding structures were obtained without intravenous contrast. Angiographic images of the Circle of Willis were obtained using MRA technique without intravenous contrast. Angiographic images of the neck were obtained using MRA technique without intravenous contrast. Carotid stenosis measurements (when applicable) are obtained utilizing NASCET criteria, using the distal internal carotid diameter as the denominator. COMPARISON:  Prior CT from earlier the same day as well as previous MRI from 03/14/2021. FINDINGS: MRI HEAD FINDINGS Brain: Examination degraded by motion artifact. Additionally, patient was unable to tolerate the full length of the study. Age-related cerebral atrophy again noted. Underlying minor chronic microvascular ischemic disease for age.  There has been continued interval evolution of previously identified intraparenchymal hemorrhage at the posterior left frontal vertex, subacute in appearance with associated restricted diffusion. Hematoma has decreased in size and partially contracted now measuring 1.9 x 0.9 cm (series 5, image 41). No significant surrounding edema. Residual 9 mm focus of susceptibility artifact at site of previously seen right periatrial hemorrhage (series 13, image 31). No other new acute intracranial hemorrhage evident on this motion degraded exam. No evidence for acute or subacute ischemia elsewhere. Gray-white matter differentiation otherwise maintained. No other mass lesion, mass effect or midline shift. No hydrocephalus or extra-axial fluid collection. Pituitary gland suprasellar region within normal limits. Midline structures intact. Vascular: Major intracranial vascular flow voids are maintained at the skull base. Skull and upper cervical spine: Craniocervical junction within normal limits. Bone marrow signal intensity normal. No scalp soft tissue abnormality. Sinuses/Orbits: Globes orbital soft tissues within normal limits. Paranasal sinuses are largely clear. No significant mastoid effusion. Inner ear structures grossly normal. Other: None. MRA HEAD FINDINGS ANTERIOR CIRCULATION: Examination degraded by motion artifact. Visualized distal cervical segments of the internal carotid arteries are patent with antegrade flow. Petrous segments patent bilaterally. Probable mild atheromatous change within the carotid siphons without hemodynamically significant stenosis. 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 Small-vessel again seen coursing from the tip, consistent with a small vascular infundibulum (series 1, image 75). This is stable from prior. A1 segments widely patent. Normal anterior communicating artery complex. Anterior cerebral arteries patent without high-grade  stenosis. No M1 stenosis or occlusion. Normal MCA bifurcations. Distal MCA branches perfused and symmetric. POSTERIOR CIRCULATION: Visualized vertebral arteries patent without stenosis. Left vertebral artery dominant. Both PICA origins are patent and normal. Basilar patent without stenosis. Superior cerebellar arteries patent bilaterally. Both PCAs primarily supplied via the basilar well perfused to their distal aspects. No other aneurysm or vascular abnormality  seen about the intracranial circulation. IMPRESSION: MRI HEAD IMPRESSION:  1. Technically limited exam due to patient's inability to tolerate the full length of the study and motion artifact.  2. 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 No significant surrounding edema.  3. No other new acute intracranial infarct or other abnormality. MRA HEAD IMPRESSION:  1. Motion degraded exam.  2. Stable appearance of the intracranial circulation with no large vessel occlusion or hemodynamically significant stenosis.  3. 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 No aneurysm or other vascular abnormality. Please note that while an MRA of the neck was also ordered as a part of this exam, the patient was unable to tolerate the full length of the study. Consider re-attempt when the patient is able to tolerate. Alternatively, carotid Doppler ultrasound of the neck could be performed as well. Electronically Signed   By: Rise Mu M.D.   On: 06/14/2021 20:42   MR ANGIO NECK WO CONTRAST Result Date: 06/14/2021 CLINICAL DATA:  Initial evaluation for neuro deficit, stroke suspected. Transient right hand weakness. EXAM: MRI HEAD WITHOUT CONTRAST MRA HEAD WITHOUT CONTRAST TECHNIQUE: Multiplanar, multiecho pulse sequences of the brain and surrounding structures were obtained without intravenous contrast. Angiographic  images of the Circle of Willis were obtained using MRA technique without intravenous contrast. Angiographic images of the neck were obtained using MRA technique without intravenous contrast. Carotid stenosis measurements (when applicable) are obtained utilizing NASCET criteria, using the distal internal carotid diameter as the denominator. COMPARISON:  Prior CT from earlier the same day as well as previous MRI from 03/14/2021. FINDINGS: MRI HEAD FINDINGS Brain: Examination degraded by motion artifact. Additionally, patient was unable to tolerate the full length of the study. Age-related cerebral atrophy again noted. Underlying minor chronic microvascular ischemic disease for age. There has been continued interval evolution of previously identified intraparenchymal hemorrhage at the posterior left frontal vertex, subacute in appearance with associated restricted diffusion. Hematoma has decreased in size and partially contracted now measuring 1.9 x 0.9 cm (series 5, image 41). No significant surrounding edema. Residual 9 mm focus of susceptibility artifact at site of previously seen right periatrial hemorrhage (series 13, image 31). No other new acute intracranial hemorrhage evident on this motion degraded exam. No evidence for acute or subacute ischemia elsewhere. Gray-white matter differentiation otherwise maintained. No other mass lesion, mass effect or midline shift. No hydrocephalus or extra-axial fluid collection. Pituitary gland suprasellar region within normal limits. Midline structures intact. Vascular: Major intracranial vascular flow voids are maintained at the skull base. Skull and upper cervical spine: Craniocervical junction within normal limits. Bone marrow signal intensity normal. No scalp soft tissue abnormality. Sinuses/Orbits: Globes orbital soft tissues within normal limits. Paranasal sinuses are largely clear. No significant mastoid effusion. Inner ear structures grossly normal. Other: None. MRA  HEAD FINDINGS ANTERIOR CIRCULATION: Examination degraded by motion artifact. Visualized distal cervical segments of the internal carotid arteries are patent with antegrade flow. Petrous segments patent bilaterally. Probable mild atheromatous change within the carotid siphons without hemodynamically significant stenosis. 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 Small-vessel again seen coursing from the tip, consistent with a small vascular infundibulum (series 1, image 75). This is stable from prior. A1 segments widely patent. Normal anterior communicating artery complex. Anterior cerebral arteries patent without high-grade stenosis. No M1 stenosis or occlusion. Normal MCA bifurcations. Distal MCA branches perfused  and symmetric. POSTERIOR CIRCULATION: Visualized vertebral arteries patent without stenosis. Left vertebral artery dominant. Both PICA origins are patent and normal. Basilar patent without stenosis. Superior cerebellar arteries patent bilaterally. Both PCAs primarily supplied via the basilar well perfused to their distal aspects. No other aneurysm or vascular abnormality seen about the intracranial circulation. IMPRESSION: MRI HEAD IMPRESSION: 1. Technically limited exam due to patient's inability to tolerate the full length of the study and motion artifact. 2. 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 No significant surrounding edema. 3. No other new acute intracranial infarct or other abnormality. MRA HEAD IMPRESSION: 1. Motion degraded exam. 2. Stable appearance of the intracranial circulation with no large vessel occlusion or hemodynamically significant stenosis. 3. 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 No aneurysm or other vascular abnormality. Please note that while an MRA of the neck was also ordered as a  part of this exam, the patient was unable to tolerate the full length of the study. Consider re-attempt when the patient is able to tolerate. Alternatively, carotid Doppler ultrasound of the neck could be performed as well. Electronically Signed   By: Rise Mu M.D.   On: 06/14/2021 20:42   MR BRAIN WO CONTRAST Result Date: 06/14/2021 CLINICAL DATA:  Initial evaluation for neuro deficit, stroke suspected. Transient right hand weakness. EXAM: MRI HEAD WITHOUT CONTRAST MRA HEAD WITHOUT CONTRAST TECHNIQUE: Multiplanar, multiecho pulse sequences of the brain and surrounding structures were obtained without intravenous contrast. Angiographic images of the Circle of Willis were obtained using MRA technique without intravenous contrast. Angiographic images of the neck were obtained using MRA technique without intravenous contrast. Carotid stenosis measurements (when applicable) are obtained utilizing NASCET criteria, using the distal internal carotid diameter as the denominator. COMPARISON:  Prior CT from earlier the same day as well as previous MRI from 03/14/2021. FINDINGS: MRI HEAD FINDINGS Brain: Examination degraded by motion artifact. Additionally, patient was unable to tolerate the full length of the study. Age-related cerebral atrophy again noted. Underlying minor chronic microvascular ischemic disease for age. There has been continued interval evolution of previously identified intraparenchymal hemorrhage at the posterior left frontal vertex, subacute in appearance with associated restricted diffusion. Hematoma has decreased in size and partially contracted now measuring 1.9 x 0.9 cm (series 5, image 41). No significant surrounding edema. Residual 9 mm focus of susceptibility artifact at site of previously seen right periatrial hemorrhage (series 13, image 31). No other new acute intracranial hemorrhage evident on this motion degraded exam. No evidence for acute or subacute ischemia elsewhere.  Gray-white matter differentiation otherwise maintained. No other mass lesion, mass effect or midline shift. No hydrocephalus or extra-axial fluid collection. Pituitary gland suprasellar region within normal limits. Midline structures intact. Vascular: Major intracranial vascular flow voids are maintained at the skull base. Skull and upper cervical spine: Craniocervical junction within normal limits. Bone marrow signal intensity normal. No scalp soft tissue abnormality. Sinuses/Orbits: Globes orbital soft tissues within normal limits. Paranasal sinuses are largely clear. No significant mastoid effusion. Inner ear structures grossly normal. Other: None. MRA HEAD FINDINGS ANTERIOR CIRCULATION: Examination degraded by motion artifact. Visualized distal cervical segments of the internal carotid arteries are patent with antegrade flow. Petrous segments patent bilaterally. Probable mild atheromatous change within the carotid siphons without hemodynamically significant stenosis. 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 Small-vessel again seen coursing from the tip, consistent  with a small vascular infundibulum (series 1, image 75). This is stable from prior. A1 segments widely patent. Normal anterior communicating artery complex. Anterior cerebral arteries patent without high-grade stenosis. No M1 stenosis or occlusion. Normal MCA bifurcations. Distal MCA branches perfused and symmetric. POSTERIOR CIRCULATION: Visualized vertebral arteries patent without stenosis. Left vertebral artery dominant. Both PICA origins are patent and normal. Basilar patent without stenosis. Superior cerebellar arteries patent bilaterally. Both PCAs primarily supplied via the basilar well perfused to their distal aspects. No other aneurysm or vascular abnormality seen about the intracranial circulation.  IMPRESSION: MRI HEAD IMPRESSION: 1. Technically limited exam due to patient's inability to  tolerate the full length of the study and motion artifact. 2. 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 No significant surrounding edema. 3. No other new acute intracranial infarct or other abnormality. MRA HEAD IMPRESSION: 1. Motion degraded exam. 2. Stable appearance of the intracranial circulation with no large vessel occlusion or hemodynamically significant stenosis. 3. 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 No aneurysm or other vascular abnormality. Please note that while an MRA of the neck was also ordered as a part of this exam, the patient was unable to tolerate the full length of the study. Consider re-attempt when the patient is able to tolerate. Alternatively, carotid Doppler ultrasound of the neck could be performed as well. Electronically Signed   By: Rise Mu M.D.   On: 06/14/2021 20:42    EKG: Independently reviewed.  Normal sinus rhythm as above with no ST-T wave changes.  Echocardiogram 02/2021: IMPRESSIONS :  1. Left ventricular ejection fraction, by estimation, is 60 to 65%. The  left ventricle has normal function. The left ventricle has no regional  wall motion abnormalities. There is mild left ventricular hypertrophy.  Left ventricular diastolic parameters  are consistent with Grade III diastolic dysfunction (restrictive).  Elevated left atrial pressure.   2. Right ventricular systolic function is normal. The right ventricular  size is normal. There is severely elevated pulmonary artery systolic  pressure. The estimated right ventricular systolic pressure is 77.3 mmHg.   3. Left atrial size was mildly dilated.   4. The mitral valve is normal in structure. Trivial mitral valve  regurgitation. No evidence of mitral stenosis.   5. Tricuspid valve regurgitation is moderate.   6. The aortic valve is tricuspid. Aortic valve  regurgitation is not  visualized. No aortic stenosis is present.   7. The inferior vena cava is normal in size with greater than 50%  respiratory variability, suggesting right atrial pressure of 3 mmHg.    Assessment/Plan Principal Problem:   Right hand weakness Active Problems:   Essential hypertension   Anxiety   History of depression   Other specified hypothyroidism Right hand weakness: Differentials include TIA, tunnel, arthritis affecting dexterity and strength in grip strength, degenerative disc disease affecting C-spine or spinal stenosis. Patient admitted for MRI and a transesophageal echocardiogram.  MRI reported negative for any acute or new changes, patient's hematoma's-interval evolution of recently identified intraparenchymal hematomas at the posterior left frontal vertex and right periatrial region. We will continue patient on aspirin, statin, aspiration, fall precautions, ambulation with assistance.  Physical therapy, Occupational Therapy, speech therapy. Again blood pressure goal as outlined above is 140s to 160s systolic. We will continue patient on amlodipine and hydralazine.  Hypertension Blood pressure (!) 151/77, pulse 82, temperature 97.7 F (36.5 C), temperature source Oral, resp.  rate (!) 21, height 5\' 6"  (1.676 m), weight 65.1 kg, SpO2 94 %. Have increased amlodipine to 10 mg, we will keep hydralazine 10 mg per systolic blood pressure 160 and above.  Restarting lisinopril once home med rec is available.  We will restart lisinopril at half the dose to prevent hypotension and worsening of the stroke.  Anxiety/history of depression We will continue patient on sertraline 100 mg daily.  Acquired hypothyroidism: We will continue patient on levothyroxine 25 mcg.   DVT prophylaxis:  SCD's    Code Status:  Full code    Family Communication:  (Son)  [phone] (Mobile)   Disposition Plan:  Home    Consults called:  Dr.stack  -Neurology  Admission status: Inpatient     [phone] MD [facility] [phone] How to contact the [facility] Attending or Consulting provider 7A - 7P or covering provider during after hours 7P -7A, for this patient.    Check the care team in [facility] and look for a) attending/consulting [facility] provider listed and b) the [facility] team listed Log into [email or web address] and use [place]'s universal password to access. If you do not have the password, please contact the hospital operator. Locate the [facility] provider you are looking for under [facility] and page to a number that you can be directly reached. If you still have difficulty reaching the provider, please page the [facility] (Director on Call) for the Hospitalists listed on amion for assistance. [email or web address] Password [email or web address] 06/15/2021, 12:08 AM

## 2021-06-15 ENCOUNTER — Inpatient Hospital Stay: Payer: Medicare HMO

## 2021-06-15 ENCOUNTER — Encounter: Payer: Self-pay | Admitting: Internal Medicine

## 2021-06-15 DIAGNOSIS — I161 Hypertensive emergency: Secondary | ICD-10-CM | POA: Diagnosis present

## 2021-06-15 DIAGNOSIS — R29898 Other symptoms and signs involving the musculoskeletal system: Secondary | ICD-10-CM | POA: Diagnosis not present

## 2021-06-15 DIAGNOSIS — Z8673 Personal history of transient ischemic attack (TIA), and cerebral infarction without residual deficits: Secondary | ICD-10-CM | POA: Diagnosis not present

## 2021-06-15 DIAGNOSIS — Z23 Encounter for immunization: Secondary | ICD-10-CM | POA: Diagnosis present

## 2021-06-15 DIAGNOSIS — R262 Difficulty in walking, not elsewhere classified: Secondary | ICD-10-CM | POA: Diagnosis present

## 2021-06-15 DIAGNOSIS — F419 Anxiety disorder, unspecified: Secondary | ICD-10-CM | POA: Diagnosis present

## 2021-06-15 DIAGNOSIS — R531 Weakness: Secondary | ICD-10-CM | POA: Diagnosis present

## 2021-06-15 DIAGNOSIS — Z87891 Personal history of nicotine dependence: Secondary | ICD-10-CM | POA: Diagnosis not present

## 2021-06-15 DIAGNOSIS — R410 Disorientation, unspecified: Secondary | ICD-10-CM | POA: Diagnosis not present

## 2021-06-15 DIAGNOSIS — E785 Hyperlipidemia, unspecified: Secondary | ICD-10-CM | POA: Diagnosis present

## 2021-06-15 DIAGNOSIS — G459 Transient cerebral ischemic attack, unspecified: Secondary | ICD-10-CM | POA: Diagnosis present

## 2021-06-15 DIAGNOSIS — Z8659 Personal history of other mental and behavioral disorders: Secondary | ICD-10-CM | POA: Diagnosis not present

## 2021-06-15 DIAGNOSIS — R946 Abnormal results of thyroid function studies: Secondary | ICD-10-CM | POA: Diagnosis present

## 2021-06-15 DIAGNOSIS — I129 Hypertensive chronic kidney disease with stage 1 through stage 4 chronic kidney disease, or unspecified chronic kidney disease: Secondary | ICD-10-CM | POA: Diagnosis present

## 2021-06-15 DIAGNOSIS — Z9114 Patient's other noncompliance with medication regimen: Secondary | ICD-10-CM | POA: Diagnosis not present

## 2021-06-15 DIAGNOSIS — R451 Restlessness and agitation: Secondary | ICD-10-CM | POA: Diagnosis not present

## 2021-06-15 DIAGNOSIS — I639 Cerebral infarction, unspecified: Secondary | ICD-10-CM | POA: Diagnosis present

## 2021-06-15 DIAGNOSIS — E038 Other specified hypothyroidism: Secondary | ICD-10-CM | POA: Diagnosis present

## 2021-06-15 DIAGNOSIS — F05 Delirium due to known physiological condition: Secondary | ICD-10-CM | POA: Diagnosis not present

## 2021-06-15 DIAGNOSIS — Z79899 Other long term (current) drug therapy: Secondary | ICD-10-CM | POA: Diagnosis not present

## 2021-06-15 DIAGNOSIS — R001 Bradycardia, unspecified: Secondary | ICD-10-CM | POA: Diagnosis present

## 2021-06-15 DIAGNOSIS — Z823 Family history of stroke: Secondary | ICD-10-CM | POA: Diagnosis not present

## 2021-06-15 DIAGNOSIS — F32A Depression, unspecified: Secondary | ICD-10-CM | POA: Diagnosis present

## 2021-06-15 DIAGNOSIS — Z20822 Contact with and (suspected) exposure to covid-19: Secondary | ICD-10-CM | POA: Diagnosis present

## 2021-06-15 DIAGNOSIS — N1831 Chronic kidney disease, stage 3a: Secondary | ICD-10-CM | POA: Diagnosis present

## 2021-06-15 DIAGNOSIS — Z8249 Family history of ischemic heart disease and other diseases of the circulatory system: Secondary | ICD-10-CM | POA: Diagnosis not present

## 2021-06-15 LAB — HEMOGLOBIN A1C
Hgb A1c MFr Bld: 5.2 % (ref 4.8–5.6)
Mean Plasma Glucose: 102.54 mg/dL

## 2021-06-15 LAB — LIPID PANEL
Cholesterol: 323 mg/dL — ABNORMAL HIGH (ref 0–200)
HDL: 59 mg/dL (ref >40)
LDL Cholesterol: 216 mg/dL — ABNORMAL HIGH (ref 0–99)
Total CHOL/HDL Ratio: 5.5 RATIO
Triglycerides: 239 mg/dL — ABNORMAL HIGH (ref <150)
VLDL: 48 mg/dL — ABNORMAL HIGH (ref 0–40)

## 2021-06-15 MED ORDER — ATORVASTATIN CALCIUM 20 MG PO TABS
80.0000 mg | ORAL_TABLET | Freq: Every day | ORAL | Status: DC
  Administered 2021-06-15 – 2021-06-22 (×8): 80 mg via ORAL
  Filled 2021-06-15 (×8): qty 4

## 2021-06-15 MED ORDER — SERTRALINE HCL 50 MG PO TABS
100.0000 mg | ORAL_TABLET | Freq: Every day | ORAL | Status: DC
  Administered 2021-06-15 – 2021-06-22 (×8): 100 mg via ORAL
  Filled 2021-06-15 (×8): qty 2

## 2021-06-15 MED ORDER — TRAZODONE HCL 50 MG PO TABS
200.0000 mg | ORAL_TABLET | Freq: Every evening | ORAL | Status: DC | PRN
  Administered 2021-06-15 – 2021-06-21 (×8): 200 mg via ORAL
  Filled 2021-06-15: qty 2
  Filled 2021-06-15: qty 4
  Filled 2021-06-15: qty 2
  Filled 2021-06-15 (×5): qty 4

## 2021-06-15 MED ORDER — LEVOTHYROXINE SODIUM 25 MCG PO TABS
25.0000 ug | ORAL_TABLET | Freq: Every day | ORAL | Status: DC
  Administered 2021-06-17 – 2021-06-22 (×6): 25 ug via ORAL
  Filled 2021-06-15 (×6): qty 1

## 2021-06-15 MED ORDER — AMLODIPINE BESYLATE 10 MG PO TABS
10.0000 mg | ORAL_TABLET | Freq: Every day | ORAL | Status: DC
  Administered 2021-06-16 – 2021-06-22 (×7): 10 mg via ORAL
  Filled 2021-06-15: qty 2
  Filled 2021-06-15 (×6): qty 1

## 2021-06-15 MED ORDER — HALOPERIDOL LACTATE 5 MG/ML IJ SOLN
5.0000 mg | Freq: Four times a day (QID) | INTRAMUSCULAR | Status: DC | PRN
  Administered 2021-06-15 – 2021-06-16 (×2): 5 mg via INTRAVENOUS
  Filled 2021-06-15 (×2): qty 1

## 2021-06-15 MED ORDER — STROKE: EARLY STAGES OF RECOVERY BOOK: Freq: Once | Status: AC

## 2021-06-15 MED ORDER — HYDRALAZINE HCL 50 MG PO TABS
25.0000 mg | ORAL_TABLET | Freq: Four times a day (QID) | ORAL | Status: DC | PRN
  Administered 2021-06-15 – 2021-06-19 (×4): 25 mg via ORAL
  Filled 2021-06-15 (×4): qty 1

## 2021-06-15 MED ORDER — AMLODIPINE BESYLATE 5 MG PO TABS
10.0000 mg | ORAL_TABLET | Freq: Once | ORAL | Status: AC
  Administered 2021-06-15: 10 mg via ORAL
  Filled 2021-06-15: qty 2

## 2021-06-15 MED ORDER — HYDRALAZINE HCL 20 MG/ML IJ SOLN: 10.0000 mg | Freq: Four times a day (QID) | INTRAMUSCULAR | Status: DC | PRN

## 2021-06-15 MED ORDER — ACETAMINOPHEN 325 MG PO TABS
650.0000 mg | ORAL_TABLET | Freq: Four times a day (QID) | ORAL | Status: DC | PRN
  Administered 2021-06-15 – 2021-06-22 (×6): 650 mg via ORAL
  Filled 2021-06-15 (×7): qty 2

## 2021-06-15 MED ORDER — LORAZEPAM 1 MG PO TABS
1.0000 mg | ORAL_TABLET | ORAL | Status: DC | PRN
  Administered 2021-06-15: 1 mg via ORAL
  Filled 2021-06-15: qty 1

## 2021-06-15 MED ORDER — ENOXAPARIN SODIUM 40 MG/0.4ML IJ SOSY
40.0000 mg | PREFILLED_SYRINGE | Freq: Every day | INTRAMUSCULAR | Status: DC
  Administered 2021-06-17 – 2021-06-22 (×6): 40 mg via SUBCUTANEOUS
  Filled 2021-06-15 (×8): qty 0.4

## 2021-06-15 NOTE — ED Notes (Signed)
US at the bedside

## 2021-06-15 NOTE — ED Notes (Signed)
Jenna Powers came out of the room to ask for assistance with the patient - the patient was threatening to leave - stating shes "gonna crack up" if shes made to stay here against her will. Pt was educated on the importance of remaining in the hospital to evaluate her BP and ADL performance. MD ([place]) made aware of the patients behavior and the requests to leave.

## 2021-06-15 NOTE — ED Notes (Signed)
Pt ambulatory to the restroom with a walker and sitter.

## 2021-06-15 NOTE — Consult Note (Signed)
ANTICOAGULATION CONSULT NOTE - Initial Consult  Pharmacy Consult for enoxaparin  Indication: VTE prophylaxis  Allergies  Allergen Reactions   Codeine Other (See Comments)    Pt reports allergy but does not remember what it was    Patient Measurements: Height: 5\' 6"  (167.6 cm) Weight: 65.1 kg (143 lb 8.3 oz) IBW/kg (Calculated) : 59.3   Vital Signs: BP: 156/78 (10/27 0600) Pulse Rate: 63 (10/27 0600)  Labs: Recent Labs    06/14/21 1619  HGB 13.3  HCT 40.0  PLT 296  APTT 29  LABPROT 13.3  INR 1.0  CREATININE 1.16*  TROPONINIHS 8    Estimated Creatinine Clearance: 35 mL/min (A) (by C-G formula based on SCr of 1.16 mg/dL (H)).   Medical History: Past Medical History:  Diagnosis Date   Depression    Hyperlipemia    Hypertension    Neuropathy    Stroke [facility])    Thyroid disease    Tremor     Medications:  ASA 81 mg daily  Assessment: 80 y.o. female seen in ed with complaints of right hand weakness. Patient with PMH of intracranial hemorrhage -- stable per MRI. Pharmacy has been consulted for enoxaparin for DVT ppx   Goal of Therapy:  Monitor platelets by anticoagulation protocol: Yes   Plan:  Lovenox 40 mg SQ daily  CBC/Scr at least weekly   97, PharmD, BCPS Clinical Pharmacist   06/15/2021,10:41 AM

## 2021-06-15 NOTE — ED Notes (Signed)
Night shift coverage provider Manuela Schwartz, NP) contacted regarding to the patients earlier behavior and how we should proceed with effective and safe care for all persons involved. MD Para March) also contacted to discuss the plan of care - see MD note for care plan.
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
## 2021-06-15 NOTE — ED Notes (Signed)
Pt provided a meal tray from dietary. 
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
## 2021-06-15 NOTE — Progress Notes (Signed)
Cross Cover Informed by Jenna Powers regarding patient with agitated combative behaviors and wanting to leave AMA, as well as screening positive for homicidal ideation.  Spoke to one of her sons Jenna Powers regarding his mother wanting to leave and we felt it was unsafe for her to do so.  Was un able to connect with the first son listed, but Jenna Powers was going to try to get a hold of him once he reached his destination Bedside the patient told me she was scared and wanted to leave.  Discussed with her our concerns of her safety if she left by herself as she is very weak and high fall risk.  She said she has a cane at home.  Discussed further and offered more reassurance that she was safe.  She agreed to stay and take her night time meds.  Due to high risk for paradoxical reaction with ativan, the med was discontinued Updated Dr Para March who has consulted behavioral medicine for further evaluation of the patient.

## 2021-06-15 NOTE — ED Notes (Signed)
Several attempts made by this RN to contact the patients two sons (Jenna Powers & Jenna Powers) - all have been unsuccessful.

## 2021-06-15 NOTE — Progress Notes (Addendum)
PROGRESS NOTE    Jenna Powers  ZOX:[id number] DOB: June 09, 1941 DOA: 06/14/2021 PCP: Dortha Kern, MD    Brief Narrative:  Jenna Powers is an 80 year old female with past medical history significant for essential hypertension, anxiety/depression, hypothyroidism, CKD3a, tobacco use disorder, CVA, cognitive impairment, osteoarthritis, history of intracranial hemorrhage, Hx EtoH abuse who presented to [facility] ED via EMS on 10/26 for evaluation of weakness in her right hand.  Onset around 1:00 PM when she was reaching for something in her cabinet, lasted roughly 15-30 minutes with spontaneous resolution.  Denies associated headaches, not on blood thinners at baseline.  Denies chest pain, no fever/chills/night sweats, no nausea/vomiting/diarrhea.  On arrival, BP noted to be 230/130 per EMS.  In the ED, BP 227/115, temperature 97.7 F, HR 72, RR 16, SPO2 96% on room air.  Sodium 136, potassium 3.9, chloride 103, CO2 26, glucose 97, BUN 13, creatinine 1.16, AST 30, ALT 20, total bilirubin 0.9.  High sensitive troponin 8, WBC 7.7, hemoglobin 13.3, platelets 296.  COVID-19 PCR negative.  Influenza A/B PCR negative.  Urinalysis unrevealing.  EtOH level less than 10.  UDS negative.  CT head without contrast with no evidence of acute intracranial abnormality; persistent but decreased hypodensity site of a previous parenchymal hemorrhage posterior left frontal lobe, mild generalized parenchymal atrophy, chronic small vessel ischemic changes cerebral right matter.  Neurology was consulted; and recommended admission to the hospital service for further blood pressure control, neuroimaging and neuro consultation.  EDP consulted [facility] for further evaluation and management.   Assessment & Plan:   Principal Problem:   Right hand weakness Active Problems:   Essential hypertension   Anxiety   History of depression   Other specified hypothyroidism   Cerebral infarction (HCC)   Transient ischemic attack Hx  CVA/intracranial hemorrhage Patient presenting to ED with right hand weakness lasting 15-30 minutes that has spontaneously resolved.  Patient was noted to be severely hypertensive on admission, notable for recent intraparenchymal hemorrhage.  UDS negative.  Unlikely infectious source with no leukocytosis and urinalysis unrevealing.  EtOH level less than 10. CT head without contrast with no evidence of acute intracranial abnormality; persistent but decreased hypodensity site of a previous parenchymal hemorrhage posterior left frontal lobe, mild generalized parenchymal atrophy, chronic small vessel ischemic changes cerebral right matter.  MR brain without contrast no new acute intracranial infarct or other abnormality.  MRA head motion degraded with no large vessel occlusion, aneurysm or vascular abnormality appreciated.  Hemoglobin A1c 5.2.  LDL 216. --Neurology following, appreciate assistance --TTE: Pending --Carotid ultrasound: Pending --Aspirin 80 mg p.o. daily --Increase atorvastatin to 80 mg p.o. daily --PT/OT recommends SNF placement, TOC for evaluation  Hypertensive emergency On admission, BP noted to be 227/115, poorly controlled.  Likely contributing factor to her weakness as above.  Suspect medication noncompliance outpatient. --Amlodipine 10 mg p.o. daily --Hydralazine  PO q6h prn SBP >160 or DBP >110 --continue to monitor BP closely and adjust and hypertensives as necessary  Hyperlipidemia: Total cholesterol 323, HDL 59, LDL 216, triglycerides 239. --Increase atorvastatin to 80 mg p.o. daily  CKD stage IIIa Baseline creatinine 1.1-1.2, stable. --Avoid nephrotoxins, renal dose all medications  Anxiety/depression: --Sertraline 100 mg p.o. daily --Ativan 1 mg p.o. q4h prn anxiety/agitation  Hypothyroidism: TSH elevated 6.526 on 09/10/2019.  Suspect medication noncompliance. --Repeat TSH --Levothyroxine 25 mcg p.o. daily  History of tobacco use disorder and alcohol use  disorder: Counseled on need for complete cessation.   DVT prophylaxis: enoxaparin (LOVENOX) injection 40  mg Start: 06/15/21 1115   Code Status: Prior Family Communication: No family present at bedside this morning  Disposition Plan:  Level of care: Med-Surg Status is: Inpatient  Remains inpatient appropriate because: Continue neurological work-up, PT/OT recommends SNF placement as patient unsafe to discharge back home alone.    Consultants:  Neurology  Procedures:  TTE Carotid duplex ultrasound  Antimicrobials:  None   Subjective: Patient seen examined bedside, resting comfortably.  Requesting to go home.  Seen by PT/OT with high concerns of returning home alone is likely unsafe.  Reviewed previous hospitalization records with living alone and was previously found in July on the floor of her home covered in urine and feces.  Recently returned home from assisted living.  Discussed with patient that neurology wants to continue to work-up her extremity weakness.  Currently continues with resolution of her right arm weakness.  No other complaints or concerns at this time.  Denies headache, no visual changes, no chest pain, palpitations, no shortness of breath, no abdominal pain, no weakness, no fatigue, no paresthesias.  No acute events overnight per nursing staff.  Objective: Vitals:   06/15/21 0058 06/15/21 0230 06/15/21 0323 06/15/21 0600  BP:   (!) 136/97 (!) 156/78  Pulse: 70 63 62 63  Resp: 17 16 18 16   Temp:      TempSrc:      SpO2: 95% 92% 97% 100%  Weight:      Height:       No intake or output data in the 24 hours ending 06/15/21 1508 Filed Weights   06/14/21 1607  Weight: 65.1 kg    Examination:  General exam: Appears calm and comfortable  Respiratory system: Clear to auscultation. Respiratory effort normal.  On room air Cardiovascular system: S1 & S2 heard, RRR. No JVD, murmurs, rubs, gallops or clicks. No pedal edema. Gastrointestinal system: Abdomen is  nondistended, soft and nontender. No organomegaly or masses felt. Normal bowel sounds heard. Central nervous system: Alert and oriented. No focal neurological deficits. Extremities: Symmetric 5 x 5 power. Skin: No rashes, lesions or ulcers Psychiatry: Judgement and insight appear normal. Mood & affect appropriate.     Data Reviewed: I have personally reviewed following labs and imaging studies  CBC: Recent Labs  Lab 06/14/21 1619  WBC 7.7  NEUTROABS 4.9  HGB 13.3  HCT 40.0  MCV 92.4  PLT 296   Basic Metabolic Panel: Recent Labs  Lab 06/14/21 1619  NA 136  K 3.9  CL 103  CO2 26  GLUCOSE 97  BUN 13  CREATININE 1.16*  CALCIUM 9.3   GFR: Estimated Creatinine Clearance: 35 mL/min (A) (by C-G formula based on SCr of 1.16 mg/dL (H)). Liver Function Tests: Recent Labs  Lab 06/14/21 1619  AST 30  ALT 20  ALKPHOS 87  BILITOT 0.9  PROT 6.5  ALBUMIN 3.7   No results for input(s): LIPASE, AMYLASE in the last 168 hours. No results for input(s): AMMONIA in the last 168 hours. Coagulation Profile: Recent Labs  Lab 06/14/21 1619  INR 1.0   Cardiac Enzymes: No results for input(s): CKTOTAL, CKMB, CKMBINDEX, TROPONINI in the last 168 hours. BNP (last 3 results) No results for input(s): PROBNP in the last 8760 hours. HbA1C: Recent Labs    06/15/21 0600  HGBA1C 5.2   CBG: No results for input(s): GLUCAP in the last 168 hours. Lipid Profile: Recent Labs    06/15/21 0600  CHOL 323*  HDL 59  LDLCALC 216*  TRIG 239*  CHOLHDL 5.5   Thyroid Function Tests: No results for input(s): TSH, T4TOTAL, FREET4, T3FREE, THYROIDAB in the last 72 hours. Anemia Panel: No results for input(s): VITAMINB12, FOLATE, FERRITIN, TIBC, IRON, RETICCTPCT in the last 72 hours. Sepsis Labs: No results for input(s): PROCALCITON, LATICACIDVEN in the last 168 hours.  Recent Results (from the past 240 hour(s))  Resp Panel by RT-PCR (Flu A&B, Covid) Nasopharyngeal Swab     Status: None    Collection Time: 06/14/21  5:51 PM   Specimen: Nasopharyngeal Swab; Nasopharyngeal(NP) swabs in vial transport medium  Result Value Ref Range Status   SARS Coronavirus 2 by RT PCR NEGATIVE NEGATIVE Final    Comment: (NOTE) SARS-CoV-2 target nucleic acids are NOT DETECTED.  The SARS-CoV-2 RNA is generally detectable in upper respiratory specimens during the acute phase of infection. The lowest concentration of SARS-CoV-2 viral copies this assay can detect is 138 copies/mL. A negative result does not preclude SARS-Cov-2 infection and should not be used as the sole basis for treatment or other patient management decisions. A negative result may occur with  improper specimen collection/handling, submission of specimen other than nasopharyngeal swab, presence of viral mutation(s) within the areas targeted by this assay, and inadequate number of viral copies(<138 copies/mL). A negative result must be combined with clinical observations, patient history, and epidemiological information. The expected result is Negative.  Fact Sheet for Patients:  BloggerCourse.com  Fact Sheet for Healthcare Providers:  SeriousBroker.it  This test is no t yet approved or cleared by the [place] FDA and  has been authorized for detection and/or diagnosis of SARS-CoV-2 by FDA under an Emergency Use Authorization (EUA). This EUA will remain  in effect (meaning this test can be used) for the duration of the COVID-19 declaration under Section 564(b)(1) of the Act, 21 U.S.C.section 360bbb-3(b)(1), unless the authorization is terminated  or revoked sooner.       Influenza A by PCR NEGATIVE NEGATIVE Final   Influenza B by PCR NEGATIVE NEGATIVE Final    Comment: (NOTE) The Xpert Xpress SARS-CoV-2/FLU/RSV plus assay is intended as an aid in the diagnosis of influenza from Nasopharyngeal swab specimens and should not be used as a sole basis for treatment.  Nasal washings and aspirates are unacceptable for Xpert Xpress SARS-CoV-2/FLU/RSV testing.  Fact Sheet for Patients: BloggerCourse.com  Fact Sheet for Healthcare Providers: SeriousBroker.it  This test is not yet approved or cleared by the [place] FDA and has been authorized for detection and/or diagnosis of SARS-CoV-2 by FDA under an Emergency Use Authorization (EUA). This EUA will remain in effect (meaning this test can be used) for the duration of the COVID-19 declaration under Section 564(b)(1) of the Act, 21 U.S.C. section 360bbb-3(b)(1), unless the authorization is terminated or revoked.  Performed at [facility], [address] [place], [place] [address]          Radiology Studies: CT HEAD WO CONTRAST  Result Date: 06/14/2021 CLINICAL DATA:  Transient ischemic attack. Additional history provided: Weak grip. EXAM: CT HEAD WITHOUT CONTRAST TECHNIQUE: Contiguous axial images were obtained from the base of the skull through the vertex without intravenous contrast. COMPARISON:  Prior head CT examinations 04/18/2021 and earlier. FINDINGS: Brain: Mild cerebral and cerebellar atrophy. Persistent although decreased hypodensity at site of a previous parenchymal hemorrhage within the posterior left frontal lobe, compatible with gliosis and possible mild residual edema. Background minimal patchy and ill-defined hypoattenuation within the cerebral white matter, nonspecific but compatible with chronic small vessel ischemic disease. There is  no acute intracranial hemorrhage. No demarcated cortical infarct. No extra-axial fluid collection. No evidence of an intracranial mass. No midline shift. Vascular: No hyperdense vessel.  Atherosclerotic calcifications Skull: Normal. Negative for fracture or focal lesion. Sinuses/Orbits: Visualized orbits show no acute finding. Trace mucosal thickening within the bilateral ethmoid sinuses.  IMPRESSION: No evidence of acute intracranial abnormality. Persistent although decreased hypodensity at site of a previous parenchymal hemorrhage within the posterior left frontal lobe. This is compatible with gliosis and possible mild residual edema. Background minimal chronic small-vessel ischemic changes within the cerebral white matter. Mild generalized parenchymal atrophy. Electronically Signed   By: Jackey Loge D.O.   On: 06/14/2021 16:54   MR ANGIO HEAD WO CONTRAST  Result Date: 06/14/2021 CLINICAL DATA:  Initial evaluation for neuro deficit, stroke suspected. Transient right hand weakness. EXAM: MRI HEAD WITHOUT CONTRAST MRA HEAD WITHOUT CONTRAST TECHNIQUE: Multiplanar, multiecho pulse sequences of the brain and surrounding structures were obtained without intravenous contrast. Angiographic images of the Circle of Willis were obtained using MRA technique without intravenous contrast. Angiographic images of the neck were obtained using MRA technique without intravenous contrast. Carotid stenosis measurements (when applicable) are obtained utilizing NASCET criteria, using the distal internal carotid diameter as the denominator. COMPARISON:  Prior CT from earlier the same day as well as previous MRI from 03/14/2021. FINDINGS: MRI HEAD FINDINGS Brain: Examination degraded by motion artifact. Additionally, patient was unable to tolerate the full length of the study. Age-related cerebral atrophy again noted. Underlying minor chronic microvascular ischemic disease for age. There has been continued interval evolution of previously identified intraparenchymal hemorrhage at the posterior left frontal vertex, subacute in appearance with associated restricted diffusion. Hematoma has decreased in size and partially contracted now measuring 1.9 x 0.9 cm (series 5, image 41). No significant surrounding edema. Residual 9 mm focus of susceptibility artifact at site of previously seen right periatrial hemorrhage (series  13, image 31). No other new acute intracranial hemorrhage evident on this motion degraded exam. No evidence for acute or subacute ischemia elsewhere. Gray-white matter differentiation otherwise maintained. No other mass lesion, mass effect or midline shift. No hydrocephalus or extra-axial fluid collection. Pituitary gland suprasellar region within normal limits. Midline structures intact. Vascular: Major intracranial vascular flow voids are maintained at the skull base. Skull and upper cervical spine: Craniocervical junction within normal limits. Bone marrow signal intensity normal. No scalp soft tissue abnormality. Sinuses/Orbits: Globes orbital soft tissues within normal limits. Paranasal sinuses are largely clear. No significant mastoid effusion. Inner ear structures grossly normal. Other: None. MRA HEAD FINDINGS ANTERIOR CIRCULATION: Examination degraded by motion artifact. Visualized distal cervical segments of the internal carotid arteries are patent with antegrade flow. Petrous segments patent bilaterally. Probable mild atheromatous change within the carotid siphons without hemodynamically significant stenosis. 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 Small-vessel again seen coursing from the tip, consistent with a small vascular infundibulum (series 1, image 75). This is stable from prior. A1 segments widely patent. Normal anterior communicating artery complex. Anterior cerebral arteries patent without high-grade stenosis. No M1 stenosis or occlusion. Normal MCA bifurcations. Distal MCA branches perfused and symmetric. POSTERIOR CIRCULATION: Visualized vertebral arteries patent without stenosis. Left vertebral artery dominant. Both PICA origins are patent and normal. Basilar patent without stenosis. Superior cerebellar arteries patent bilaterally. Both PCAs primarily supplied via the basilar well perfused to their distal aspects. No other aneurysm or  vascular abnormality seen about the intracranial circulation. IMPRESSION: MRI HEAD IMPRESSION:  1. Technically limited exam due to patient's inability to tolerate the full length of the study and motion artifact. 2. 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 No significant surrounding edema. 3. No other new acute intracranial infarct or other abnormality. MRA HEAD IMPRESSION: 1. Motion degraded exam. 2. Stable appearance of the intracranial circulation with no large vessel occlusion or hemodynamically significant stenosis. 3. 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 No aneurysm or other vascular abnormality. Please note that while an MRA of the neck was also ordered as a part of this exam, the patient was unable to tolerate the full length of the study. Consider re-attempt when the patient is able to tolerate. Alternatively, carotid Doppler ultrasound of the neck could be performed as well. Electronically Signed   By: Rise Mu M.D.   On: 06/14/2021 20:42   MR ANGIO NECK WO CONTRAST  Result Date: 06/14/2021 CLINICAL DATA:  Initial evaluation for neuro deficit, stroke suspected. Transient right hand weakness. EXAM: MRI HEAD WITHOUT CONTRAST MRA HEAD WITHOUT CONTRAST TECHNIQUE: Multiplanar, multiecho pulse sequences of the brain and surrounding structures were obtained without intravenous contrast. Angiographic images of the Circle of Willis were obtained using MRA technique without intravenous contrast. Angiographic images of the neck were obtained using MRA technique without intravenous contrast. Carotid stenosis measurements (when applicable) are obtained utilizing NASCET criteria, using the distal internal carotid diameter as the denominator. COMPARISON:  Prior CT from earlier the same day as well as previous MRI from 03/14/2021. FINDINGS: MRI HEAD FINDINGS  Brain: Examination degraded by motion artifact. Additionally, patient was unable to tolerate the full length of the study. Age-related cerebral atrophy again noted. Underlying minor chronic microvascular ischemic disease for age. There has been continued interval evolution of previously identified intraparenchymal hemorrhage at the posterior left frontal vertex, subacute in appearance with associated restricted diffusion. Hematoma has decreased in size and partially contracted now measuring 1.9 x 0.9 cm (series 5, image 41). No significant surrounding edema. Residual 9 mm focus of susceptibility artifact at site of previously seen right periatrial hemorrhage (series 13, image 31). No other new acute intracranial hemorrhage evident on this motion degraded exam. No evidence for acute or subacute ischemia elsewhere. Gray-white matter differentiation otherwise maintained. No other mass lesion, mass effect or midline shift. No hydrocephalus or extra-axial fluid collection. Pituitary gland suprasellar region within normal limits. Midline structures intact. Vascular: Major intracranial vascular flow voids are maintained at the skull base. Skull and upper cervical spine: Craniocervical junction within normal limits. Bone marrow signal intensity normal. No scalp soft tissue abnormality. Sinuses/Orbits: Globes orbital soft tissues within normal limits. Paranasal sinuses are largely clear. No significant mastoid effusion. Inner ear structures grossly normal. Other: None. MRA HEAD FINDINGS ANTERIOR CIRCULATION: Examination degraded by motion artifact. Visualized distal cervical segments of the internal carotid arteries are patent with antegrade flow. Petrous segments patent bilaterally. Probable mild atheromatous change within the carotid siphons without hemodynamically significant stenosis. 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 Small-vessel again seen coursing from the tip,  consistent with a small vascular infundibulum (series 1, image 75). This is stable from prior. A1 segments widely patent. Normal anterior communicating artery complex. Anterior cerebral arteries patent without high-grade stenosis. No M1 stenosis or occlusion. Normal MCA bifurcations. Distal MCA branches perfused and symmetric. POSTERIOR CIRCULATION: Visualized vertebral arteries patent without stenosis. Left vertebral artery dominant.  Both PICA origins are patent and normal. Basilar patent without stenosis. Superior cerebellar arteries patent bilaterally. Both PCAs primarily supplied via the basilar well perfused to their distal aspects. No other aneurysm or vascular abnormality seen about the intracranial circulation. IMPRESSION: MRI HEAD IMPRESSION: 1. Technically limited exam due to patient's inability to tolerate the full length of the study and motion artifact. 2. 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 No significant surrounding edema. 3. No other new acute intracranial infarct or other abnormality. MRA HEAD IMPRESSION: 1. Motion degraded exam. 2. Stable appearance of the intracranial circulation with no large vessel occlusion or hemodynamically significant stenosis. 3. 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 No aneurysm or other vascular abnormality. Please note that while an MRA of the neck was also ordered as a part of this exam, the patient was unable to tolerate the full length of the study. Consider re-attempt when the patient is able to tolerate. Alternatively, carotid Doppler ultrasound of the neck could be performed as well. Electronically Signed   By: Rise Mu M.D.   On: 06/14/2021 20:42   MR BRAIN WO CONTRAST  Result Date: 06/14/2021 CLINICAL DATA:  Initial evaluation for neuro deficit, stroke suspected. Transient right hand weakness.  EXAM: MRI HEAD WITHOUT CONTRAST MRA HEAD WITHOUT CONTRAST TECHNIQUE: Multiplanar, multiecho pulse sequences of the brain and surrounding structures were obtained without intravenous contrast. Angiographic images of the Circle of Willis were obtained using MRA technique without intravenous contrast. Angiographic images of the neck were obtained using MRA technique without intravenous contrast. Carotid stenosis measurements (when applicable) are obtained utilizing NASCET criteria, using the distal internal carotid diameter as the denominator. COMPARISON:  Prior CT from earlier the same day as well as previous MRI from 03/14/2021. FINDINGS: MRI HEAD FINDINGS Brain: Examination degraded by motion artifact. Additionally, patient was unable to tolerate the full length of the study. Age-related cerebral atrophy again noted. Underlying minor chronic microvascular ischemic disease for age. There has been continued interval evolution of previously identified intraparenchymal hemorrhage at the posterior left frontal vertex, subacute in appearance with associated restricted diffusion. Hematoma has decreased in size and partially contracted now measuring 1.9 x 0.9 cm (series 5, image 41). No significant surrounding edema. Residual 9 mm focus of susceptibility artifact at site of previously seen right periatrial hemorrhage (series 13, image 31). No other new acute intracranial hemorrhage evident on this motion degraded exam. No evidence for acute or subacute ischemia elsewhere. Gray-white matter differentiation otherwise maintained. No other mass lesion, mass effect or midline shift. No hydrocephalus or extra-axial fluid collection. Pituitary gland suprasellar region within normal limits. Midline structures intact. Vascular: Major intracranial vascular flow voids are maintained at the skull base. Skull and upper cervical spine: Craniocervical junction within normal limits. Bone marrow signal intensity normal. No scalp soft tissue  abnormality. Sinuses/Orbits: Globes orbital soft tissues within normal limits. Paranasal sinuses are largely clear. No significant mastoid effusion. Inner ear structures grossly normal. Other: None. MRA HEAD FINDINGS ANTERIOR CIRCULATION: Examination degraded by motion artifact. Visualized distal cervical segments of the internal carotid arteries are patent with antegrade flow. Petrous segments patent bilaterally. Probable mild atheromatous change within the carotid siphons without hemodynamically significant stenosis. 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 Small-vessel again seen coursing from the tip, consistent with a small vascular infundibulum (series 1, image 75). This is stable from  prior. A1 segments widely patent. Normal anterior communicating artery complex. Anterior cerebral arteries patent without high-grade stenosis. No M1 stenosis or occlusion. Normal MCA bifurcations. Distal MCA branches perfused and symmetric. POSTERIOR CIRCULATION: Visualized vertebral arteries patent without stenosis. Left vertebral artery dominant. Both PICA origins are patent and normal. Basilar patent without stenosis. Superior cerebellar arteries patent bilaterally. Both PCAs primarily supplied via the basilar well perfused to their distal aspects. No other aneurysm or vascular abnormality seen about the intracranial circulation. IMPRESSION: MRI HEAD IMPRESSION: 1. Technically limited exam due to patient's inability to tolerate the full length of the study and motion artifact. 2. 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 No significant surrounding edema. 3. No other new acute intracranial infarct or other abnormality. MRA HEAD IMPRESSION: 1. Motion degraded exam. 2. Stable appearance of the intracranial circulation with no large vessel occlusion or hemodynamically  significant stenosis. 3. 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 No aneurysm or other vascular abnormality. Please note that while an MRA of the neck was also ordered as a part of this exam, the patient was unable to tolerate the full length of the study. Consider re-attempt when the patient is able to tolerate. Alternatively, carotid Doppler ultrasound of the neck could be performed as well. Electronically Signed   By: Rise Mu M.D.   On: 06/14/2021 20:42        Scheduled Meds:   stroke: mapping our early stages of recovery book   Does not apply Once   aspirin EC  81 mg Oral Daily   atorvastatin  80 mg Oral Daily   enoxaparin (LOVENOX) injection  40 mg Subcutaneous Daily   levothyroxine  25 mcg Oral Q0600   Continuous Infusions:   LOS: 0 days    Time spent: 42 minutes spent on chart review, discussion with nursing staff, consultants, updating family and interview/physical exam; more than 50% of that time was spent in counseling and/or coordination of care.    Alvira Philips [place], DO [facility] Available via Epic secure chat 7am-7pm After these hours, please refer to coverage provider listed on [email or web address] 06/15/2021, 3:08 PM

## 2021-06-15 NOTE — ED Notes (Signed)
Pt provided a hospital bed to promote comfort.

## 2021-06-15 NOTE — ED Notes (Signed)
Pts behavior is much improved since her discussion with Cliffton Asters, NP & new sitter. Pt pleasant with this RN and compliant with taking her medications.

## 2021-06-15 NOTE — ED Notes (Signed)
Pt ambulated to bathroom x1 assist.  

## 2021-06-15 NOTE — ED Notes (Signed)
Pt was ambulatory with a walker to the restroom. She was walking the halls as she bypassed her room and was escorted by this RN back to her room and her posey alarm was set on the bed for safety. MD ([place]) notified of the patients new agitation about wanting to leave.

## 2021-06-15 NOTE — Progress Notes (Addendum)
SLP Cancellation Note  Patient Details Name: Jenna Powers MRN: [id number] DOB: June 23, 1941   Cancelled treatment:       Reason Eval/Treat Not Completed: SLP screened, no needs identified, will sign off (chart reviewed; consulted pt/NSG). Per chart notes, pt was recently seen by ST services 03/15/2021 at [facility] w/ no needs identified; No recommended services. Currently, pt denied any difficulty swallowing and is currently on a regular diet; tolerates swallowing pills w/ water per NSG. Pt conversed in conversation w/out overt expressive/receptive deficits noted; pt denied any new speech-language deficits. Speech clear. Noted per OT note, pt is assisted w/ few ADLs by her Sons -- no longer driving. OT has identified needs including Supervision at home d/t reduced safety awareness. ST services would agree w/ this d/t MRI results("Normal expected interval evolution of recently identified intraparenchymal hematomas at the posterior left frontal vertex and right periatrial region. No significant surrounding edema. No other new acute intracranial infarct or other abnormality"), and generalized weakened status.    No further skilled ST services indicated as pt appears at her baseline. Pt agreed. NSG to reconsult if any change in status while admitted.       Jerilynn Som, MS, CCC-SLP Speech Language Pathologist Rehab Services [phone] [facility] 06/15/2021, 4:37 PM
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
## 2021-06-16 ENCOUNTER — Encounter: Payer: Self-pay | Admitting: Internal Medicine

## 2021-06-16 ENCOUNTER — Inpatient Hospital Stay (HOSPITAL_COMMUNITY)
Admit: 2021-06-16 | Discharge: 2021-06-16 | Disposition: A | Discharge status: Home / Self Care | Payer: Medicare HMO | Attending: Internal Medicine | Admitting: Internal Medicine

## 2021-06-16 DIAGNOSIS — R451 Restlessness and agitation: Secondary | ICD-10-CM

## 2021-06-16 DIAGNOSIS — Z8659 Personal history of other mental and behavioral disorders: Secondary | ICD-10-CM

## 2021-06-16 DIAGNOSIS — G459 Transient cerebral ischemic attack, unspecified: Secondary | ICD-10-CM | POA: Insufficient documentation

## 2021-06-16 LAB — ECHOCARDIOGRAM COMPLETE BUBBLE STUDY
AR max vel: 1.78 cm2
AV Area VTI: 1.67 cm2
AV Area mean vel: 1.61 cm2
AV Mean grad: 4.5 mmHg
AV Peak grad: 7.1 mmHg
Ao pk vel: 1.34 m/s
Area-P 1/2: 3.28 cm2
MV VTI: 1.72 cm2
S' Lateral: 3 cm

## 2021-06-16 MED ORDER — OLANZAPINE 10 MG IM SOLR
2.5000 mg | Freq: Two times a day (BID) | INTRAMUSCULAR | Status: DC | PRN
  Filled 2021-06-16: qty 10

## 2021-06-16 MED ORDER — INFLUENZA VAC A&B SA ADJ QUAD 0.5 ML IM PRSY
0.5000 mL | PREFILLED_SYRINGE | INTRAMUSCULAR | Status: AC
  Administered 2021-06-19: 0.5 mL via INTRAMUSCULAR
  Filled 2021-06-16: qty 0.5

## 2021-06-16 MED ORDER — OLANZAPINE 2.5 MG PO TABS
2.5000 mg | ORAL_TABLET | Freq: Two times a day (BID) | ORAL | Status: DC | PRN
  Administered 2021-06-16: 22:00:00 2.5 mg via ORAL
  Filled 2021-06-16 (×3): qty 1

## 2021-06-16 MED ORDER — LOSARTAN POTASSIUM 25 MG PO TABS
25.0000 mg | ORAL_TABLET | Freq: Every day | ORAL | Status: DC
  Administered 2021-06-16: 25 mg via ORAL
  Filled 2021-06-16: qty 1
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
## 2021-06-16 NOTE — Progress Notes (Signed)
The patient's personal belongings are secured in a locker. The key is on the chart.

## 2021-06-16 NOTE — Consult Note (Signed)
[facility] Face-to-Face Psychiatry Consult   Reason for Consult:  Agitation and capacity Referring Physician:  EDP Patient Identification: Jenna Powers MRN:  [id number] Principal Diagnosis: Right hand weakness Diagnosis:  Principal Problem:   Right hand weakness Active Problems:   Essential hypertension   Anxiety   History of depression   Other specified hypothyroidism   Cerebral infarction (HCC)   Total Time spent with patient: 1.5 hours  Subjective:   Jenna Powers is a 80 y.o. female patient admitted with stroke concerns.  Psych consult for agitation and capacity.  "I thought I had a stroke."  HPI:  80 yo female presented to the ED with concerns about having a stroke.  She is irritable on most of the assessment and wants to return home.  She stated, "If I want to , I could just walk out of here and get a ride out there with somebody", no concern about this being dangerous and unsafe.  Denies suicidal/homicidal ideations, hallucinations, and hallucinations.  She is taking Zoloft for depression and Trazodone for sleep.  Reports she lives along and walks with a walker.  The sitter reports she is unable to walk with the walker without extensive assistance.  Then, the client stated she would just hold ont walks and walk.  She states her son has groceries delivered and she "just cooks."  However, she is unsteady on her feet at this time.  She was living in an assistant living until last month and left because she did not like it.  When asked about last night, she stated "They were trying to control me. I just wanted to go home."  According to her sitter, she suddenly go agitated last night started screaming and threatened to kill the nurses and herself.  When security came, she took off her clothes and tried to stand on the bed while stating, "do you want a feel of me."  Client does not remember this incident.  When asked about calling her family, she stated "I don't even know were they are.  I  don't know if they will talk to you."  Unfortunately, she has the desire to return home to live but does not recognize that she does not have the ability to care for herself.  At this time, she does not have capacity to make the decision to return home and needs a higher level of care, like a rehab or assisted living or SNF.    Past Psychiatric History: depression and anxiety  Risk to Self:  unable to care for herself Risk to Others:  none Prior Inpatient Therapy:  none Prior Outpatient Therapy:  none  Past Medical History:  Past Medical History:  Diagnosis Date   Depression    Hyperlipemia    Hypertension    Neuropathy    Stroke (HCC)    Thyroid disease    Tremor     Past Surgical History:  Procedure Laterality Date   ABDOMINAL HYSTERECTOMY     ANKLE SURGERY Right    Family History:  Family History  Problem Relation Age of Onset   Pneumonia Mother    Hypertension Father    Heart disease Father    Stroke Father    Family Psychiatric  History: none Social History:  Social History   Substance and Sexual Activity  Alcohol Use Not Currently     Social History   Substance and Sexual Activity  Drug Use No    Social History   Socioeconomic History  Marital status: Married    Spouse name: Not on file   Number of children: Not on file   Years of education: Not on file   Highest education level: Not on file  Occupational History   Not on file  Tobacco Use   Smoking status: Former    Packs/day: 0.15    Types: Cigarettes    Quit date: 07/2019    Years since quitting: 1.9   Smokeless tobacco: Never  Vaping Use   Vaping Use: Never used  Substance and Sexual Activity   Alcohol use: Not Currently   Drug use: No   Sexual activity: Not on file  Other Topics Concern   Not on file  Social History Narrative   Not on file   Social Determinants of Health   Financial Resource Strain: Not on file  Food Insecurity: Not on file  Transportation Needs: Not on file   Physical Activity: Not on file  Stress: Not on file  Social Connections: Not on file   Additional Social History:    Allergies:   Allergies  Allergen Reactions   Codeine Other (See Comments)    Pt reports allergy but does not remember what it was    Labs:  Results for orders placed or performed during the hospital encounter of 06/14/21 (from the past 48 hour(s))  Protime-INR     Status: None   Collection Time: 06/14/21  4:19 PM  Result Value Ref Range   Prothrombin Time 13.3 11.4 - 15.2 seconds   INR 1.0 0.8 - 1.2    Comment: (NOTE) INR goal varies based on device and disease states. Performed at [facility], [address] [place], [place] [address]   APTT     Status: None   Collection Time: 06/14/21  4:19 PM  Result Value Ref Range   aPTT 29 24 - 36 seconds    Comment: Performed at [facility], [address] [place], [place] [address]  CBC     Status: None   Collection Time: 06/14/21  4:19 PM  Result Value Ref Range   WBC 7.7 4.0 - 10.5 K/uL   RBC 4.33 3.87 - 5.11 MIL/uL   Hemoglobin 13.3 12.0 - 15.0 g/dL   HCT 09.8 11.9 - 14.7 %   MCV 92.4 80.0 - 100.0 fL   MCH 30.7 26.0 - 34.0 pg   MCHC 33.3 30.0 - 36.0 g/dL   RDW 82.9 56.2 - 13.0 %   Platelets 296 150 - 400 K/uL   nRBC 0.0 0.0 - 0.2 %    Comment: Performed at [facility], [address] [place], [place] [address]  Differential     Status: None   Collection Time: 06/14/21  4:19 PM  Result Value Ref Range   Neutrophils Relative % 64 %   Neutro Abs 4.9 1.7 - 7.7 K/uL   Lymphocytes Relative 23 %   Lymphs Abs 1.8 0.7 - 4.0 K/uL   Monocytes Relative 10 %   Monocytes Absolute 0.7 0.1 - 1.0 K/uL   Eosinophils Relative 2 %   Eosinophils Absolute 0.2 0.0 - 0.5 K/uL   Basophils Relative 1 %   Basophils Absolute 0.1 0.0 - 0.1 K/uL   Immature Granulocytes 0 %   Abs Immature Granulocytes 0.03 0.00 - 0.07 K/uL    Comment: Performed at [facility], [address], [place], [place] [address]  Comprehensive metabolic panel     Status: Abnormal   Collection Time: 06/14/21  4:19 PM  Result Value Ref Range   Sodium 136 135 - 145 mmol/L   Potassium 3.9 3.5 - 5.1 mmol/L   Chloride 103 98 - 111 mmol/L   CO2 26 22 - 32 mmol/L   Glucose, Bld 97 70 - 99 mg/dL    Comment: Glucose reference range applies only to samples taken after fasting for at least 8 hours.   BUN 13 8 - 23 mg/dL   Creatinine, Ser 5.00 (H) 0.44 - 1.00 mg/dL   Calcium 9.3 8.9 - 37.0 mg/dL   Total Protein 6.5 6.5 - 8.1 g/dL   Albumin 3.7 3.5 - 5.0 g/dL   AST 30 15 - 41 U/L   ALT 20 0 - 44 U/L   Alkaline Phosphatase 87 38 - 126 U/L   Total Bilirubin 0.9 0.3 - 1.2 mg/dL   GFR, Estimated 47 (L) >60 mL/min    Comment: (NOTE) Calculated using the CKD-EPI Creatinine Equation (2021)    Anion gap 7 5 - 15    Comment: Performed at [facility], [address], [place], [place] [address]  Troponin I (High Sensitivity)     Status: None   Collection Time: 06/14/21  4:19 PM  Result Value Ref Range   Troponin I (High Sensitivity) 8 <18 ng/L    Comment: (NOTE) Elevated high sensitivity troponin I (hsTnI) values and significant  changes across serial measurements may suggest ACS but many other  chronic and acute conditions are known to elevate hsTnI results.  Refer to the "Links" section for chest pain algorithms and additional  guidance. Performed at [facility], [address] [place], [place] [address]   Resp Panel by RT-PCR (Flu A&B, Covid) Nasopharyngeal Swab     Status: None   Collection Time: 06/14/21  5:51 PM   Specimen: Nasopharyngeal Swab; Nasopharyngeal(NP) swabs in vial transport medium  Result Value Ref Range   SARS Coronavirus 2 by RT PCR NEGATIVE NEGATIVE    Comment: (NOTE) SARS-CoV-2 target nucleic acids are NOT DETECTED.  The SARS-CoV-2 RNA is generally detectable in upper respiratory specimens during the acute phase of infection. The  lowest concentration of SARS-CoV-2 viral copies this assay can detect is 138 copies/mL. A negative result does not preclude SARS-Cov-2 infection and should not be used as the sole basis for treatment or other patient management decisions. A negative result may occur with  improper specimen collection/handling, submission of specimen other than nasopharyngeal swab, presence of viral mutation(s) within the areas targeted by this assay, and inadequate number of viral copies(<138 copies/mL). A negative result must be combined with clinical observations, patient history, and epidemiological information. The expected result is Negative.  Fact Sheet for Patients:  BloggerCourse.com  Fact Sheet for Healthcare Providers:  SeriousBroker.it  This test is no t yet approved or cleared by the [place] FDA and  has been authorized for detection and/or diagnosis of SARS-CoV-2 by FDA under an Emergency Use Authorization (EUA). This EUA will remain  in effect (meaning this test can be used) for the duration of the COVID-19 declaration under Section 564(b)(1) of the Act, 21 U.S.C.section 360bbb-3(b)(1), unless the authorization is terminated  or revoked sooner.       Influenza A by PCR NEGATIVE NEGATIVE   Influenza B by PCR NEGATIVE NEGATIVE    Comment: (NOTE) The Xpert Xpress SARS-CoV-2/FLU/RSV plus assay is intended as an aid in the diagnosis of influenza from Nasopharyngeal swab specimens and should not be used as a sole basis for treatment.  Nasal washings and aspirates are unacceptable for Xpert Xpress SARS-CoV-2/FLU/RSV testing.  Fact Sheet for Patients: BloggerCourse.com  Fact Sheet for Healthcare Providers: SeriousBroker.it  This test is not yet approved or cleared by the [place] FDA and has been authorized for detection and/or diagnosis of SARS-CoV-2 by FDA under an Emergency  Use Authorization (EUA). This EUA will remain in effect (meaning this test can be used) for the duration of the COVID-19 declaration under Section 564(b)(1) of the Act, 21 U.S.C. section 360bbb-3(b)(1), unless the authorization is terminated or revoked.  Performed at [facility], [address] [place], [place] [address]   Urine Drug Screen, Qualitative     Status: None   Collection Time: 06/14/21  5:51 PM  Result Value Ref Range   Tricyclic, Ur Screen NONE DETECTED NONE DETECTED   Amphetamines, Ur Screen NONE DETECTED NONE DETECTED   MDMA (Ecstasy)Ur Screen NONE DETECTED NONE DETECTED   Cocaine Metabolite,Ur [place] NONE DETECTED NONE DETECTED   Opiate, Ur Screen NONE DETECTED NONE DETECTED   Phencyclidine (PCP) Ur S NONE DETECTED NONE DETECTED   Cannabinoid 50 Ng, Ur [place] NONE DETECTED NONE DETECTED   Barbiturates, Ur Screen NONE DETECTED NONE DETECTED   Benzodiazepine, Ur Scrn NONE DETECTED NONE DETECTED   Methadone Scn, Ur NONE DETECTED NONE DETECTED    Comment: (NOTE) Tricyclics + metabolites, urine    Cutoff 1000 ng/mL Amphetamines + metabolites, urine  Cutoff 1000 ng/mL MDMA (Ecstasy), urine              Cutoff 500 ng/mL Cocaine Metabolite, urine          Cutoff 300 ng/mL Opiate + metabolites, urine        Cutoff 300 ng/mL Phencyclidine (PCP), urine         Cutoff 25 ng/mL Cannabinoid, urine                 Cutoff 50 ng/mL Barbiturates + metabolites, urine  Cutoff 200 ng/mL Benzodiazepine, urine              Cutoff 200 ng/mL Methadone, urine                   Cutoff 300 ng/mL  The urine drug screen provides only a preliminary, unconfirmed analytical test result and should not be used for non-medical purposes. Clinical consideration and professional judgment should be applied to any positive drug screen result due to possible interfering substances. A more specific alternate chemical method must be used in order to obtain a confirmed analytical result. Gas  chromatography / mass spectrometry (GC/MS) is the preferred confirm atory method. Performed at [facility], [address] [place], [place] [address]   Urinalysis, Routine w reflex microscopic     Status: Abnormal   Collection Time: 06/14/21  5:51 PM  Result Value Ref Range   Color, Urine STRAW (A) YELLOW   APPearance CLEAR (A) CLEAR   Specific Gravity, Urine 1.005 1.005 - 1.030   pH 8.0 5.0 - 8.0   Glucose, UA NEGATIVE NEGATIVE mg/dL   Hgb urine dipstick NEGATIVE NEGATIVE   Bilirubin Urine NEGATIVE NEGATIVE   Ketones, ur NEGATIVE NEGATIVE mg/dL   Protein, ur NEGATIVE NEGATIVE mg/dL   Nitrite NEGATIVE NEGATIVE   Leukocytes,Ua NEGATIVE NEGATIVE    Comment: Performed at [facility], [address] [place], [place] [address]  Ethanol     Status: None   Collection Time: 06/14/21  5:51 PM  Result Value Ref Range   Alcohol,  Ethyl (B) <10 <10 mg/dL    Comment: (NOTE) Lowest detectable limit for serum alcohol is 10 mg/dL.  For medical purposes only. Performed at [facility], [address] [place], [place] [address]   Hemoglobin A1c     Status: None   Collection Time: 06/15/21  6:00 AM  Result Value Ref Range   Hgb A1c MFr Bld 5.2 4.8 - 5.6 %    Comment: (NOTE) Pre diabetes:          5.7%-6.4%  Diabetes:              >6.4%  Glycemic control for   <7.0% adults with diabetes    Mean Plasma Glucose 102.54 mg/dL    Comment: Performed at [facility], [address] [place], [place] [address]  Lipid panel     Status: Abnormal   Collection Time: 06/15/21  6:00 AM  Result Value Ref Range   Cholesterol 323 (H) 0 - 200 mg/dL   Triglycerides 425 (H) <150 mg/dL   HDL 59 >95 mg/dL   Total CHOL/HDL Ratio 5.5 RATIO   VLDL 48 (H) 0 - 40 mg/dL   LDL Cholesterol 638 (H) 0 - 99 mg/dL    Comment:        Total Cholesterol/HDL:CHD Risk Coronary Heart Disease Risk Table                     Men   Women  1/2 Average Risk   3.4   3.3  Average Risk        5.0   4.4  2 X Average Risk   9.6   7.1  3 X Average Risk  23.4   11.0        Use the calculated Patient Ratio above and the CHD Risk Table to determine the patient's CHD Risk.        ATP III CLASSIFICATION (LDL):  <100     mg/dL   Optimal  756-433  mg/dL   Near or Above                    Optimal  130-159  mg/dL   Borderline  295-188  mg/dL   High  >416     mg/dL   Very High Performed at [facility], [address] [place], [place] [address]     Current Facility-Administered Medications  Medication Dose Route Frequency Provider Last Rate Last Admin    stroke: mapping our early stages of recovery book   Does not apply Once Gertha Calkin, MD       acetaminophen (TYLENOL) tablet 650 mg  650 mg Oral Q6H PRN Madelyn Flavors A, MD   650 mg at 06/15/21 1254   amLODipine (NORVASC) tablet 10 mg  10 mg Oral Daily [place], Eric J, DO   10 mg at 06/16/21 1102   aspirin EC tablet 81 mg  81 mg Oral Daily Willy Eddy, MD   81 mg at 06/16/21 1103   atorvastatin (LIPITOR) tablet 80 mg  80 mg Oral Daily [place], Eric J, DO   80 mg at 06/16/21 1103   enoxaparin (LOVENOX) injection 40 mg  40 mg Subcutaneous Daily Sharen Hones, RPH       hydrALAZINE (APRESOLINE) tablet 25 mg  25 mg Oral Q6H PRN [place], Eric J, DO   25 mg at 06/15/21 1725   levothyroxine (SYNTHROID) tablet 25 mcg  25 mcg Oral Q0600 [place], Eric J,  DO       OLANZapine (ZYPREXA) tablet 2.5 mg  2.5 mg Oral BID PRN Charm Rings, NP       Or   OLANZapine [facility]) injection 2.5 mg  2.5 mg Intramuscular BID PRN Charm Rings, NP       sertraline (ZOLOFT) tablet 100 mg  100 mg Oral Daily [place], Alvira Philips, DO   100 mg at 06/16/21 1122   traZODone (DESYREL) tablet 200 mg  200 mg Oral QHS PRN Gertha Calkin, MD   200 mg at 06/15/21 2032   Current Outpatient Medications  Medication Sig Dispense Refill   acetaminophen (TYLENOL) 325 MG tablet Take 650 mg by mouth every 6 (six) hours as needed for moderate pain.      atorvastatin (LIPITOR) 20 MG tablet Take 1 tablet (20 mg total) by mouth daily.     Cholecalciferol (VITAMIN D3 PO) Take 1 tablet by mouth daily.     levothyroxine (SYNTHROID) 25 MCG tablet Take 25 mcg by mouth every morning.     lisinopril (ZESTRIL) 40 MG tablet Take 40 mg by mouth daily.     Multiple Vitamin (MULTIVITAMIN WITH MINERALS) TABS tablet Take 1 tablet by mouth daily.     propranolol ER (INDERAL LA) 80 MG 24 hr capsule Hold this medication because your heart rate is already low in 50's without this.     sertraline (ZOLOFT) 100 MG tablet Take 100 mg by mouth daily.     traMADol (ULTRAM) 50 MG tablet Take 50 mg by mouth 3 (three) times daily as needed for moderate pain.     traZODone (DESYREL) 100 MG tablet Take 200 mg by mouth at bedtime as needed for sleep.     Multiple Vitamins-Minerals (ZINC PO) Take 1 tablet by mouth daily. (Patient not taking: Reported on 06/14/2021)     Spacer/Aero-Holding Chambers (AEROCHAMBER PLUS) inhaler Use as instructed 1 each 2    Musculoskeletal: Strength & Muscle Tone: decreased Gait & Station:  did not witness, per the sitter she needs extensive assistance Patient leans: N/A  Psychiatric Specialty Exam:  Presentation  General Appearance: Appropriate for Environment  Eye Contact:  Fair  Speech:Clear and Coherent  Speech Volume:Normal  Handedness:  Right  Mood and Affect  Mood:Anxious; Dysphoric Irritable at times  Affect:Congruent  Thought Process  Thought Processes:Coherent  Descriptions of Associations:Intact  Orientation:Full (Time, Place and Person)  Thought Content:Logical; Perseveration (really peseverating on her fall and what this means for her life moving forward)  History of Schizophrenia/Schizoaffective disorder:NOne Duration of Psychotic Symptoms:None Hallucinations:None Ideas of Reference:None  Suicidal Thoughts:None Homicidal Thoughts:None  Sensorium  Memory:Immediate Good; Recent Fair; Remote  Fair  Judgment:  Poor Insight:  Lacking Executive Functions  Concentration: Fair Attention Span:  Fair Recall: Fund of Knowledge: Language:Good  psychomotor Activity  Psychomotor Activity:  decreased  Assets  Assets:Communication Skills; Desire for Improvement; Resilience; Social Support  Sleep  Sleep:  Fair  Physical Exam: Physical Exam Vitals and nursing note reviewed.  Constitutional:      Appearance: Normal appearance.  HENT:     Head: Normocephalic.     Nose: Nose normal.  Pulmonary:     Effort: Pulmonary effort is normal.  Musculoskeletal:     Cervical back: Normal range of motion.  Neurological:     General: No focal deficit present.     Mental Status: She is alert.  Psychiatric:        Attention and Perception: Attention and perception normal.  Mood and Affect: Mood is anxious and depressed.        Speech: Speech normal.        Behavior: Behavior normal. Behavior is cooperative.        Thought Content: Thought content normal.        Cognition and Memory: Cognition is impaired.        Judgment: Judgment is impulsive.   Review of Systems  Psychiatric/Behavioral:  Positive for depression. The patient is nervous/anxious.   All other systems reviewed and are negative. Blood pressure (!) 172/83, pulse 78, temperature 97.7 F (36.5 C), temperature source Oral, resp. rate 20, height 5\' 6"  (1.676 m), weight 65.1 kg, SpO2 98 %. Body mass index is 23.16 kg/m.  Treatment Plan Summary: Major depressive disorder, recurrent, mild to moderate: -Continue Zoloft 100 mg daily   Insomnia: -Continue Trazodone 200 mg daily at bedtime  Agitation: -Started Zyprexa 2.5 mg BID PRN agitation (oral or IM)  Disposition: No evidence of imminent risk to self or others at present.   Patient does not meet criteria for psychiatric inpatient admission. Supportive therapy provided about ongoing stressors.  , NP 06/16/2021 12:12 PM

## 2021-06-16 NOTE — TOC Progression Note (Signed)
Transition of Care [facility] - Progression Note    Patient Details  Name: Camelia Stelzner MRN: [id number] Date of Birth: 1941-04-09  Transition of Care [facility]) CM/SW Contact  Joseph Art, [place] Phone Number: 06/16/2021, 4:28 PM  Clinical Narrative:     CSW spoke with patient's son Glori Bickers [phone], and updated him on the assessment from psych NP.  Mr. Maryagnes Amos states he understood there was a determination his mother currently did not have capacity but he did not want to make decisions on her behalf. Mr. Maryagnes Amos stated he would be comfortable with his brother Elson Clan [phone], who lives in [place].  Mr. Maryagnes Amos stated he did not have any interest in becoming the patient's HCPOA or guardianship for the patient. CSW update Mr. Maryagnes Amos that an APS report has been initiated for the patient.    Expected Discharge Plan: Skilled Nursing Facility Barriers to Discharge: Continued Medical Work up  Expected Discharge Plan and Services Expected Discharge Plan: Skilled Nursing Facility In-house Referral: Clinical Social Work   Post Acute Care Choice: Skilled Nursing Facility Living arrangements for the past 2 months: Single Family Home                                       Social Determinants of Health (SDOH) Interventions    Readmission Risk Interventions No flowsheet data found.

## 2021-06-16 NOTE — Plan of Care (Signed)
Patient admitted this evening from the ED.  A&Ox4. Calm and cooperative.  Slightly anxious.  NIH 0.  Sitter at the bedside.

## 2021-06-16 NOTE — Progress Notes (Signed)
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 carotid arteries are patent with antegrade flow. Petrous segments patent bilaterally. Probable mild atheromatous change within the carotid siphons without hemodynamically significant stenosis. 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 Small-vessel again seen coursing from the tip, consistent with a small vascular infundibulum (series 1, image 75). This is stable from prior. A1 segments widely patent. Normal anterior communicating artery complex. Anterior cerebral arteries patent without high-grade stenosis. No M1 stenosis or occlusion. Normal MCA bifurcations. Distal MCA branches perfused and symmetric. POSTERIOR CIRCULATION: Visualized vertebral arteries patent without stenosis. Left vertebral artery dominant. Both PICA origins are patent and normal. Basilar patent without stenosis. Superior cerebellar arteries patent bilaterally.  Both PCAs primarily supplied via the basilar well perfused to their distal aspects. No other aneurysm or vascular abnormality seen about the intracranial circulation. IMPRESSION: MRI HEAD IMPRESSION: 1. Technically limited exam due to patient's inability to tolerate the full length of the study and motion artifact. 2. 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 No significant surrounding edema. 3. No other new acute intracranial infarct or other abnormality. MRA HEAD IMPRESSION: 1. Motion degraded exam. 2. Stable appearance of the intracranial circulation with no large vessel occlusion or hemodynamically significant stenosis. 3. 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 No aneurysm or other vascular abnormality. Please note that while an MRA of the neck was also ordered as a part of this exam, the patient was unable to tolerate the full length of the study. Consider re-attempt when the patient is able to tolerate. Alternatively, carotid Doppler ultrasound of the neck could be performed as well. Electronically Signed   By: Rise Mu M.D.   On: 06/14/2021 20:42   US Carotid Bilateral  Result Date: 06/15/2021 CLINICAL DATA:  Transient ischemic attack EXAM: BILATERAL CAROTID DUPLEX ULTRASOUND TECHNIQUE: Wallace Cullens scale imaging, color Doppler and duplex ultrasound were performed of bilateral carotid and vertebral arteries in the neck. COMPARISON:  None. FINDINGS: Criteria: Quantification of carotid stenosis is based on velocity parameters that correlate the residual internal carotid diameter with NASCET-based stenosis levels, using the diameter of the distal internal carotid lumen as the denominator for stenosis measurement. The following velocity measurements were obtained: RIGHT ICA: 69/11 cm/sec CCA: 49/12 cm/sec SYSTOLIC ICA/CCA RATIO:  1.4 ECA:  83 cm/sec LEFT ICA:  72/21 cm/sec CCA: 51/13 cm/sec SYSTOLIC ICA/CCA RATIO:  1.4 ECA:  98 cm/sec RIGHT CAROTID ARTERY: Mild smooth heterogeneous atherosclerotic plaque in the proximal internal carotid artery. By peak systolic velocity criteria, the estimated stenosis is less than 50%. RIGHT VERTEBRAL ARTERY:  Patent with normal antegrade flow. LEFT CAROTID ARTERY: Mild smooth heterogeneous atherosclerotic plaque in the proximal internal carotid artery. Arteries are highly tortuous. By peak systolic velocity criteria, the estimated stenosis is less than 50%. LEFT VERTEBRAL ARTERY:  Patent with normal antegrade flow. IMPRESSION: 1. 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 2. Mild (1-49%) stenosis proximal left internal carotid artery secondary to smooth heterogeneous atherosclerotic plaque. 3. Vertebral arteries are patent with normal antegrade flow. Signed, Sterling Big, MD, RPVI [facility]  Electronically Signed   By: Malachy Moan M.D.   On: 06/15/2021 16:54        Scheduled Meds:   stroke: mapping our early stages of recovery book   Does not apply Once   amLODipine  10 mg Oral Daily   aspirin EC  81 mg Oral Daily   atorvastatin  80 mg Oral Daily   enoxaparin (LOVENOX) injection  40 mg Subcutaneous Daily   levothyroxine  25 mcg Oral Q0600   sertraline  100 mg Oral Daily   Continuous Infusions:   LOS: 1 day    Time spent: 38 minutes spent on chart review, discussion with nursing staff, consultants, updating family and interview/physical exam; more than 50% of that time was spent in counseling and/or coordination of care.    Alvira Philips [place], DO [facility] Available via Epic secure chat 7am-7pm After these hours, please refer to coverage provider listed on [email or web address] 06/16/2021, 4:19 PM

## 2021-06-16 NOTE — ED Notes (Signed)
Rn aware bed assigned 

## 2021-06-16 NOTE — Progress Notes (Signed)
*  PRELIMINARY RESULTS* Echocardiogram 2D Echocardiogram has been performed.  Cristela Blue 06/16/2021, 12:12 PM

## 2021-06-17 MED ORDER — LOSARTAN POTASSIUM 50 MG PO TABS
50.0000 mg | ORAL_TABLET | Freq: Every day | ORAL | Status: DC
  Administered 2021-06-17: 50 mg via ORAL
  Filled 2021-06-17: qty 1
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
## 2021-06-18 LAB — GLUCOSE, CAPILLARY: Glucose-Capillary: 100 mg/dL — ABNORMAL HIGH (ref 70–99)

## 2021-06-18 MED ORDER — LOSARTAN POTASSIUM 50 MG PO TABS
100.0000 mg | ORAL_TABLET | Freq: Every day | ORAL | Status: DC
  Administered 2021-06-18 – 2021-06-22 (×5): 100 mg via ORAL
  Filled 2021-06-18 (×5): qty 2

## 2021-06-18 MED ORDER — CLONIDINE HCL 0.1 MG PO TABS: 0.1000 mg | ORAL_TABLET | Freq: Two times a day (BID) | ORAL | Status: DC
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
## 2021-06-19 LAB — BASIC METABOLIC PANEL
Anion gap: 7 (ref 5–15)
BUN: 22 mg/dL (ref 8–23)
CO2: 26 mmol/L (ref 22–32)
Calcium: 9.6 mg/dL (ref 8.9–10.3)
Chloride: 105 mmol/L (ref 98–111)
Creatinine, Ser: 1.17 mg/dL — ABNORMAL HIGH (ref 0.44–1.00)
GFR, Estimated: 47 mL/min — ABNORMAL LOW (ref >60)
Glucose, Bld: 109 mg/dL — ABNORMAL HIGH (ref 70–99)
Potassium: 4.1 mmol/L (ref 3.5–5.1)
Sodium: 138 mmol/L (ref 135–145)

## 2021-06-19 LAB — MAGNESIUM: Magnesium: 1.9 mg/dL (ref 1.7–2.4)

## 2021-06-19 MED ORDER — CARVEDILOL 6.25 MG PO TABS
12.5000 mg | ORAL_TABLET | Freq: Two times a day (BID) | ORAL | Status: DC
  Administered 2021-06-19 – 2021-06-22 (×7): 12.5 mg via ORAL
  Filled 2021-06-19 (×7): qty 2

## 2021-06-19 NOTE — Progress Notes (Signed)
Occupational Therapy Treatment Patient Details Name: Jenna Powers MRN: [id number] DOB: 1941-01-11 Today's Date: 06/19/2021   History of present illness 80 yo female presented to ED with complaints of right hand weakness. Recent admission with 3 ICHs+ midline shift, 7/22. PMHx includes OA, depression, HLD, HTN, neuropathy, thyroid disease, tremor.   OT comments  Pt seen for OT tx. Pt up in recliner, pleasant, and oriented x3. Agreeable to session. Pt required Supv/SBA for ADL transfers w/ RW plus VC for hand placement/safety. Pt stood at the sink with CGA and RW to brush her teeth including cleaning her partial denture and apply lipstick in front of the mirror. Intermittent UE support on the counter, feet slightly staggered likely to improve pt's sense of stability. Decr activity tolerance leading to pt needing to sit EOB. When asked if she would like to "wash up", pt took washcloth and began washing her face, smearing lipstick a bit and  for washing her face afterwards and requiring cue to recall she applied it. Pt declined further wash up. Pt returned to recliner with Supv/SBA + RW and demonstrated decreased safety with return to recliner, as she attempted to sit prematurely, requiring VC and additional instruction once seated with visual demonstration to improve safety. Pt continues to benefit from skilled OT services. Continue to recommend higher level of care with 24/7 sup/assist 2/2 decreased safety and other functional impairments.    Recommendations for follow up therapy are one component of a multi-disciplinary discharge planning process, led by the attending physician.  Recommendations may be updated based on patient status, additional functional criteria and insurance authorization.    Follow Up Recommendations  Skilled nursing-short term rehab (<3 hours/day)    Assistance Recommended at Discharge Frequent or constant Supervision/Assistance  Equipment Recommendations  [facility]     Recommendations for Other Services      Precautions / Restrictions Precautions Precautions: Fall Restrictions Weight Bearing Restrictions: No       Mobility Bed Mobility               General bed mobility comments: NT, up in recliner at start and end of session    Transfers Overall transfer level: Needs assistance Equipment used: Rolling walker (2 wheels) Transfers: Sit to/from Stand Sit to Stand: Min guard           General transfer comment: VC for RW mgt, hand placement     Balance Overall balance assessment: Needs assistance;History of Falls Sitting-balance support: No upper extremity supported Sitting balance-Leahy Scale: Good Sitting balance - Comments: able to raise arms and reach across chest without LOB   Standing balance support: Single extremity supported;During functional activity Standing balance-Leahy Scale: Fair Standing balance comment: Pt is able to maintain balance short walking distances without lOB but requires BUE support for safety and poor awareness                           ADL either performed or assessed with clinical judgement   ADL Overall ADL's : Needs assistance/impaired     Grooming: Standing;Set up;Supervision/safety;Sitting;Min guard Grooming Details (indicate cue type and reason): Pt stood at the sink with CGA and RW to brush her teeth including cleaning her partial denture and apply lipstick in front of the mirror. Intermittent UE support on the counter, feet slightly staggered likely to improve pt's sense of stability. Decr activity tolerance leading to pt needing to sit EOB. When asked if she would like to "wash up",  pt took washcloth and began washing her face, smearing lipstick a bit and  for washing her face afterwards and requiring cue to recall she applied it. Pt declined further wash up                                     Vision       Perception     Praxis      Cognition  Arousal/Alertness: Awake/alert Behavior During Therapy: [facility] for tasks assessed/performed Overall Cognitive Status: No family/caregiver present to determine baseline cognitive functioning                                 General Comments: alert and oriented to person, place, situation, and reported it was November but easily reoriented to October. PRN VC for safety. Decr awareness of safety, deficits.          Exercises Other Exercises Other Exercises: Toilet to Bed w/ RW, pt performed pericare independently but does not keep RW within BOS despite cues Other Exercises: Pt instructed in ADL transfers to improve technique and safety, as she attempts to sit prematurely and not directly in front of sitting surface putting her at high risk of falls.   Shoulder Instructions       General Comments      Pertinent Vitals/ Pain       Pain Assessment: No/denies pain  Home Living                                          Prior Functioning/Environment              Frequency  Min 1X/week        Progress Toward Goals  OT Goals(current goals can now be found in the care plan section)  Progress towards OT goals: Progressing toward goals  Acute Rehab OT Goals Patient Stated Goal: get stronger and be independent OT Goal Formulation: With patient Time For Goal Achievement: 06/29/21 Potential to Achieve Goals: Good  Plan Discharge plan remains appropriate;Frequency remains appropriate    Co-evaluation                 AM-PAC OT "6 Clicks" Daily Activity     Outcome Measure   Help from another person eating meals?: A Little Help from another person taking care of personal grooming?: A Little Help from another person toileting, which includes using toliet, bedpan, or urinal?: A Little Help from another person bathing (including washing, rinsing, drying)?: A Little Help from another person to put on and taking off regular upper body clothing?: A  Little Help from another person to put on and taking off regular lower body clothing?: A Little 6 Click Score: 18    End of Session Equipment Utilized During Treatment: Rolling walker (2 wheels)  OT Visit Diagnosis: Other abnormalities of gait and mobility (R26.89);Repeated falls (R29.6);Muscle weakness (generalized) (M62.81);Other symptoms and signs involving cognitive function   Activity Tolerance Patient tolerated treatment well   Patient Left in chair;with call bell/phone within reach;with chair alarm set   Nurse Communication          Time: [id number] OT Time Calculation (min): 13 min  Charges: OT General Charges $OT Visit: 1 Visit OT Treatments $Self Care/Home Management :  8-22 mins  Arman Filter., MPH, MS, OTR/L ascom [phone] 06/19/21, 1:57 PM
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
## 2021-06-20 NOTE — Progress Notes (Signed)
Physical Therapy Treatment Patient Details Name: Jenna Powers MRN: [id number] DOB: 1941-01-01 Today's Date: 06/20/2021   History of Present Illness 80 yo female presented to ED with complaints of right hand weakness. Recent admission with 3 ICHs+ midline shift, 7/22. PMHx includes OA, depression, HLD, HTN, neuropathy, thyroid disease, tremor.    PT Comments    Pt received ambulating from bathroom to sitting EoB upon entry. Pt agreeable to PT services.  Compared to EMR and previous PT visits, HR (max HR post amb 97 Bpm with decrease to 81 after seated rest) and BP WNL's and is not elevated today with HR responding appropriately with increased level of activity and with periods of rest during session. Although pt able to progress to amb 160' today with RW, pt requires very close CGA and mod, multimodal cuing for safety with transfers and ambulation. Pt continues to have poor safety awareness with transfers and poor hand placement even with PT demo and education on proper technique to reduce risk of falls. With ambulation pt has significant L drift requiring max VC's and PT assist on RW to prevent pt bumping into walls/other obstacles in hallway and to keep RW clsoer to BOS. Attempts made to utilize line on floor from different color tiles as visual cue to orient pt to amb in straight line but very poor carryover after cuing. Frequent standing rest breaks continue to be required due to reports of LE fatigue throughout. Although pt progressing in toleration to further distances, pt remains at high risk for falls for significant safety deficits thus STR remains appropriate.   BP pre: 116/62 mm Hg BP post: 119/60 mm Hg   Recommendations for follow up therapy are one component of a multi-disciplinary discharge planning process, led by the attending physician.  Recommendations may be updated based on patient status, additional functional criteria and insurance authorization.  Follow Up  Recommendations  Skilled nursing-short term rehab (<3 hours/day)     Assistance Recommended at Discharge Intermittent Supervision/Assistance  Equipment Recommendations  Rolling walker (2 wheels)    Recommendations for Other Services       Precautions / Restrictions Precautions Precautions: Fall Restrictions Weight Bearing Restrictions: No     Mobility  Bed Mobility               General bed mobility comments: Sitting EOB start of session Patient Response: Cooperative  Transfers Overall transfer level: Needs assistance Equipment used: Rolling walker (2 wheels) Transfers: Sit to/from Stand Sit to Stand: Min guard           General transfer comment: VC for RW mgt, hand placement    Ambulation/Gait Ambulation/Gait assistance: Min guard Gait Distance (Feet): 160 Feet Assistive device: Rolling walker (2 wheels) Gait Pattern/deviations: Step-to pattern;Narrow base of support     General Gait Details: Remains with significant L drift with RW in hallway. Use of visual cues of line on floor to orient ambulating in straight line with poor carryover. RW far outside BOS and difficulty navigating obstacles requiring PT assist on RW to prevent running into walls/obstacles on L side.   Stairs             Wheelchair Mobility    Modified Rankin (Stroke Patients Only)       Balance Overall balance assessment: Needs assistance;History of Falls Sitting-balance support: No upper extremity supported Sitting balance-Leahy Scale: Good     Standing balance support: Bilateral upper extremity supported;During functional activity Standing balance-Leahy Scale: Fair Standing balance comment: Requires BUE support  on RW to maintain stability                            Cognition Arousal/Alertness: Awake/alert Behavior During Therapy: WFL for tasks assessed/performed Overall Cognitive Status: No family/caregiver present to determine baseline cognitive  functioning                                 General Comments: Remains with decreased safety awareness and current deficits despite cuing and education provided.        Exercises Other Exercises Other Exercises: use of visual cues on floor to reduce L drift, safe use of RW with amb and transfers, scanning environment for obstacle navigation    General Comments General comments (skin integrity, edema, etc.): Max HR elevated to 97 BPM wpost amb. BP WNL today prior and after mobility (119/60 mm Hg post amb)      Pertinent Vitals/Pain Pain Assessment: Faces Faces Pain Scale: Hurts a little bit Pain Location: Low back Pain Intervention(s): Monitored during session;Repositioned    Home Living                          Prior Function            PT Goals (current goals can now be found in the care plan section) Acute Rehab PT Goals PT Goal Formulation: With patient Time For Goal Achievement: 06/29/21 Potential to Achieve Goals: Fair Progress towards PT goals: Progressing toward goals    Frequency    Min 2X/week      PT Plan Current plan remains appropriate    Co-evaluation              AM-PAC PT "6 Clicks" Mobility   Outcome Measure  Help needed turning from your back to your side while in a flat bed without using bedrails?: A Little Help needed moving from lying on your back to sitting on the side of a flat bed without using bedrails?: A Little Help needed moving to and from a bed to a chair (including a wheelchair)?: A Little Help needed standing up from a chair using your arms (e.g., wheelchair or bedside chair)?: A Little Help needed to walk in hospital room?: A Little Help needed climbing 3-5 steps with a railing? : A Little 6 Click Score: 18    End of Session Equipment Utilized During Treatment: Gait belt Activity Tolerance: Patient tolerated treatment well Patient left: in chair;with call bell/phone within reach;with chair alarm  set Nurse Communication: Mobility status PT Visit Diagnosis: Muscle weakness (generalized) (M62.81);Difficulty in walking, not elsewhere classified (R26.2);Unsteadiness on feet (R26.81);History of falling (Z91.81)     Time: 1601-0932 PT Time Calculation (min) (ACUTE ONLY): 31 min  Charges:  $Gait Training: 23-37 mins                    Delphia Grates. Fairly IV, PT, DPT Physical Therapist- [place]  [facility]  06/20/2021, 10:34 AM

## 2021-06-21 DIAGNOSIS — R410 Disorientation, unspecified: Secondary | ICD-10-CM

## 2021-06-21 LAB — BASIC METABOLIC PANEL
Anion gap: 7 (ref 5–15)
BUN: 23 mg/dL (ref 8–23)
CO2: 25 mmol/L (ref 22–32)
Calcium: 9.1 mg/dL (ref 8.9–10.3)
Chloride: 106 mmol/L (ref 98–111)
Creatinine, Ser: 1.06 mg/dL — ABNORMAL HIGH (ref 0.44–1.00)
GFR, Estimated: 52 mL/min — ABNORMAL LOW (ref >60)
Glucose, Bld: 111 mg/dL — ABNORMAL HIGH (ref 70–99)
Potassium: 4.4 mmol/L (ref 3.5–5.1)
Sodium: 138 mmol/L (ref 135–145)

## 2021-06-21 NOTE — Progress Notes (Signed)
PT Cancellation Note  Patient Details Name: Jenna Powers MRN: [id number] DOB: 1940/09/24   Cancelled Treatment:    Reason Eval/Treat Not Completed: Other (comment). Pt with chaplain at bedside, denied PT returning later stating "I want to go home", visibly upset. PT to re-attempt as able.   Olga Coaster PT, DPT 3:28 PM,06/21/21
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
## 2021-06-21 NOTE — TOC Progression Note (Signed)
Transition of Care [facility]) - Progression Note    Patient Details  Name: Jenna Powers MRN: [id number] Date of Birth: Jun 15, 1941  Transition of [facility] CM/SW Contact  Allayne Butcher, RN Phone Number: 06/21/2021, 3:11 PM  Clinical Narrative:    Patient's mental status seems much improved from when she first arrived to the hospital.  Patient is alert and oriented.  She tells this RNCM that she lives alone but has 2 sons and they help her with whatever she needs.  [name] takes out her trash and sometimes provides transportation, Glenn Dale lives in [place] but drives up when he needs to and orders her groceries for her online and has them delivered to her home.  Patient was driving but does not feel very comfortable doing it now.  She wants to change her pharmacy to [facility] because they deliver.  Patient will need a ride home at discharge.  She also needs a RW and this will be ordered from [employer] and delivered to her room before discharge.  Psychiatry was consulted to re evaluate patient.  If psych signs off patient can discharge home- she agrees to [facility] services, no agency preference.  Referral given to Sarah with [facility]) for RN and PT.       Expected Discharge Plan: Home w Home Health Services Barriers to Discharge: Continued Medical Work up  Expected Discharge Plan and Services Expected Discharge Plan: Home w Home Health Services In-house Referral: Clinical Social Work Discharge Planning Services: CM Consult Post Acute Care Choice: Home Health Living arrangements for the past 2 months: Single Family Home                 DME Arranged: Walker rolling         HH Arranged: RN, PT HH Agency: [facility] Date HH Agency Contacted: 06/21/21 Time HH Agency Contacted: 1508 Representative spoke with at [facility] Agency: Maralyn Sago   Social Determinants of Health (SDOH) Interventions    Readmission Risk Interventions No flowsheet data found.

## 2021-06-21 NOTE — Progress Notes (Signed)
Occupational Therapy Treatment Patient Details Name: Ayushi Pla MRN: [id number] DOB: [birth year] Today's Date: 06/21/2021   History of present illness 80 yo female presented to ED with complaints of right hand weakness. Recent admission with 3 ICHs+ midline shift, 7/22. PMHx includes OA, depression, HLD, HTN, neuropathy, thyroid disease, tremor.   OT comments  Upon entering the room, pt supine in bed and agreeable to OT intervention. Pt performs all bed mobility without assistance. Pt given SLUMs this session and scores 12/30 with any score falling within 1-20 (with HS education) falls into the dementia category. Pt able to answer orientation questions without issue but when asked anything related to problem solving, working memory, short term memory, and when asked to label clock face she was unable. Pt also reporting that she could and has been ambulating without use of RW in the room. Pt standing and attempting to pull herself up with use of rolling table. Pt needing mod verbal cuing for safety awareness secondary to pt being impulsive and lacking insight to deficits. Pt "lurching" in room and reaching out in an attempt to hold onto furniture vs using RW. LOB requiring assistance to correct for safety. Based on session, OT discussed that if pt went home she would need 24/7 supervision/assist for safety. Pt returning to bed at end of session.    Recommendations for follow up therapy are one component of a multi-disciplinary discharge planning process, led by the attending physician.  Recommendations may be updated based on patient status, additional functional criteria and insurance authorization.    Follow Up Recommendations  Home health OT    Assistance Recommended at Discharge Frequent or constant Supervision/Assistance  Equipment Recommendations  [facility]       Precautions / Restrictions Precautions Precautions: Fall       Mobility Bed Mobility Overal bed mobility: Needs  Assistance Bed Mobility: Supine to Sit;Sit to Supine     Supine to sit: Supervision Sit to supine: Supervision        Transfers Overall transfer level: Needs assistance Equipment used: Rolling walker (2 wheels) Transfers: Sit to/from Stand Sit to Stand: Min assist                 Balance Overall balance assessment: Needs assistance Sitting-balance support: No upper extremity supported;Feet supported Sitting balance-Leahy Scale: Good     Standing balance support: During functional activity Standing balance-Leahy Scale: Poor                             ADL either performed or assessed with clinical judgement   ADL Overall ADL's : Needs assistance/impaired                         Toilet Transfer: Minimal assistance;Moderate assistance;Ambulation Toilet Transfer Details (indicate cue type and reason): without use of AD Toileting- Clothing Manipulation and Hygiene: Minimal assistance;Sit to/from stand       Functional mobility during ADLs: Minimal assistance;Moderate assistance General ADL Comments: Pt with very limited insight to deficits and situation. She reports, " I can walk by myself". Pt unable to navigate room safely without reaching out to rolling table for balance and attempting furniture walking while also having multiple LOB's requiring min - mod A to correct.     Vision Patient Visual Report: No change from baseline            Cognition Arousal/Alertness: Awake/alert Behavior During Therapy: Impulsive Overall Cognitive  Status: No family/caregiver present to determine baseline cognitive functioning                                 General Comments: SLUM given with pt scoring 12/30 placing her in dementia category. Pt very impulsive and showing poor safey awareness with physical assist and verbal cuing needed to keep pt safe with mobility and self care tasks.                     Pertinent Vitals/ Pain        Pain Assessment: No/denies pain         Frequency  Min 1X/week        Progress Toward Goals  OT Goals(current goals can now be found in the care plan section)  Progress towards OT goals: Progressing toward goals  Acute Rehab OT Goals Patient Stated Goal: to go home OT Goal Formulation: With patient Time For Goal Achievement: 06/29/21 Potential to Achieve Goals: Good  Plan Frequency remains appropriate;Discharge plan needs to be updated       AM-PAC OT "6 Clicks" Daily Activity     Outcome Measure   Help from another person eating meals?: None Help from another person taking care of personal grooming?: None Help from another person toileting, which includes using toliet, bedpan, or urinal?: A Little Help from another person bathing (including washing, rinsing, drying)?: A Little Help from another person to put on and taking off regular upper body clothing?: A Little Help from another person to put on and taking off regular lower body clothing?: A Little 6 Click Score: 20    End of Session    OT Visit Diagnosis: Other abnormalities of gait and mobility (R26.89);Repeated falls (R29.6);Muscle weakness (generalized) (M62.81);Other symptoms and signs involving cognitive function   Activity Tolerance Patient tolerated treatment well   Patient Left with call bell/phone within reach;in bed;with bed alarm set   Nurse Communication Mobility status;Other (comment) (SLUMs score)        Time: 1791-5056 OT Time Calculation (min): 25 min  Charges: OT General Charges $OT Visit: 1 Visit OT Treatments $Self Care/Home Management : 8-22 mins $Therapeutic Activity: 8-22 mins  Jackquline Denmark, MS, OTR/L , CBIS ascom [phone]  06/21/21, 4:17 PM

## 2021-06-21 NOTE — Progress Notes (Signed)
   06/21/21 1530  Clinical Encounter Type  Visited With Patient  Visit Type Initial;Spiritual support;Social support  Referral From Nurse  Consult/Referral To Chaplain  Spiritual Encounters  Spiritual Needs Emotional  Chaplain Sophronia Simas responded to a request for support from Pt's nurse. Chaplain Burris provided a non-anxious, compassionate presence. Chaplain Burris validated Pt's emotions, primarily fear associated with sense of not being heard or understood and fear after assessment by psychiatrist and concerns about losing agency. Chaplain Burris used active and reflective listening to help Pt reframe the experience with the psychiatrist assessment and to stay present rather than getting caught by speculation and worry. We also discussed concerns of housing and family dynamics. Pt stated that she felt much better after our visit.

## 2021-06-21 NOTE — Consult Note (Signed)
[facility] Face-to-Face Psychiatry Consult   Reason for Consult: Follow-up consult for 80 year old woman recovering from hypertensive emergency Referring Physician:  Ghimire Patient Identification: Jenna Powers MRN:  [id number] Principal Diagnosis: Acute delirium Diagnosis:  Principal Problem:   Acute delirium Active Problems:   Essential hypertension   Anxiety   History of depression   Other specified hypothyroidism   Right hand weakness   Cerebral infarction (HCC)   Total Time spent with patient: 1 hour  Subjective:   Jenna Powers is a 80 y.o. female patient admitted with "I thought I had another stroke".  HPI: Patient seen chart reviewed.  This is an 80 year old woman with a history of multiple medical problems.  She came into the hospital on the 26th after an episode of weakness.  Patient was afraid she was having a stroke again.  Patient was in the emergency room overnight and during that time became agitated and confused requiring sedation.  Seen by psychiatry consult the next day who felt the patient lacked capacity to make decisions.  On interview today the patient is able to describe correctly the events that led to her coming to the hospital.  She says she had been at assisted living but her son had helped her to move back home.  At home she had an episode that felt like it might be a stroke so she called 911.  Patient has only vague memories of her episode of getting agitated.  She seems to believe she was justified in being angry at some care provider but cannot give me any details.  Currently however she says her mood is feeling fine.  Denies anger or hostility or depression.  No suicidal or homicidal thoughts.  No hallucinations.  Patient is alert and oriented lucid and able to describe an appropriate plan for taking care of herself after she leaves.  She knows her primary care doctor and her medication.  Past Psychiatric History: Past history of some anxiety and depression.   No hospitalization and no suicide attempts.  Risk to Self:   Risk to Others:   Prior Inpatient Therapy:   Prior Outpatient Therapy:    Past Medical History:  Past Medical History:  Diagnosis Date   Anxiety    Chronic kidney disease    Depression    Hyperlipemia    Hypertension    Hypothyroidism    Neuropathy    Stroke (HCC)    Thyroid disease    Tremor     Past Surgical History:  Procedure Laterality Date   ABDOMINAL HYSTERECTOMY     ANKLE SURGERY Right    Family History:  Family History  Problem Relation Age of Onset   Pneumonia Mother    Hypertension Father    Heart disease Father    Stroke Father    Family Psychiatric  History: Unknown Social History:  Social History   Substance and Sexual Activity  Alcohol Use Not Currently     Social History   Substance and Sexual Activity  Drug Use No    Social History   Socioeconomic History   Marital status: Widowed    Spouse name: Not on file   Number of children: Not on file   Years of education: Not on file   Highest education level: Not on file  Occupational History   Not on file  Tobacco Use   Smoking status: Former    Packs/day: 0.15    Types: Cigarettes    Quit date: 07/2019  Years since quitting: 1.9   Smokeless tobacco: Never  Vaping Use   Vaping Use: Never used  Substance and Sexual Activity   Alcohol use: Not Currently   Drug use: No   Sexual activity: Not on file  Other Topics Concern   Not on file  Social History Narrative   Lives alone at home.    Social Determinants of Health   Financial Resource Strain: Not on file  Food Insecurity: Not on file  Transportation Needs: Not on file  Physical Activity: Not on file  Stress: Not on file  Social Connections: Not on file   Additional Social History:    Allergies:   Allergies  Allergen Reactions   Codeine Other (See Comments)    Pt reports allergy but does not remember what it was    Labs:  Results for orders placed or  performed during the hospital encounter of 06/14/21 (from the past 48 hour(s))  Basic metabolic panel     Status: Abnormal   Collection Time: 06/21/21  5:54 AM  Result Value Ref Range   Sodium 138 135 - 145 mmol/L   Potassium 4.4 3.5 - 5.1 mmol/L   Chloride 106 98 - 111 mmol/L   CO2 25 22 - 32 mmol/L   Glucose, Bld 111 (H) 70 - 99 mg/dL    Comment: Glucose reference range applies only to samples taken after fasting for at least 8 hours.   BUN 23 8 - 23 mg/dL   Creatinine, Ser 7.02 (H) 0.44 - 1.00 mg/dL   Calcium 9.1 8.9 - 63.7 mg/dL   GFR, Estimated 52 (L) >60 mL/min    Comment: (NOTE) Calculated using the CKD-EPI Creatinine Equation (2021)    Anion gap 7 5 - 15    Comment: Performed at [facility], [address], [place], [place] [address]    Current Facility-Administered Medications  Medication Dose Route Frequency Provider Last Rate Last Admin   acetaminophen (TYLENOL) tablet 650 mg  650 mg Oral Q6H PRN Madelyn Flavors A, MD   650 mg at 06/20/21 1107   amLODipine (NORVASC) tablet 10 mg  10 mg Oral Daily [place], Eric J, DO   10 mg at 06/21/21 0758   aspirin EC tablet 81 mg  81 mg Oral Daily Willy Eddy, MD   81 mg at 06/21/21 0758   atorvastatin (LIPITOR) tablet 80 mg  80 mg Oral Daily [place], Eric J, DO   80 mg at 06/21/21 0758   carvedilol (COREG) tablet 12.5 mg  12.5 mg Oral BID WC [place], Eric J, DO   12.5 mg at 06/21/21 0758   enoxaparin (LOVENOX) injection 40 mg  40 mg Subcutaneous Daily Sharen Hones, RPH   40 mg at 06/21/21 0759   hydrALAZINE (APRESOLINE) tablet 25 mg  25 mg Oral Q6H PRN [place], Eric J, DO   25 mg at 06/19/21 0277   levothyroxine (SYNTHROID) tablet 25 mcg  25 mcg Oral Q0600 [place], Eric J, DO   25 mcg at 06/21/21 0510   losartan (COZAAR) tablet 100 mg  100 mg Oral Daily [place], Eric J, DO   100 mg at 06/21/21 0758   OLANZapine (ZYPREXA) tablet 2.5 mg  2.5 mg Oral BID PRN Charm Rings, NP   2.5 mg at 06/16/21 2137   Or    OLANZapine (ZYPREXA) injection 2.5 mg  2.5 mg Intramuscular BID PRN Charm Rings, NP       sertraline (ZOLOFT) tablet 100 mg  100 mg  Oral Daily [place], Eric J, DO   100 mg at 06/21/21 0109   traZODone (DESYREL) tablet 200 mg  200 mg Oral QHS PRN Gertha Calkin, MD   200 mg at 06/20/21 2031    Musculoskeletal: Strength & Muscle Tone: within normal limits Gait & Station: unsteady Patient leans: N/A            Psychiatric Specialty Exam:  Presentation  General Appearance: Appropriate for Environment  Eye Contact:Minimal  Speech:Clear and Coherent  Speech Volume:Normal  Handedness:No data recorded  Mood and Affect  Mood:Anxious; Dysphoric  Affect:Congruent   Thought Process  Thought Processes:Coherent  Descriptions of Associations:Intact  Orientation:Full (Time, Place and Person)  Thought Content:Logical; Perseveration (really peseverating on her fall and what this means for her life moving forward)  History of Schizophrenia/Schizoaffective disorder:No data recorded Duration of Psychotic Symptoms:No data recorded Hallucinations:No data recorded Ideas of Reference:None  Suicidal Thoughts:No data recorded Homicidal Thoughts:No data recorded  Sensorium  Memory:Immediate Good; Recent Fair; Remote Fair  Judgment:Intact  Insight:Fair   Executive Functions  Concentration:Fair  Attention Span:Good  Recall:Fair  Fund of Knowledge:Good  Language:Good   Psychomotor Activity  Psychomotor Activity:No data recorded  Assets  Assets:Communication Skills; Desire for Improvement; Resilience; Social Support   Sleep  Sleep:No data recorded  Physical Exam: Physical Exam Vitals and nursing note reviewed.  Constitutional:      Appearance: Normal appearance.  HENT:     Head: Normocephalic and atraumatic.     Mouth/Throat:     Pharynx: Oropharynx is clear.  Eyes:     Pupils: Pupils are equal, round, and reactive to light.  Cardiovascular:      Rate and Rhythm: Normal rate and regular rhythm.  Pulmonary:     Effort: Pulmonary effort is normal.     Breath sounds: Normal breath sounds.  Abdominal:     General: Abdomen is flat.     Palpations: Abdomen is soft.  Musculoskeletal:        General: Normal range of motion.  Skin:    General: Skin is warm and dry.  Neurological:     General: No focal deficit present.     Mental Status: She is alert. Mental status is at baseline.  Psychiatric:        Mood and Affect: Mood normal.        Thought Content: Thought content normal.   Review of Systems  Constitutional: Negative.   HENT: Negative.    Eyes: Negative.   Respiratory: Negative.    Cardiovascular: Negative.   Gastrointestinal: Negative.   Musculoskeletal: Negative.   Skin: Negative.   Neurological: Negative.   Psychiatric/Behavioral: Negative.    Blood pressure (!) 153/98, pulse (!) 59, temperature 98.2 F (36.8 C), resp. rate 16, height 5\' 6"  (1.676 m), weight 65.1 kg, SpO2 98 %. Body mass index is 23.16 kg/m.  Treatment Plan Summary: Plan 80 year old woman who appears to have had an episode of delirium in the emergency room during the acute presentation of her illness.  This is not unusual and multiple factors could have been involved.  On examination today however the delirium is completely resolved.  No sign of delirium.  No sign of depression.  Given her age the patient appears to be in pretty good mental condition.  She is able to accurately and articulately explain her situation and her plans for the future.  Everything are about her at this point would suggest that she possesses the normal capacity to make reasonable decisions for herself.  No need for psychiatric hospitalization or treatment at this time.  Reviewed with treatment team  Disposition: No evidence of imminent risk to self or others at present.   Patient does not meet criteria for psychiatric inpatient admission. Supportive therapy provided about ongoing  stressors. Discussed crisis plan, support from social network, calling 911, coming to the Emergency Department, and calling Suicide Hotline.  Mordecai Rasmussen, MD 06/21/2021 4:02 PM

## 2021-06-22 MED ORDER — LOSARTAN POTASSIUM 100 MG PO TABS: 100.0000 mg | ORAL_TABLET | Freq: Every day | ORAL | 1 refills | Status: AC

## 2021-06-22 MED ORDER — ASPIRIN 81 MG PO TBEC: 81.0000 mg | DELAYED_RELEASE_TABLET | Freq: Every day | ORAL | 1 refills | Status: AC

## 2021-06-22 MED ORDER — ATORVASTATIN CALCIUM 80 MG PO TABS: 80.0000 mg | ORAL_TABLET | Freq: Every day | ORAL | 1 refills | Status: AC

## 2021-06-22 MED ORDER — AMLODIPINE BESYLATE 10 MG PO TABS: 10.0000 mg | ORAL_TABLET | Freq: Every day | ORAL | 1 refills | Status: AC

## 2021-06-22 MED ORDER — COVID-19MRNA BIVAL VAC MODERNA 50 MCG/0.5ML IM SUSP
0.5000 mL | Freq: Once | INTRAMUSCULAR | Status: DC
  Filled 2021-06-22: qty 0.5

## 2021-06-22 MED ORDER — CARVEDILOL 12.5 MG PO TABS: 12.5000 mg | ORAL_TABLET | Freq: Two times a day (BID) | ORAL | 1 refills | Status: AC

## 2021-06-22 NOTE — Progress Notes (Signed)
Pt discharged via wheelchair by nursing to the [facility] entrance to meet the [employer] for pt discharge

## 2021-06-22 NOTE — Progress Notes (Signed)
Security brought the Patient Valuables Envelope to the pt's room; individually reviewed each item that was locked up for the pt and returned each item; pt signed the envelope

## 2021-06-22 NOTE — Discharge Summary (Signed)
Physician Discharge Summary  Jenna Powers [id number] DOB: [birth year] DOA: 06/14/2021  PCP: Lynnell Jude, MD  Admit date: 06/14/2021 Discharge date: 06/22/2021  Admitted From: Home Disposition:  Home  Discharge Condition:Stable CODE STATUS:FULL Diet recommendation: Heart Healthy    Brief/Interim Summary: Jenna Powers is an 80 year old female with past medical history significant for essential hypertension, anxiety/depression, hypothyroidism, CKD3a, tobacco use disorder, CVA, cognitive impairment, osteoarthritis, history of intracranial hemorrhage, Hx EtoH abuse who presented to [facility] ED via EMS on 10/26 for evaluation of weakness in her right hand.  In the ED, BP 227/115, temperature 97.7 F, HR 72, RR 16, SPO2 96% on room air.  Sodium 136, potassium 3.9, chloride 103, CO2 26, glucose 97, BUN 13, creatinine 1.16, AST 30, ALT 20, total bilirubin 0.9.  High sensitive troponin 8, WBC 7.7, hemoglobin 13.3, platelets 296.  COVID-19 PCR negative.  Influenza A/B PCR negative.  Urinalysis unrevealing.  EtOH level less than 10.  UDS negative.  CT head without contrast with no evidence of acute intracranial abnormality; persistent but decreased hypodensity site of a previous parenchymal hemorrhage posterior left frontal lobe, mild generalized parenchymal atrophy, chronic small vessel ischemic changes cerebral right matter.  Neurology was consulted; and recommended admission to the hospital service for further blood pressure control, neuroimaging and neuro consultation.  EDP consulted [facility] for further evaluation and management.  Patient was managed for TIA.  Her neurological deficits have resolved.  Blood pressure medications have been adjusted.  Psychiatry was also consulted during this hospitalization regarding an episode of delirium, lack of capacity.  Currently her mental status is stable.  Psychiatry did not recommend any inpatient admission.  PT/OT initially recommended skilled nursing facility  but she declined.  Patient will be discharged home with home health.  Following problems were addressed during her hospitalization:   Transient ischemic attack Hx CVA/intracranial hemorrhage Patient presenting to ED with transient right-sided weakness that is resolved.  Severely hypertensive on arrival.   Patient was noted to be severely hypertensive on admission, notable for recent intraparenchymal hemorrhage.  UDS negative.  CT head without contrast with no evidence of acute intracranial abnormality; persistent but decreased hypodensity site of a previous parenchymal hemorrhage posterior left frontal lobe, mild generalized parenchymal atrophy, chronic small vessel ischemic changes cerebral right matter.  MR brain without contrast no new acute intracranial infarct or other abnormality.  MRA head motion degraded with no large vessel occlusion, aneurysm or vascular abnormality appreciated.  Hemoglobin A1c 5.2.  LDL 216.  Carotid ultrasound with mild stenosis proximal right/left ICA, vertebral arteries patent with normal antegrade flow.  TTE with LVEF 60 to [id number], grade 2 diastolic dysfunction, LA moderately dilated, mild MR, no aortic stenosis, IVC normal, no intra-atrial shunt. --Aspirin 80 mg p.o. daily --Increased to atorvastatin to 80 mg p.o. daily --PT/OT recommended SNF placement, she declined,plan for [facility]   Hypertensive emergency On admission, BP noted to be 227/115, poorly controlled.  Likely contributing factor to her weakness as above.  Suspect medication noncompliance outpatient. --Amlodipine 10 mg p.o. daily --Losartan 100 mg PO daily --coreg 12.5 BID -- Blood pressures are better now.  Continue current regimen.   Hyperlipidemia: Total cholesterol 323, HDL 59, LDL 216, triglycerides 239. --Increased atorvastatin to 80 mg p.o. daily   CKD stage IIIa Baseline creatinine 1.1-1.2, stable. --Avoid nephrotoxins, renal dose all medications -Check BMP in a week    Anxiety/depression: --Sertraline 100 mg p.o. daily   Agitation: --resolved   Hypothyroidism: TSH elevated 6.526 on 09/10/2019.  Suspect medication noncompliance. --Levothyroxine 25 mcg p.o. daily -- Need repeat LFTs in 4-6 weeks   History of tobacco use disorder and alcohol use disorder: Counseled on need for complete cessation  Discharge Diagnoses:  Principal Problem:   Acute delirium Active Problems:   Essential hypertension   Anxiety   History of depression   Other specified hypothyroidism   Right hand weakness   Cerebral infarction [facility])    Discharge Instructions  Discharge Instructions     Diet - low sodium heart healthy   Complete by: As directed    Discharge instructions   Complete by: As directed    1)Please take prescribed medications as instructed 2) Please monitor your blood pressure at home 3)Follow up with your PCP in a week   Increase activity slowly   Complete by: As directed       Allergies as of 06/22/2021       Reactions   Codeine Other (See Comments)   Pt reports allergy but does not remember what it was        Medication List     STOP taking these medications    lisinopril 40 MG tablet Commonly known as: ZESTRIL   propranolol ER 80 MG 24 hr capsule Commonly known as: INDERAL LA   ZINC PO       TAKE these medications    acetaminophen 325 MG tablet Commonly known as: TYLENOL Take 650 mg by mouth every 6 (six) hours as needed for moderate pain.   AeroChamber Plus inhaler Use as instructed   amLODipine 10 MG tablet Commonly known as: NORVASC Take 1 tablet (10 mg total) by mouth daily. Start taking on: June 23, 2021   aspirin 81 MG EC tablet Take 1 tablet (81 mg total) by mouth daily. Swallow whole. Start taking on: June 23, 2021   atorvastatin 80 MG tablet Commonly known as: LIPITOR Take 1 tablet (80 mg total) by mouth daily. Start taking on: June 23, 2021 What changed:  medication strength how much  to take   carvedilol 12.5 MG tablet Commonly known as: COREG Take 1 tablet (12.5 mg total) by mouth 2 (two) times daily with a meal.   levothyroxine 25 MCG tablet Commonly known as: SYNTHROID Take 25 mcg by mouth every morning.   losartan 100 MG tablet Commonly known as: COZAAR Take 1 tablet (100 mg total) by mouth daily. Start taking on: June 23, 2021   multivitamin with minerals Tabs tablet Take 1 tablet by mouth daily.   sertraline 100 MG tablet Commonly known as: ZOLOFT Take 100 mg by mouth daily.   traMADol 50 MG tablet Commonly known as: ULTRAM Take 50 mg by mouth 3 (three) times daily as needed for moderate pain.   traZODone 100 MG tablet Commonly known as: DESYREL Take 200 mg by mouth at bedtime as needed for sleep.   VITAMIN D3 PO Take 1 tablet by mouth daily.               Durable Medical Equipment  (From admission, onward)           Start     Ordered   06/22/21 0912  For home use only DME Walker rolling  Once       Question Answer Comment  Walker: With 5 Inch Wheels   Patient needs a walker to treat with the following condition Weakness generalized      06/22/21 0912            Follow-up  Information     Lynnell Jude, MD. Schedule an appointment as soon as possible for a visit in 1 day(s).   Specialty: Family Medicine Contact information: [place] [id number] [phone]                Allergies  Allergen Reactions   Codeine Other (See Comments)    Pt reports allergy but does not remember what it was    Consultations: Neurology, psychiatry   Procedures/Studies: CT HEAD WO CONTRAST  Result Date: 06/14/2021 CLINICAL DATA:  Transient ischemic attack. Additional history provided: Weak grip. EXAM: CT HEAD WITHOUT CONTRAST TECHNIQUE: Contiguous axial images were obtained from the base of the skull through the vertex without intravenous contrast. COMPARISON:  Prior head CT examinations 04/18/2021 and  earlier. FINDINGS: Brain: Mild cerebral and cerebellar atrophy. Persistent although decreased hypodensity at site of a previous parenchymal hemorrhage within the posterior left frontal lobe, compatible with gliosis and possible mild residual edema. Background minimal patchy and ill-defined hypoattenuation within the cerebral white matter, nonspecific but compatible with chronic small vessel ischemic disease. There is no acute intracranial hemorrhage. No demarcated cortical infarct. No extra-axial fluid collection. No evidence of an intracranial mass. No midline shift. Vascular: No hyperdense vessel.  Atherosclerotic calcifications Skull: Normal. Negative for fracture or focal lesion. Sinuses/Orbits: Visualized orbits show no acute finding. Trace mucosal thickening within the bilateral ethmoid sinuses. IMPRESSION: No evidence of acute intracranial abnormality. Persistent although decreased hypodensity at site of a previous parenchymal hemorrhage within the posterior left frontal lobe. This is compatible with gliosis and possible mild residual edema. Background minimal chronic small-vessel ischemic changes within the cerebral white matter. Mild generalized parenchymal atrophy. Electronically Signed   By: Kellie Simmering D.O.   On: 06/14/2021 16:54   MR ANGIO HEAD WO CONTRAST  Result Date: 06/14/2021 CLINICAL DATA:  Initial evaluation for neuro deficit, stroke suspected. Transient right hand weakness. EXAM: MRI HEAD WITHOUT CONTRAST MRA HEAD WITHOUT CONTRAST TECHNIQUE: Multiplanar, multiecho pulse sequences of the brain and surrounding structures were obtained without intravenous contrast. Angiographic images of the Circle of Willis were obtained using MRA technique without intravenous contrast. Angiographic images of the neck were obtained using MRA technique without intravenous contrast. Carotid stenosis measurements (when applicable) are obtained utilizing NASCET criteria, using the distal internal carotid  diameter as the denominator. COMPARISON:  Prior CT from earlier the same day as well as previous MRI from 03/14/2021. FINDINGS: MRI HEAD FINDINGS Brain: Examination degraded by motion artifact. Additionally, patient was unable to tolerate the full length of the study. Age-related cerebral atrophy again noted. Underlying minor chronic microvascular ischemic disease for age. There has been continued interval evolution of previously identified intraparenchymal hemorrhage at the posterior left frontal vertex, subacute in appearance with associated restricted diffusion. Hematoma has decreased in size and partially contracted now measuring 1.9 x 0.9 cm (series 5, image 41). No significant surrounding edema. Residual 9 mm focus of susceptibility artifact at site of previously seen right periatrial hemorrhage (series 13, image 31). No other new acute intracranial hemorrhage evident on this motion degraded exam. No evidence for acute or subacute ischemia elsewhere. Gray-white matter differentiation otherwise maintained. No other mass lesion, mass effect or midline shift. No hydrocephalus or extra-axial fluid collection. Pituitary gland suprasellar region within normal limits. Midline structures intact. Vascular: Major intracranial vascular flow voids are maintained at the skull base. Skull and upper cervical spine: Craniocervical junction within normal limits. Bone marrow signal intensity normal. No scalp soft tissue abnormality. Sinuses/Orbits:  Globes orbital soft tissues within normal limits. Paranasal sinuses are largely clear. No significant mastoid effusion. Inner ear structures grossly normal. Other: None. MRA HEAD FINDINGS ANTERIOR CIRCULATION: Examination degraded by motion artifact. Visualized distal cervical segments of the internal carotid arteries are patent with antegrade flow. Petrous segments patent bilaterally. Probable mild atheromatous change within the carotid siphons without hemodynamically significant  stenosis. 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 Small-vessel again seen coursing from the tip, consistent with a small vascular infundibulum (series 1, image 75). This is stable from prior. A1 segments widely patent. Normal anterior communicating artery complex. Anterior cerebral arteries patent without high-grade stenosis. No M1 stenosis or occlusion. Normal MCA bifurcations. Distal MCA branches perfused and symmetric. POSTERIOR CIRCULATION: Visualized vertebral arteries patent without stenosis. Left vertebral artery dominant. Both PICA origins are patent and normal. Basilar patent without stenosis. Superior cerebellar arteries patent bilaterally. Both PCAs primarily supplied via the basilar well perfused to their distal aspects. No other aneurysm or vascular abnormality seen about the intracranial circulation. IMPRESSION: MRI HEAD IMPRESSION: 1. Technically limited exam due to patient's inability to tolerate the full length of the study and motion artifact. 2. 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 No significant surrounding edema. 3. No other new acute intracranial infarct or other abnormality. MRA HEAD IMPRESSION: 1. Motion degraded exam. 2. Stable appearance of the intracranial circulation with no large vessel occlusion or hemodynamically significant stenosis. 3. 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 No aneurysm or other vascular abnormality. Please note that while an MRA of the neck was also ordered as a part of this exam, the patient was unable to tolerate the full length of the study. Consider re-attempt when the patient is able to tolerate. Alternatively, carotid Doppler ultrasound of the neck could be performed as well. Electronically Signed   By: Jeannine Boga M.D.   On: 06/14/2021 20:42   MR  ANGIO NECK WO CONTRAST  Result Date: 06/14/2021 CLINICAL DATA:  Initial evaluation for neuro deficit, stroke suspected. Transient right hand weakness. EXAM: MRI HEAD WITHOUT CONTRAST MRA HEAD WITHOUT CONTRAST TECHNIQUE: Multiplanar, multiecho pulse sequences of the brain and surrounding structures were obtained without intravenous contrast. Angiographic images of the Circle of Willis were obtained using MRA technique without intravenous contrast. Angiographic images of the neck were obtained using MRA technique without intravenous contrast. Carotid stenosis measurements (when applicable) are obtained utilizing NASCET criteria, using the distal internal carotid diameter as the denominator. COMPARISON:  Prior CT from earlier the same day as well as previous MRI from 03/14/2021. FINDINGS: MRI HEAD FINDINGS Brain: Examination degraded by motion artifact. Additionally, patient was unable to tolerate the full length of the study. Age-related cerebral atrophy again noted. Underlying minor chronic microvascular ischemic disease for age. There has been continued interval evolution of previously identified intraparenchymal hemorrhage at the posterior left frontal vertex, subacute in appearance with associated restricted diffusion. Hematoma has decreased in size and partially contracted now measuring 1.9 x 0.9 cm (series 5, image 41). No significant surrounding edema. Residual 9 mm focus of susceptibility artifact at site of previously seen right periatrial hemorrhage (series 13, image 31). No other new acute intracranial hemorrhage evident on this motion degraded exam. No evidence for acute or subacute ischemia elsewhere. Gray-white matter differentiation otherwise maintained. No other mass lesion, mass effect or midline shift. No hydrocephalus or extra-axial fluid  collection. Pituitary gland suprasellar region within normal limits. Midline structures intact. Vascular: Major intracranial vascular flow voids are maintained  at the skull base. Skull and upper cervical spine: Craniocervical junction within normal limits. Bone marrow signal intensity normal. No scalp soft tissue abnormality. Sinuses/Orbits: Globes orbital soft tissues within normal limits. Paranasal sinuses are largely clear. No significant mastoid effusion. Inner ear structures grossly normal. Other: None. MRA HEAD FINDINGS ANTERIOR CIRCULATION: Examination degraded by motion artifact. Visualized distal cervical segments of the internal carotid arteries are patent with antegrade flow. Petrous segments patent bilaterally. Probable mild atheromatous change within the carotid siphons without hemodynamically significant stenosis. 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 Small-vessel again seen coursing from the tip, consistent with a small vascular infundibulum (series 1, image 75). This is stable from prior. A1 segments widely patent. Normal anterior communicating artery complex. Anterior cerebral arteries patent without high-grade stenosis. No M1 stenosis or occlusion. Normal MCA bifurcations. Distal MCA branches perfused and symmetric. POSTERIOR CIRCULATION: Visualized vertebral arteries patent without stenosis. Left vertebral artery dominant. Both PICA origins are patent and normal. Basilar patent without stenosis. Superior cerebellar arteries patent bilaterally. Both PCAs primarily supplied via the basilar well perfused to their distal aspects. No other aneurysm or vascular abnormality seen about the intracranial circulation. IMPRESSION: MRI HEAD IMPRESSION: 1. Technically limited exam due to patient's inability to tolerate the full length of the study and motion artifact. 2. 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 No significant surrounding edema. 3. No other new acute intracranial infarct or other  abnormality. MRA HEAD IMPRESSION: 1. Motion degraded exam. 2. Stable appearance of the intracranial circulation with no large vessel occlusion or hemodynamically significant stenosis. 3. 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 No aneurysm or other vascular abnormality. Please note that while an MRA of the neck was also ordered as a part of this exam, the patient was unable to tolerate the full length of the study. Consider re-attempt when the patient is able to tolerate. Alternatively, carotid Doppler ultrasound of the neck could be performed as well. Electronically Signed   By: Jeannine Boga M.D.   On: 06/14/2021 20:42   MR BRAIN WO CONTRAST  Result Date: 06/14/2021 CLINICAL DATA:  Initial evaluation for neuro deficit, stroke suspected. Transient right hand weakness. EXAM: MRI HEAD WITHOUT CONTRAST MRA HEAD WITHOUT CONTRAST TECHNIQUE: Multiplanar, multiecho pulse sequences of the brain and surrounding structures were obtained without intravenous contrast. Angiographic images of the Circle of Willis were obtained using MRA technique without intravenous contrast. Angiographic images of the neck were obtained using MRA technique without intravenous contrast. Carotid stenosis measurements (when applicable) are obtained utilizing NASCET criteria, using the distal internal carotid diameter as the denominator. COMPARISON:  Prior CT from earlier the same day as well as previous MRI from 03/14/2021. FINDINGS: MRI HEAD FINDINGS Brain: Examination degraded by motion artifact. Additionally, patient was unable to tolerate the full length of the study. Age-related cerebral atrophy again noted. Underlying minor chronic microvascular ischemic disease for age. There has been continued interval evolution of previously identified intraparenchymal hemorrhage at the posterior left frontal vertex, subacute in appearance with associated restricted diffusion. Hematoma has decreased in size  and partially contracted now measuring 1.9 x 0.9 cm (series 5, image 41). No significant surrounding edema. Residual 9 mm focus of susceptibility artifact at site of previously seen right periatrial  hemorrhage (series 13, image 31). No other new acute intracranial hemorrhage evident on this motion degraded exam. No evidence for acute or subacute ischemia elsewhere. Gray-white matter differentiation otherwise maintained. No other mass lesion, mass effect or midline shift. No hydrocephalus or extra-axial fluid collection. Pituitary gland suprasellar region within normal limits. Midline structures intact. Vascular: Major intracranial vascular flow voids are maintained at the skull base. Skull and upper cervical spine: Craniocervical junction within normal limits. Bone marrow signal intensity normal. No scalp soft tissue abnormality. Sinuses/Orbits: Globes orbital soft tissues within normal limits. Paranasal sinuses are largely clear. No significant mastoid effusion. Inner ear structures grossly normal. Other: None. MRA HEAD FINDINGS ANTERIOR CIRCULATION: Examination degraded by motion artifact. Visualized distal cervical segments of the internal carotid arteries are patent with antegrade flow. Petrous segments patent bilaterally. Probable mild atheromatous change within the carotid siphons without hemodynamically significant stenosis. 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 Small-vessel again seen coursing from the tip, consistent with a small vascular infundibulum (series 1, image 75). This is stable from prior. A1 segments widely patent. Normal anterior communicating artery complex. Anterior cerebral arteries patent without high-grade stenosis. No M1 stenosis or occlusion. Normal MCA bifurcations. Distal MCA branches perfused and symmetric. POSTERIOR CIRCULATION: Visualized vertebral arteries patent without stenosis. Left vertebral artery dominant. Both PICA origins  are patent and normal. Basilar patent without stenosis. Superior cerebellar arteries patent bilaterally. Both PCAs primarily supplied via the basilar well perfused to their distal aspects. No other aneurysm or vascular abnormality seen about the intracranial circulation. IMPRESSION: MRI HEAD IMPRESSION: 1. Technically limited exam due to patient's inability to tolerate the full length of the study and motion artifact. 2. 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 No significant surrounding edema. 3. No other new acute intracranial infarct or other abnormality. MRA HEAD IMPRESSION: 1. Motion degraded exam. 2. Stable appearance of the intracranial circulation with no large vessel occlusion or hemodynamically significant stenosis. 3. 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 No aneurysm or other vascular abnormality. Please note that while an MRA of the neck was also ordered as a part of this exam, the patient was unable to tolerate the full length of the study. Consider re-attempt when the patient is able to tolerate. Alternatively, carotid Doppler ultrasound of the neck could be performed as well. Electronically Signed   By: Jeannine Boga M.D.   On: 06/14/2021 20:42   US Carotid Bilateral  Result Date: 06/15/2021 CLINICAL DATA:  Transient ischemic attack EXAM: BILATERAL CAROTID DUPLEX ULTRASOUND TECHNIQUE: Pearline Cables scale imaging, color Doppler and duplex ultrasound were performed of bilateral carotid and vertebral arteries in the neck. COMPARISON:  None. FINDINGS: Criteria: Quantification of carotid stenosis is based on velocity parameters that correlate the residual internal carotid diameter with NASCET-based stenosis levels, using the diameter of the distal internal carotid lumen as the denominator for stenosis measurement. The following velocity measurements were  obtained: RIGHT ICA: 69/11 cm/sec CCA: [id number] cm/sec SYSTOLIC ICA/CCA RATIO:  1.4 ECA:  83 cm/sec LEFT ICA: 72/21 cm/sec CCA: [id number] cm/sec SYSTOLIC ICA/CCA RATIO:  1.4 ECA:  98 cm/sec RIGHT CAROTID ARTERY: Mild smooth heterogeneous atherosclerotic plaque in the proximal internal carotid artery. By peak systolic velocity criteria, the estimated stenosis is less than 50%. RIGHT VERTEBRAL ARTERY:  Patent with normal antegrade flow. LEFT CAROTID ARTERY: Mild smooth heterogeneous atherosclerotic plaque in the proximal  internal carotid artery. Arteries are highly tortuous. By peak systolic velocity criteria, the estimated stenosis is less than 50%. LEFT VERTEBRAL ARTERY:  Patent with normal antegrade flow. IMPRESSION: 1. 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 2. Mild (1-49%) stenosis proximal left internal carotid artery secondary to smooth heterogeneous atherosclerotic plaque. 3. Vertebral arteries are patent with normal antegrade flow. Signed, Criselda Peaches, MD, [facility] Radiology Electronically Signed   By: Jacqulynn Cadet M.D.   On: 06/15/2021 16:54   ECHOCARDIOGRAM COMPLETE BUBBLE STUDY  Result Date: 06/16/2021    ECHOCARDIOGRAM REPORT   Patient Name:   Jenna Powers Date of Exam: 06/16/2021 Medical Rec #:  [id number]         Height:       66.0 in Accession #:    [id number]        Weight:       143.5 lb Date of Birth:  10-18-1940         BSA:          1.737 m Patient Age:    69 years          BP:           167/72 mmHg Patient Gender: F                 HR:           75 bpm. Exam Location:  [facility] Procedure: 2D Echo, Cardiac Doppler, Color Doppler, Saline Contrast Bubble Study            and Strain Analysis Indications:     Stroke 434.91 ?I63.9  History:         Patient has prior history of Echocardiogram examinations, most                  recent 03/15/2021. Stroke; Risk  Factors:Dyslipidemia and                  Hypertension.  Sonographer:     Sherrie Sport Referring Phys:  [id number] ERIC J [place] Diagnosing Phys: Ida Rogue MD  Sonographer Comments: Global longitudinal strain was attempted. IMPRESSIONS  1. Left ventricular ejection fraction, by estimation, is 60 to 65%. The left ventricle has normal function. The left ventricle has no regional wall motion abnormalities. Left ventricular diastolic parameters are consistent with Grade II diastolic dysfunction (pseudonormalization). The average left ventricular global longitudinal strain is -17.1 %. The global longitudinal strain is normal.  2. Right ventricular systolic function is normal. The right ventricular size is normal. There is mildly elevated pulmonary artery systolic pressure. The estimated right ventricular systolic pressure is Q000111Q mmHg.  3. Left atrial size was moderately dilated.  4. The mitral valve is normal in structure. Mild mitral valve regurgitation. No evidence of mitral stenosis.  5. Tricuspid valve regurgitation is mild to moderate.  6. The aortic valve is normal in structure. Aortic valve regurgitation is not visualized. Mild to moderate aortic valve sclerosis/calcification is present, without any evidence of aortic stenosis.  7. The inferior vena cava is normal in size with greater than 50% respiratory variability, suggesting right atrial pressure of 3 mmHg.  8. Agitated saline contrast bubble study was negative, with no evidence of any interatrial shunt. FINDINGS  Left Ventricle: Left ventricular ejection fraction, by estimation, is 60 to 65%. The left ventricle has normal function. The left ventricle has no regional wall motion abnormalities. The average left  ventricular global longitudinal strain is -17.1 %. The global longitudinal strain is normal. The left ventricular internal cavity size was normal in size. There is no left ventricular hypertrophy. Left ventricular diastolic parameters are consistent  with Grade II diastolic dysfunction (pseudonormalization). Right Ventricle: The right ventricular size is normal. No increase in right ventricular wall thickness. Right ventricular systolic function is normal. There is mildly elevated pulmonary artery systolic pressure. The tricuspid regurgitant velocity is 3.12  m/s, and with an assumed right atrial pressure of 5 mmHg, the estimated right ventricular systolic pressure is Q000111Q mmHg. Left Atrium: Left atrial size was moderately dilated. Right Atrium: Right atrial size was normal in size. Pericardium: There is no evidence of pericardial effusion. Mitral Valve: The mitral valve is normal in structure. Mild mitral valve regurgitation. No evidence of mitral valve stenosis. MV peak gradient, 4.5 mmHg. The mean mitral valve gradient is 2.0 mmHg. Tricuspid Valve: The tricuspid valve is normal in structure. Tricuspid valve regurgitation is mild to moderate. No evidence of tricuspid stenosis. Aortic Valve: The aortic valve is normal in structure. Aortic valve regurgitation is not visualized. Mild to moderate aortic valve sclerosis/calcification is present, without any evidence of aortic stenosis. Aortic valve mean gradient measures 4.5 mmHg. Aortic valve peak gradient measures 7.1 mmHg. Aortic valve area, by VTI measures 1.67 cm. Pulmonic Valve: The pulmonic valve was normal in structure. Pulmonic valve regurgitation is not visualized. No evidence of pulmonic stenosis. Aorta: The aortic root is normal in size and structure. Venous: The inferior vena cava is normal in size with greater than 50% respiratory variability, suggesting right atrial pressure of 3 mmHg. IAS/Shunts: No atrial level shunt detected by color flow Doppler. Agitated saline contrast was given intravenously to evaluate for intracardiac shunting. Agitated saline contrast bubble study was negative, with no evidence of any interatrial shunt.  LEFT VENTRICLE PLAX 2D LVIDd:         4.30 cm   Diastology LVIDs:          3.00 cm   LV e' medial:    4.35 cm/s LV PW:         1.10 cm   LV E/e' medial:  21.1 LV IVS:        0.80 cm   LV e' lateral:   9.90 cm/s LVOT diam:     2.00 cm   LV E/e' lateral: 9.3 LV SV:         46 LV SV Index:   26        2D Longitudinal Strain LVOT Area:     3.14 cm  2D Strain GLS Avg:     -17.1 %  RIGHT VENTRICLE RV Basal diam:  3.10 cm RV S prime:     16.80 cm/s TAPSE (M-mode): 3.4 cm LEFT ATRIUM           Index        RIGHT ATRIUM           Index LA diam:      3.70 cm 2.13 cm/m   RA Area:     15.60 cm LA Vol (A2C): 91.8 ml 52.86 ml/m  RA Volume:   41.50 ml  23.90 ml/m LA Vol (A4C): 32.7 ml 18.83 ml/m  AORTIC VALVE                    PULMONIC VALVE AV Area (Vmax):    1.78 cm     PV Vmax:        0.77  m/s AV Area (Vmean):   1.61 cm     PV Vmean:       49.100 cm/s AV Area (VTI):     1.67 cm     PV VTI:         0.138 m AV Vmax:           133.50 cm/s  PV Peak grad:   2.4 mmHg AV Vmean:          99.250 cm/s  PV Mean grad:   1.0 mmHg AV VTI:            0.272 m      RVOT Peak grad: 3 mmHg AV Peak Grad:      7.1 mmHg AV Mean Grad:      4.5 mmHg LVOT Vmax:         75.70 cm/s LVOT Vmean:        50.900 cm/s LVOT VTI:          0.145 m LVOT/AV VTI ratio: 0.53  AORTA Ao Root diam: 3.17 cm MITRAL VALVE               TRICUSPID VALVE MV Area (PHT): 3.28 cm    TR Peak grad:   38.9 mmHg MV Area VTI:   1.72 cm    TR Vmax:        312.00 cm/s MV Peak grad:  4.5 mmHg MV Mean grad:  2.0 mmHg    SHUNTS MV Vmax:       1.06 m/s    Systemic VTI:  0.14 m MV Vmean:      62.5 cm/s   Systemic Diam: 2.00 cm MV Decel Time: 231 msec    Pulmonic VTI:  0.167 m MV E velocity: 91.70 cm/s MV A velocity: 46.30 cm/s MV E/A ratio:  1.98 Ida Rogue MD Electronically signed by Ida Rogue MD Signature Date/Time: 06/16/2021/6:36:23 PM    Final       Subjective: Patient seen and examined at the bedside this morning.  Hemodynamically stable for discharge  Discharge Exam: Vitals:   06/22/21 0510 06/22/21 0744  BP: (!) 151/76  (!) 180/67  Pulse: 67 (!) 58  Resp: 17 18  Temp: 98.1 F (36.7 C) 97.8 F (36.6 C)  SpO2: 96% 95%   Vitals:   06/21/21 1943 06/21/21 2309 06/22/21 0510 06/22/21 0744  BP: (!) 150/82 (!) 156/78 (!) 151/76 (!) 180/67  Pulse: 79 73 67 (!) 58  Resp: 20 16 17 18   Temp: 98.1 F (36.7 C) 97.8 F (36.6 C) 98.1 F (36.7 C) 97.8 F (36.6 C)  TempSrc: Oral Oral  Oral  SpO2: 97% 97% 96% 95%  Weight:      Height:        General: Pt is alert, awake, not in acute distress Cardiovascular: RRR, S1/S2 +, no rubs, no gallops Respiratory: CTA bilaterally, no wheezing, no rhonchi Abdominal: Soft, NT, ND, bowel sounds + Extremities: no edema, no cyanosis    The results of significant diagnostics from this hospitalization (including imaging, microbiology, ancillary and laboratory) are listed below for reference.     Microbiology: Recent Results (from the past 240 hour(s))  Resp Panel by RT-PCR (Flu A&B, Covid) Nasopharyngeal Swab     Status: None   Collection Time: 06/14/21  5:51 PM   Specimen: Nasopharyngeal Swab; Nasopharyngeal(NP) swabs in vial transport medium  Result Value Ref Range Status   SARS Coronavirus 2 by RT PCR NEGATIVE NEGATIVE Final    Comment: (NOTE) SARS-CoV-2  target nucleic acids are NOT DETECTED.  The SARS-CoV-2 RNA is generally detectable in upper respiratory specimens during the acute phase of infection. The lowest concentration of SARS-CoV-2 viral copies this assay can detect is 138 copies/mL. A negative result does not preclude SARS-Cov-2 infection and should not be used as the sole basis for treatment or other patient management decisions. A negative result may occur with  improper specimen collection/handling, submission of specimen other than nasopharyngeal swab, presence of viral mutation(s) within the areas targeted by this assay, and inadequate number of viral copies(<138 copies/mL). A negative result must be combined with clinical observations, patient  history, and epidemiological information. The expected result is Negative.  Fact Sheet for Patients:  BloggerCourse.com  Fact Sheet for Healthcare Providers:  SeriousBroker.it  This test is no t yet approved or cleared by the [place] FDA and  has been authorized for detection and/or diagnosis of SARS-CoV-2 by FDA under an Emergency Use Authorization (EUA). This EUA will remain  in effect (meaning this test can be used) for the duration of the COVID-19 declaration under Section 564(b)(1) of the Act, 21 U.S.C.section 360bbb-3(b)(1), unless the authorization is terminated  or revoked sooner.       Influenza A by PCR NEGATIVE NEGATIVE Final   Influenza B by PCR NEGATIVE NEGATIVE Final    Comment: (NOTE) The Xpert Xpress SARS-CoV-2/FLU/RSV plus assay is intended as an aid in the diagnosis of influenza from Nasopharyngeal swab specimens and should not be used as a sole basis for treatment. Nasal washings and aspirates are unacceptable for Xpert Xpress SARS-CoV-2/FLU/RSV testing.  Fact Sheet for Patients: BloggerCourse.com  Fact Sheet for Healthcare Providers: SeriousBroker.it  This test is not yet approved or cleared by the [place] FDA and has been authorized for detection and/or diagnosis of SARS-CoV-2 by FDA under an Emergency Use Authorization (EUA). This EUA will remain in effect (meaning this test can be used) for the duration of the COVID-19 declaration under Section 564(b)(1) of the Act, 21 U.S.C. section 360bbb-3(b)(1), unless the authorization is terminated or revoked.  Performed at [facility], [address] [place], [place] [address]      Labs: BNP (last 3 results) No results for input(s): BNP in the last 8760 hours. Basic Metabolic Panel: Recent Labs  Lab 06/19/21 0422 06/21/21 0554  NA 138 138  K 4.1 4.4  CL 105 106  CO2 26 25   GLUCOSE 109* 111*  BUN 22 23  CREATININE 1.17* 1.06*  CALCIUM 9.6 9.1  MG 1.9  --    Liver Function Tests: No results for input(s): AST, ALT, ALKPHOS, BILITOT, PROT, ALBUMIN in the last 168 hours. No results for input(s): LIPASE, AMYLASE in the last 168 hours. No results for input(s): AMMONIA in the last 168 hours. CBC: No results for input(s): WBC, NEUTROABS, HGB, HCT, MCV, PLT in the last 168 hours. Cardiac Enzymes: No results for input(s): CKTOTAL, CKMB, CKMBINDEX, TROPONINI in the last 168 hours. BNP: Invalid input(s): POCBNP CBG: Recent Labs  Lab 06/18/21 2048  GLUCAP 100*   D-Dimer No results for input(s): DDIMER in the last 72 hours. Hgb A1c No results for input(s): HGBA1C in the last 72 hours. Lipid Profile No results for input(s): CHOL, HDL, LDLCALC, TRIG, CHOLHDL, LDLDIRECT in the last 72 hours. Thyroid function studies No results for input(s): TSH, T4TOTAL, T3FREE, THYROIDAB in the last 72 hours.  Invalid input(s): FREET3 Anemia work up No results for input(s): VITAMINB12, FOLATE, FERRITIN, TIBC, IRON, RETICCTPCT in the last  72 hours. Urinalysis    Component Value Date/Time   COLORURINE STRAW (A) 06/14/2021 1751   APPEARANCEUR CLEAR (A) 06/14/2021 1751   APPEARANCEUR Hazy 06/13/2013 1729   LABSPEC 1.005 06/14/2021 1751   LABSPEC 1.013 06/13/2013 1729   PHURINE 8.0 06/14/2021 1751   GLUCOSEU NEGATIVE 06/14/2021 1751   GLUCOSEU Negative 06/13/2013 1729   HGBUR NEGATIVE 06/14/2021 1751   BILIRUBINUR NEGATIVE 06/14/2021 1751   BILIRUBINUR Negative 06/13/2013 1729   KETONESUR NEGATIVE 06/14/2021 1751   PROTEINUR NEGATIVE 06/14/2021 1751   NITRITE NEGATIVE 06/14/2021 1751   LEUKOCYTESUR NEGATIVE 06/14/2021 1751   LEUKOCYTESUR Trace 06/13/2013 1729   Sepsis Labs Invalid input(s): PROCALCITONIN,  WBC,  LACTICIDVEN Microbiology Recent Results (from the past 240 hour(s))  Resp Panel by RT-PCR (Flu A&B, Covid) Nasopharyngeal Swab     Status: None    Collection Time: 06/14/21  5:51 PM   Specimen: Nasopharyngeal Swab; Nasopharyngeal(NP) swabs in vial transport medium  Result Value Ref Range Status   SARS Coronavirus 2 by RT PCR NEGATIVE NEGATIVE Final    Comment: (NOTE) SARS-CoV-2 target nucleic acids are NOT DETECTED.  The SARS-CoV-2 RNA is generally detectable in upper respiratory specimens during the acute phase of infection. The lowest concentration of SARS-CoV-2 viral copies this assay can detect is 138 copies/mL. A negative result does not preclude SARS-Cov-2 infection and should not be used as the sole basis for treatment or other patient management decisions. A negative result may occur with  improper specimen collection/handling, submission of specimen other than nasopharyngeal swab, presence of viral mutation(s) within the areas targeted by this assay, and inadequate number of viral copies(<138 copies/mL). A negative result must be combined with clinical observations, patient history, and epidemiological information. The expected result is Negative.  Fact Sheet for Patients:  EntrepreneurPulse.com.au  Fact Sheet for Healthcare Providers:  IncredibleEmployment.be  This test is no t yet approved or cleared by the [place] FDA and  has been authorized for detection and/or diagnosis of SARS-CoV-2 by FDA under an Emergency Use Authorization (EUA). This EUA will remain  in effect (meaning this test can be used) for the duration of the COVID-19 declaration under Section 564(b)(1) of the Act, 21 U.S.C.section 360bbb-3(b)(1), unless the authorization is terminated  or revoked sooner.       Influenza A by PCR NEGATIVE NEGATIVE Final   Influenza B by PCR NEGATIVE NEGATIVE Final    Comment: (NOTE) The Xpert Xpress SARS-CoV-2/FLU/RSV plus assay is intended as an aid in the diagnosis of influenza from Nasopharyngeal swab specimens and should not be used as a sole basis for treatment.  Nasal washings and aspirates are unacceptable for Xpert Xpress SARS-CoV-2/FLU/RSV testing.  Fact Sheet for Patients: EntrepreneurPulse.com.au  Fact Sheet for Healthcare Providers: IncredibleEmployment.be  This test is not yet approved or cleared by the [place] FDA and has been authorized for detection and/or diagnosis of SARS-CoV-2 by FDA under an Emergency Use Authorization (EUA). This EUA will remain in effect (meaning this test can be used) for the duration of the COVID-19 declaration under Section 564(b)(1) of the Act, 21 U.S.C. section 360bbb-3(b)(1), unless the authorization is terminated or revoked.  Performed at [facility], [address], [place], [place] [address]     Please note: You were cared for by a hospitalist during your hospital stay. Once you are discharged, your primary care physician will handle any further medical issues. Please note that NO REFILLS for any discharge medications will be authorized once you are discharged, as it  is imperative that you return to your primary care physician (or establish a relationship with a primary care physician if you do not have one) for your post hospital discharge needs so that they can reassess your need for medications and monitor your lab values.    Time coordinating discharge: 40 minutes  SIGNED:   Shelly Coss, MD  [facility] 06/22/2021, 9:43 AM Pager [phone]  If 7PM-7AM, please contact night-coverage [email or web address] Password [email or web address]

## 2021-06-22 NOTE — Progress Notes (Signed)
Mobility Specialist - Progress Note   06/22/21 1400  Mobility  Activity Ambulated to bathroom;Stood at bedside;Transferred:  Bed to chair  Level of Assistance Standby assist, set-up cues, supervision of patient - no hands on  Assistive Device Front wheel walker  Distance Ambulated (ft) 40 ft  Mobility Ambulated with assistance in room;Out of bed to chair with meals;Out of bed for toileting  Mobility Response Tolerated well  Mobility performed by Mobility specialist  $Mobility charge 1 Mobility    Mobility responded to bed alarm, pt sitting EOB requesting assistance to bathroom. Ambulated with supervision and RW. ModI for seated peri-care. Requested to change into personal clothes, minA to don/doff pants in seated position. Pt stood at sink to brush teeth and apply lipstick. Pt ambulated to recliner where she was left with needs in reach and alarm set. Anticipating d/c later this date.    Filiberto Pinks Mobility Specialist 06/22/21, 2:05 PM

## 2021-06-22 NOTE — Progress Notes (Signed)
Physical Therapy Treatment Patient Details Name: Jenna Powers MRN: [id number] DOB: 1941-03-24 Today's Date: 06/22/2021   History of Present Illness 80 yo female presented to ED with complaints of right hand weakness. Recent admission with 3 ICHs+ midline shift, 7/22. PMHx includes OA, depression, HLD, HTN, neuropathy, thyroid disease, tremor.    PT Comments    Pt alert, seated in bed w/ HOB elevated. Pt is agreeable to therapy and overall pleasant and cooperative. Pt continues to demonstrate poor safety awareness when ambulating with RW and unable to maintain a linear projection when walking. Environment needs to be organized to maximize safety when amb w/ RW as pt was unable to navigate around obstacles without running into them. Pt did progress to stair training 4 steps w/ R hand rail. Pt ascended/descended steps w/ CGA & R hand rail w/ BUE. Pt is fearful of falling and initiates sequence with step-to pattern which is appropriate due to lack of balance with dynamic activities. Discharge recommendation is being updated to HHPT w/ frequent or constant supervision due to high risk of falls.    Recommendations for follow up therapy are one component of a multi-disciplinary discharge planning process, led by the attending physician.  Recommendations may be updated based on patient status, additional functional criteria and insurance authorization.  Follow Up Recommendations  Home health PT     Assistance Recommended at Discharge Frequent or constant Supervision/Assistance  Equipment Recommendations  Rolling walker (2 wheels)    Recommendations for Other Services       Precautions / Restrictions Precautions Precautions: Fall Restrictions Weight Bearing Restrictions: No     Mobility  Bed Mobility Overal bed mobility: Needs Assistance Bed Mobility: Supine to Sit;Sit to Supine     Supine to sit: Modified independent (Device/Increase time);HOB elevated Sit to supine: Modified  independent (Device/Increase time);HOB elevated        Transfers Overall transfer level: Needs assistance Equipment used: Rolling walker (2 wheels);None Transfers: Sit to/from Stand Sit to Stand: Min guard           General transfer comment: STS x 1 without AD to don pants while holding onto bed rail; cues for hand placement on RW    Ambulation/Gait Ambulation/Gait assistance: Supervision Gait Distance (Feet): 160 Feet Assistive device: Rolling walker (2 wheels) Gait Pattern/deviations: Step-to pattern;Narrow base of support Gait velocity: Decreased   General Gait Details: asymmetry w/ gait (L trunk abd w/ R pelvic frontal plane deviation) leading to narrow BOS and crossing feet; encouraged pt to keep RW straight with amb by staying off the "brown line" but unable to maintain linear projection; Left objects in room for pt to navigate around and pt does require assistance to move objects out of path.   Stairs Stairs: Yes Stairs assistance: Min guard Stair Management: One rail Right;Step to pattern Number of Stairs: 4 General stair comments: Pt lowers COM by crouching down for stability with BUE to R hand rail; pt does demonstrate slow thoughtful steps for safety; cues to ensure entire foot is placed on steps   Wheelchair Mobility    Modified Rankin (Stroke Patients Only)       Balance Overall balance assessment: Needs assistance Sitting-balance support: No upper extremity supported;Feet supported Sitting balance-Leahy Scale: Good Sitting balance - Comments: able to rotate trunk and shoulders with reaching without LOB     Standing balance-Leahy Scale: Poor Standing balance comment: Requires BUE support on RW to maintain stability  Cognition Arousal/Alertness: Awake/alert Behavior During Therapy: Impulsive Overall Cognitive Status: No family/caregiver present to determine baseline cognitive functioning                                  General Comments: Oriented x4, continues to demonstrate poor safety awareness with upright mobility        Exercises Other Exercises Other Exercises: Pt edu on keeping RW within BOS as pt reaches outside of BOS and does not turn walker during STS tasks    General Comments General comments (skin integrity, edema, etc.): BP: 134/69 (pre session), HR after stair training 87 bpm, SpO2 99%      Pertinent Vitals/Pain Pain Assessment: No/denies pain    Home Living                          Prior Function            PT Goals (current goals can now be found in the care plan section) Progress towards PT goals: Progressing toward goals    Frequency    Min 2X/week      PT Plan Discharge plan needs to be updated    Co-evaluation              AM-PAC PT "6 Clicks" Mobility   Outcome Measure  Help needed turning from your back to your side while in a flat bed without using bedrails?: None Help needed moving from lying on your back to sitting on the side of a flat bed without using bedrails?: None Help needed moving to and from a bed to a chair (including a wheelchair)?: A Little Help needed standing up from a chair using your arms (e.g., wheelchair or bedside chair)?: A Little Help needed to walk in hospital room?: A Little Help needed climbing 3-5 steps with a railing? : A Little 6 Click Score: 20    End of Session Equipment Utilized During Treatment: Gait belt Activity Tolerance: Patient tolerated treatment well Patient left: in chair;with call bell/phone within reach;with chair alarm set Nurse Communication: Mobility status PT Visit Diagnosis: Muscle weakness (generalized) (M62.81);Difficulty in walking, not elsewhere classified (R26.2);Unsteadiness on feet (R26.81);History of falling (Z91.81)     Time: 3748-2707 PT Time Calculation (min) (ACUTE ONLY): 25 min  Charges:                       [employer], SPT

## 2021-06-22 NOTE — Care Management Important Message (Signed)
Important Message  Patient Details  Name: Jenna Powers MRN: [id number] Date of Birth: 29-Jul-1941   Medicare Important Message Given:  Yes     [name] 06/22/2021, 11:40 AM

## 2021-06-22 NOTE — TOC Transition Note (Signed)
Transition of Care [facility]) - CM/SW Discharge Note   Patient Details  Name: Jenna Powers MRN: [id number] Date of Birth: 23-Sep-1940  Transition of Care [facility]) CM/SW Contact:  Allayne Butcher, RN Phone Number: 06/22/2021, 10:28 AM   Clinical Narrative:    Patient is medically cleared for discharge and psychiatry has also cleared patient.  Patient will discharge home with home health services through [place].  Patient requesting COVID vaccine before discharge- MD will order.  Patient reports that she also needs a ride home.  [facility] will be arranged to get patient home.  RW delivered to the room.   Final next level of care: Home w Home Health Services Barriers to Discharge: Barriers Resolved   Patient Goals and CMS Choice Patient states their goals for this hospitalization and ongoing recovery are:: Patient wants to go home and agrees to home health CMS Medicare.gov Compare Post Acute Care list provided to:: Patient Choice offered to / list presented to : Patient  Discharge Placement                       Discharge Plan and Services In-house Referral: Clinical Social Work Discharge Planning Services: CM Consult Post Acute Care Choice: Home Health          DME Arranged: Dan Humphreys rolling DME Agency: [employer] Date DME Agency Contacted: 06/22/21 Time DME Agency Contacted: 0930 Representative spoke with at DME Agency: Bjorn Loser HH Arranged: RN, PT [facility] Agency: [facility] Date [facility] Agency Contacted: 06/22/21 Time HH Agency Contacted: 1028 Representative spoke with at [facility] Agency: Maralyn Sago  Social Determinants of Health (SDOH) Interventions     Readmission Risk Interventions No flowsheet data found.

## 2021-06-22 NOTE — Progress Notes (Signed)
MD order received in [facility] to discharge pt home with home health today; TOC previously established Home Health with [facility]; DME/rolling walker previously delivered to the pt's home; verbally reviewed AVS with pt; no questions voiced by the pt; pt verbalized "I hope I am going to be able to remember to take these medications."  Encouraged pt to obtain a pill box and place her medications as ordered in them accordingly; pt's discharge pending arrival of [facility] for her ride home

## 2021-06-22 NOTE — Discharge Instructions (Signed)
[facility]

## 2021-06-22 NOTE — Progress Notes (Signed)
TOC, Jenna verbally advised that the [employer] [facility] will be at the [facility] entrance at 1500 to take the pt home

## 2021-06-22 NOTE — Plan of Care (Signed)
  Problem: Education: Goal: Knowledge of General Education information will improve Description: Including pain rating scale, medication(s)/side effects and non-pharmacologic comfort measures Outcome: Adequate for Discharge   Problem: Health Behavior/Discharge Planning: Goal: Ability to manage health-related needs will improve Outcome: Adequate for Discharge   Problem: Clinical Measurements: Goal: Ability to maintain clinical measurements within normal limits will improve Outcome: Adequate for Discharge Goal: Will remain free from infection Outcome: Adequate for Discharge Goal: Diagnostic test results will improve Outcome: Adequate for Discharge Goal: Respiratory complications will improve Outcome: Adequate for Discharge Goal: Cardiovascular complication will be avoided Outcome: Adequate for Discharge   Problem: Activity: Goal: Risk for activity intolerance will decrease Outcome: Adequate for Discharge   Problem: Nutrition: Goal: Adequate nutrition will be maintained Outcome: Adequate for Discharge   Problem: Coping: Goal: Level of anxiety will decrease Outcome: Adequate for Discharge   Problem: Elimination: Goal: Will not experience complications related to bowel motility Outcome: Adequate for Discharge Goal: Will not experience complications related to urinary retention Outcome: Adequate for Discharge   Problem: Pain Managment: Goal: General experience of comfort will improve Outcome: Adequate for Discharge   Problem: Safety: Goal: Ability to remain free from injury will improve Outcome: Adequate for Discharge   Problem: Skin Integrity: Goal: Risk for impaired skin integrity will decrease Outcome: Adequate for Discharge   Problem: Education: Goal: Knowledge of secondary prevention will improve (SELECT ALL) Outcome: Adequate for Discharge Goal: Knowledge of patient specific risk factors will improve (INDIVIDUALIZE FOR PATIENT) Outcome: Adequate for  Discharge Goal: Individualized Educational Video(s) Outcome: Adequate for Discharge   Problem: Ischemic Stroke/TIA Tissue Perfusion: Goal: Complications of ischemic stroke/TIA will be minimized Outcome: Adequate for Discharge

## 2021-10-09 NOTE — Progress Notes (Signed)
Response to coding query pertaining to my note from 7/26 regarding presence/absence of brain herniation: Brain herniation ruled out by MRI.   Electronically signed: Dr. Caryl Pina

## 2022-03-20 ENCOUNTER — Other Ambulatory Visit: Payer: Self-pay | Admitting: Nephrology

## 2022-03-20 DIAGNOSIS — N1831 Chronic kidney disease, stage 3a: Secondary | ICD-10-CM

## 2022-04-12 ENCOUNTER — Ambulatory Visit
Admission: RE | Admit: 2022-04-12 | Discharge: 2022-04-12 | Disposition: A | Discharge status: Home / Self Care | Payer: Medicare Other | Source: Ambulatory Visit | Attending: Nephrology | Admitting: Nephrology

## 2022-04-12 DIAGNOSIS — N1831 Chronic kidney disease, stage 3a: Secondary | ICD-10-CM | POA: Insufficient documentation

## 2022-05-29 ENCOUNTER — Other Ambulatory Visit: Payer: Self-pay | Admitting: Physician Assistant

## 2022-05-29 ENCOUNTER — Other Ambulatory Visit (HOSPITAL_COMMUNITY): Payer: Self-pay | Admitting: Physician Assistant

## 2022-05-29 DIAGNOSIS — Z8673 Personal history of transient ischemic attack (TIA), and cerebral infarction without residual deficits: Secondary | ICD-10-CM

## 2022-05-29 DIAGNOSIS — R2689 Other abnormalities of gait and mobility: Secondary | ICD-10-CM

## 2022-05-29 DIAGNOSIS — I824Y2 Acute embolism and thrombosis of unspecified deep veins of left proximal lower extremity: Secondary | ICD-10-CM

## 2022-05-29 DIAGNOSIS — M7989 Other specified soft tissue disorders: Secondary | ICD-10-CM

## 2022-05-29 DIAGNOSIS — R42 Dizziness and giddiness: Secondary | ICD-10-CM

## 2022-06-08 ENCOUNTER — Ambulatory Visit
Admission: RE | Admit: 2022-06-08 | Discharge: 2022-06-08 | Disposition: A | Discharge status: Home / Self Care | Payer: Medicare Other | Source: Ambulatory Visit | Attending: Physician Assistant | Admitting: Physician Assistant

## 2022-06-08 DIAGNOSIS — M7989 Other specified soft tissue disorders: Secondary | ICD-10-CM | POA: Insufficient documentation

## 2022-06-08 DIAGNOSIS — Z8673 Personal history of transient ischemic attack (TIA), and cerebral infarction without residual deficits: Secondary | ICD-10-CM | POA: Diagnosis present

## 2022-06-08 DIAGNOSIS — I824Y2 Acute embolism and thrombosis of unspecified deep veins of left proximal lower extremity: Secondary | ICD-10-CM | POA: Diagnosis present

## 2022-06-08 DIAGNOSIS — R2689 Other abnormalities of gait and mobility: Secondary | ICD-10-CM

## 2022-06-08 DIAGNOSIS — R42 Dizziness and giddiness: Secondary | ICD-10-CM | POA: Insufficient documentation

## 2022-06-21 ENCOUNTER — Emergency Department
Admission: EM | Admit: 2022-06-21 | Discharge: 2022-06-21 | Discharge status: Against Medical Advice | Payer: Medicare Other | Attending: Emergency Medicine | Admitting: Emergency Medicine

## 2022-06-21 ENCOUNTER — Other Ambulatory Visit: Payer: Self-pay

## 2022-06-21 DIAGNOSIS — R030 Elevated blood-pressure reading, without diagnosis of hypertension: Secondary | ICD-10-CM | POA: Diagnosis not present

## 2022-06-21 DIAGNOSIS — R5383 Other fatigue: Secondary | ICD-10-CM | POA: Diagnosis not present

## 2022-06-21 DIAGNOSIS — Z5321 Procedure and treatment not carried out due to patient leaving prior to being seen by health care provider: Secondary | ICD-10-CM | POA: Insufficient documentation

## 2022-06-21 DIAGNOSIS — R531 Weakness: Secondary | ICD-10-CM | POA: Diagnosis present

## 2022-06-21 LAB — BASIC METABOLIC PANEL
Anion gap: 7 (ref 5–15)
BUN: 19 mg/dL (ref 8–23)
CO2: 25 mmol/L (ref 22–32)
Calcium: 9.3 mg/dL (ref 8.9–10.3)
Chloride: 106 mmol/L (ref 98–111)
Creatinine, Ser: 1.23 mg/dL — ABNORMAL HIGH (ref 0.44–1.00)
GFR, Estimated: 44 mL/min — ABNORMAL LOW (ref >60)
Glucose, Bld: 108 mg/dL — ABNORMAL HIGH (ref 70–99)
Potassium: 4 mmol/L (ref 3.5–5.1)
Sodium: 138 mmol/L (ref 135–145)

## 2022-06-21 LAB — CBC
HCT: 39.6 % (ref 36.0–46.0)
Hemoglobin: 12.8 g/dL (ref 12.0–15.0)
MCH: 29.6 pg (ref 26.0–34.0)
MCHC: 32.3 g/dL (ref 30.0–36.0)
MCV: 91.5 fL (ref 80.0–100.0)
Platelets: 270 10*3/uL (ref 150–400)
RBC: 4.33 MIL/uL (ref 3.87–5.11)
RDW: 13.2 % (ref 11.5–15.5)
WBC: 8.1 10*3/uL (ref 4.0–10.5)
nRBC: 0 % (ref 0.0–0.2)

## 2022-06-21 NOTE — ED Triage Notes (Signed)
Pt comes iwht c.o increased weakness and fatigue. Pt stats her BP was elevated today. Pt states she does take BP meds.   Current BP is 169/108

## 2022-11-15 ENCOUNTER — Other Ambulatory Visit: Payer: Self-pay

## 2022-11-15 ENCOUNTER — Encounter: Payer: Self-pay | Admitting: Emergency Medicine

## 2022-11-15 ENCOUNTER — Emergency Department
Admission: EM | Admit: 2022-11-15 | Discharge: 2022-11-15 | Disposition: A | Discharge status: Home / Self Care | Payer: Medicare Other | Attending: Emergency Medicine | Admitting: Emergency Medicine

## 2022-11-15 DIAGNOSIS — I10 Essential (primary) hypertension: Secondary | ICD-10-CM | POA: Diagnosis not present

## 2022-11-15 DIAGNOSIS — R030 Elevated blood-pressure reading, without diagnosis of hypertension: Secondary | ICD-10-CM | POA: Diagnosis present

## 2022-11-15 LAB — CBC WITH DIFFERENTIAL/PLATELET
Abs Immature Granulocytes: 0.01 10*3/uL (ref 0.00–0.07)
Basophils Absolute: 0 10*3/uL (ref 0.0–0.1)
Basophils Relative: 1 %
Eosinophils Absolute: 0.1 10*3/uL (ref 0.0–0.5)
Eosinophils Relative: 1 %
HCT: 42 % (ref 36.0–46.0)
Hemoglobin: 13.3 g/dL (ref 12.0–15.0)
Immature Granulocytes: 0 %
Lymphocytes Relative: 25 %
Lymphs Abs: 1.6 10*3/uL (ref 0.7–4.0)
MCH: 30.2 pg (ref 26.0–34.0)
MCHC: 31.7 g/dL (ref 30.0–36.0)
MCV: 95.2 fL (ref 80.0–100.0)
Monocytes Absolute: 0.5 10*3/uL (ref 0.1–1.0)
Monocytes Relative: 8 %
Neutro Abs: 4.2 10*3/uL (ref 1.7–7.7)
Neutrophils Relative %: 65 %
Platelets: 221 10*3/uL (ref 150–400)
RBC: 4.41 MIL/uL (ref 3.87–5.11)
RDW: 13.3 % (ref 11.5–15.5)
WBC: 6.5 10*3/uL (ref 4.0–10.5)
nRBC: 0 % (ref 0.0–0.2)

## 2022-11-15 LAB — BASIC METABOLIC PANEL
Anion gap: 12 (ref 5–15)
BUN: 14 mg/dL (ref 8–23)
CO2: 22 mmol/L (ref 22–32)
Calcium: 9.1 mg/dL (ref 8.9–10.3)
Chloride: 104 mmol/L (ref 98–111)
Creatinine, Ser: 1.16 mg/dL — ABNORMAL HIGH (ref 0.44–1.00)
GFR, Estimated: 47 mL/min — ABNORMAL LOW (ref >60)
Glucose, Bld: 85 mg/dL (ref 70–99)
Potassium: 3.6 mmol/L (ref 3.5–5.1)
Sodium: 138 mmol/L (ref 135–145)

## 2022-11-15 LAB — TROPONIN I (HIGH SENSITIVITY): Troponin I (High Sensitivity): 9 ng/L (ref <18)

## 2022-11-15 MED ORDER — HYDRALAZINE HCL 20 MG/ML IJ SOLN
5.0000 mg | Freq: Once | INTRAMUSCULAR | Status: AC
  Administered 2022-11-15: 5 mg via INTRAVENOUS
  Filled 2022-11-15: qty 1

## 2022-11-15 NOTE — Discharge Instructions (Signed)
Please seek medical attention for any high fevers, chest pain, shortness of breath, change in behavior, persistent vomiting, bloody stool or any other new or concerning symptoms.  

## 2022-11-15 NOTE — ED Triage Notes (Signed)
Pt to ER via EMS from home.  States she was not feeling well and called her [facility] [place] [facility] who came and checked he BP and noted to be 200/103.  BP for EMS 202/111.  Denies CP, HA.

## 2022-11-15 NOTE — ED Provider Notes (Signed)
[facility] Provider Note    Event Date/Time   First MD Initiated Contact with Patient 11/15/22 1820     (approximate)   History   Hypertension   HPI  Jenna Powers is a 82 y.o. female who presents to the emergency department today because of concerns for elevated blood pressure and feeling unwell. The patient states that she had been feeling unwell all day. She has a hard time discribing what that means. When home health arrived they checked her blood pressure and noted it was elevated. Patient states she has history of high blood pressure in the past. She denies any chest pain. Denies any palpitations. Denies any headache.     Physical Exam   Triage Vital Signs: ED Triage Vitals  Enc Vitals Group     BP 11/15/22 1823 (!) 206/115     Pulse Rate 11/15/22 1823 86     Resp 11/15/22 1823 18     Temp 11/15/22 1823 98 F (36.7 C)     Temp Source 11/15/22 1823 Oral     SpO2 11/15/22 1823 98 %     Weight 11/15/22 1825 140 lb (63.5 kg)     Height 11/15/22 1825 5\' 6"  (1.676 m)     Head Circumference --      Peak Flow --      Pain Score 11/15/22 1825 0     Pain Loc --      Pain Edu? --      Excl. in [place]? --     Most recent vital signs: Vitals:   11/15/22 1823  BP: (!) 206/115  Pulse: 86  Resp: 18  Temp: 98 F (36.7 C)  SpO2: 98%   General: Awake, alert, oriented. CV:  Good peripheral perfusion. Regular rate and rhythm. Resp:  Normal effort. Lungs clear. Abd:  No distention.     ED Results / Procedures / Treatments   Labs (all labs ordered are listed, but only abnormal results are displayed) Labs Reviewed  BASIC METABOLIC PANEL - Abnormal; Notable for the following components:      Result Value   Creatinine, Ser 1.16 (*)    GFR, Estimated 47 (*)    All other components within normal limits  CBC WITH DIFFERENTIAL/PLATELET  TROPONIN I (HIGH SENSITIVITY)  TROPONIN I (HIGH SENSITIVITY)     EKG  I, Nance Pear, attending  physician, personally viewed and interpreted this EKG  EKG Time: 1825 Rate: 83 Rhythm: sinus rhythm Axis: rightward axis Intervals: qtc 522 QRS: RBBB ST changes: no st elevation Impression: abnormal ekg    RADIOLOGY None   PROCEDURES:  Critical Care performed: No    MEDICATIONS ORDERED IN ED: Medications - No data to display   IMPRESSION / MDM / [place] / ED COURSE  I reviewed the triage vital signs and the nursing notes.                              Differential diagnosis includes, but is not limited to, hypertension, heart disease, anemia, electrolyte abnormality  Patient's presentation is most consistent with acute presentation with potential threat to life or bodily function.   The patient is on the cardiac monitor to evaluate for evidence of arrhythmia and/or significant heart rate changes.  Patient presented to the emergency department today because of concerns for high blood pressure and feeling unwell.  Initial blood pressure here in the emergency department is  quite elevated.  EKG without any concerning ST elevation.  Blood work was checked without any concerning anemia or electrolyte abnormality.  Troponin was negative.  I would expect some elevation if this was ACS given that the patient's symptoms have been ongoing throughout the day.  Patient was given some hydralazine here which did help improve her blood pressure.  She did states she clinically felt better.  At this time I think it is reasonable for patient to be discharged home. Discussed with patient importance of following up with primary care.      FINAL CLINICAL IMPRESSION(S) / ED DIAGNOSES   Final diagnoses:  Hypertension, unspecified type     Note:  This document was prepared using Dragon voice recognition software and may include unintentional dictation errors.    Nance Pear, MD 11/15/22 2036

## 2023-01-09 ENCOUNTER — Emergency Department
Admission: EM | Admit: 2023-01-09 | Discharge: 2023-01-10 | Disposition: A | Discharge status: Home / Self Care | Payer: Medicare Other | Attending: Emergency Medicine | Admitting: Emergency Medicine

## 2023-01-09 ENCOUNTER — Emergency Department: Payer: Medicare Other

## 2023-01-09 ENCOUNTER — Other Ambulatory Visit: Payer: Self-pay

## 2023-01-09 DIAGNOSIS — D339 Benign neoplasm of central nervous system, unspecified: Secondary | ICD-10-CM | POA: Diagnosis not present

## 2023-01-09 DIAGNOSIS — F332 Major depressive disorder, recurrent severe without psychotic features: Secondary | ICD-10-CM | POA: Insufficient documentation

## 2023-01-09 DIAGNOSIS — G9389 Other specified disorders of brain: Secondary | ICD-10-CM | POA: Insufficient documentation

## 2023-01-09 DIAGNOSIS — R45851 Suicidal ideations: Secondary | ICD-10-CM | POA: Insufficient documentation

## 2023-01-09 DIAGNOSIS — Z8673 Personal history of transient ischemic attack (TIA), and cerebral infarction without residual deficits: Secondary | ICD-10-CM | POA: Insufficient documentation

## 2023-01-09 DIAGNOSIS — I129 Hypertensive chronic kidney disease with stage 1 through stage 4 chronic kidney disease, or unspecified chronic kidney disease: Secondary | ICD-10-CM | POA: Insufficient documentation

## 2023-01-09 DIAGNOSIS — F1190 Opioid use, unspecified, uncomplicated: Secondary | ICD-10-CM | POA: Insufficient documentation

## 2023-01-09 DIAGNOSIS — G4700 Insomnia, unspecified: Secondary | ICD-10-CM | POA: Diagnosis present

## 2023-01-09 DIAGNOSIS — F419 Anxiety disorder, unspecified: Secondary | ICD-10-CM | POA: Diagnosis not present

## 2023-01-09 DIAGNOSIS — R410 Disorientation, unspecified: Secondary | ICD-10-CM | POA: Insufficient documentation

## 2023-01-09 DIAGNOSIS — N189 Chronic kidney disease, unspecified: Secondary | ICD-10-CM | POA: Diagnosis not present

## 2023-01-09 LAB — URINALYSIS, ROUTINE W REFLEX MICROSCOPIC
Bacteria, UA: NONE SEEN
Bilirubin Urine: NEGATIVE
Glucose, UA: >=500 mg/dL — AB
Hgb urine dipstick: NEGATIVE
Ketones, ur: NEGATIVE mg/dL
Leukocytes,Ua: NEGATIVE
Nitrite: NEGATIVE
Protein, ur: NEGATIVE mg/dL
Specific Gravity, Urine: 1.012 (ref 1.005–1.030)
pH: 6 (ref 5.0–8.0)

## 2023-01-09 LAB — COMPREHENSIVE METABOLIC PANEL
ALT: 43 U/L (ref 0–44)
AST: 38 U/L (ref 15–41)
Albumin: 4.3 g/dL (ref 3.5–5.0)
Alkaline Phosphatase: 72 U/L (ref 38–126)
Anion gap: 12 (ref 5–15)
BUN: 27 mg/dL — ABNORMAL HIGH (ref 8–23)
CO2: 22 mmol/L (ref 22–32)
Calcium: 9.6 mg/dL (ref 8.9–10.3)
Chloride: 101 mmol/L (ref 98–111)
Creatinine, Ser: 1.17 mg/dL — ABNORMAL HIGH (ref 0.44–1.00)
GFR, Estimated: 46 mL/min — ABNORMAL LOW (ref >60)
Glucose, Bld: 93 mg/dL (ref 70–99)
Potassium: 4 mmol/L (ref 3.5–5.1)
Sodium: 135 mmol/L (ref 135–145)
Total Bilirubin: 0.7 mg/dL (ref 0.3–1.2)
Total Protein: 7.5 g/dL (ref 6.5–8.1)

## 2023-01-09 LAB — URINE DRUG SCREEN, QUALITATIVE (ARMC ONLY)
Amphetamines, Ur Screen: NOT DETECTED
Barbiturates, Ur Screen: NOT DETECTED
Benzodiazepine, Ur Scrn: NOT DETECTED
Cannabinoid 50 Ng, Ur ~~LOC~~: POSITIVE — AB
Cocaine Metabolite,Ur ~~LOC~~: NOT DETECTED
MDMA (Ecstasy)Ur Screen: NOT DETECTED
Methadone Scn, Ur: NOT DETECTED
Opiate, Ur Screen: POSITIVE — AB
Phencyclidine (PCP) Ur S: NOT DETECTED
Tricyclic, Ur Screen: NOT DETECTED

## 2023-01-09 LAB — TSH: TSH: 7.197 u[IU]/mL — ABNORMAL HIGH (ref 0.350–4.500)

## 2023-01-09 LAB — ETHANOL: Alcohol, Ethyl (B): <10 mg/dL (ref <10)

## 2023-01-09 LAB — CBC
HCT: 45.1 % (ref 36.0–46.0)
Hemoglobin: 14.8 g/dL (ref 12.0–15.0)
MCH: 30.2 pg (ref 26.0–34.0)
MCHC: 32.8 g/dL (ref 30.0–36.0)
MCV: 92 fL (ref 80.0–100.0)
Platelets: 294 10*3/uL (ref 150–400)
RBC: 4.9 MIL/uL (ref 3.87–5.11)
RDW: 13.7 % (ref 11.5–15.5)
WBC: 10 10*3/uL (ref 4.0–10.5)
nRBC: 0 % (ref 0.0–0.2)

## 2023-01-09 LAB — SALICYLATE LEVEL: Salicylate Lvl: <7 mg/dL — ABNORMAL LOW (ref 7.0–30.0)

## 2023-01-09 LAB — ACETAMINOPHEN LEVEL: Acetaminophen (Tylenol), Serum: <10 ug/mL — ABNORMAL LOW (ref 10–30)

## 2023-01-09 LAB — T4, FREE: Free T4: 1.08 ng/dL (ref 0.61–1.12)

## 2023-01-09 MED ORDER — LORAZEPAM 1 MG PO TABS
1.0000 mg | ORAL_TABLET | Freq: Once | ORAL | Status: AC
  Administered 2023-01-09: 1 mg via ORAL
  Filled 2023-01-09: qty 1

## 2023-01-09 MED ORDER — MIRTAZAPINE 15 MG PO TBDP
15.0000 mg | ORAL_TABLET | Freq: Every day | ORAL | Status: DC
  Administered 2023-01-09: 15 mg via ORAL
  Filled 2023-01-09: qty 1

## 2023-01-09 NOTE — ED Notes (Signed)
Pt to CT

## 2023-01-09 NOTE — Consult Note (Signed)
[facility] Face-to-Face Psychiatry Consult   Reason for Consult:  Anxiety and difficulty sleeping Referring Physician:  Pilar Jarvis, MD Patient Identification: Jenna Powers MRN:  [id number] Principal Diagnosis: <principal problem not specified> Diagnosis:  Active Problems:   Anxiety   Insomnia   Recurrent major depression-severe (HCC)   Total Time spent with patient: 1 hour  Subjective:   Jenna Powers is a 82 y.o. female patient admitted with "I can't sleep".  HPI:  Patient seen and chart reviewed. Jenna Powers, an 82 year-old female presented to the emergency department voluntarily due to persistent depression, difficulty sleeping, and anxiety.  She reports experiencing severe insomnia, stating she has not slept well for over a month.  During episodes of wakefulness at night, she feels her pulse "start beating like crazy."  She describes her mental state as not "crazy" but is very distressed by her symptoms.  She mentions a history of depression and anxiety and has been previously hospitalized for psychiatric reasons.  Currently, she is on opioids for rheumatoid arthritis. She also reports a significant recent weight loss of 20 pounds in the past 2 weeks.  She reports a history of multiple roles as a caregiver, having cared for her late husband, who passed away from prostate cancer, her mother, and dealing with the recent death of her siblings.  She has been married 4 times, with all husband is deceased.  Regarding her living situation, she mentions being in an assisted living facility last year due to a fall.  She states her son from [place] wishes her to move into an assisted living facility or to [place] with him, but she prefers to stay at her current residence.  She mentions that a nursing assistant visits her on Mondays, and she generally manages her meals, often having a accident which are frozen dinner.  The patient expresses having suicidal ideation but clarifies that she has a  plan but no intent to act on it, mainly due to the consideration of her sons.  She denies homicidal ideation, auditory or visual hallucinations, paranoia, or delusional thought content.  She states she feels overwhelmed by her current state, reminiscing about times when she was happier and more outgoing.  During evaluation, she is alert and oriented x 4, anxious but cooperative.  Speech is clear and coherent, answers questions appropriately.  She does not appear to be responding to internal/external stimuli, nor is there evidence of delusional thinking. Collateral information from the patient's son, Jenna Powers, who resides in [place], confirms her longstanding issues with sleep and mentions that she was previously in an assisted living facility but was declared competent to make her own decisions.  He does not believe she will harm herself and expresses a desire for her to live in an assisted living facility for better care.  Per Demetria Be., [place]: Clinical research associate spoke to [place] [phone]. [name] reports patient is no longer able to care for herself. She says she agrees with the PCP that patient needs to move to an ALF. "She needs round the clock care." She states patient's memory is declining and patient cannot remember if she has taken her meds in which she sometimes double doses, she can't remember if she has eaten which has caused patient to lose weight and patient does not remember when she sleeps. She states patient has been sleeping on and off. [name] mentioned that patient is constantly falling and could be seriously injured.   Past Psychiatric History: Charted history of anxiety and depression.  Risk to Self:   Risk to Others:   Prior Inpatient Therapy:   Prior Outpatient Therapy:    Past Medical History:  Past Medical History:  Diagnosis Date   Anxiety    Chronic kidney disease    Depression    Hyperlipemia    Hypertension    Hypothyroidism    Neuropathy    Stroke (HCC)    Thyroid disease     Tremor     Past Surgical History:  Procedure Laterality Date   ABDOMINAL HYSTERECTOMY     ANKLE SURGERY Right    Family History:  Family History  Problem Relation Age of Onset   Pneumonia Mother    Hypertension Father    Heart disease Father    Stroke Father    Family Psychiatric  History: None reported  Social History:  Social History   Substance and Sexual Activity  Alcohol Use Not Currently     Social History   Substance and Sexual Activity  Drug Use No    Social History   Socioeconomic History   Marital status: Widowed    Spouse name: Not on file   Number of children: Not on file   Years of education: Not on file   Highest education level: Not on file  Occupational History   Not on file  Tobacco Use   Smoking status: Former    Packs/day: .15    Types: Cigarettes    Quit date: 07/2019    Years since quitting: 3.4   Smokeless tobacco: Never  Vaping Use   Vaping Use: Never used  Substance and Sexual Activity   Alcohol use: Not Currently   Drug use: No   Sexual activity: Not on file  Other Topics Concern   Not on file  Social History Narrative   Lives alone at home.    Social Determinants of Health   Financial Resource Strain: Not on file  Food Insecurity: Not on file  Transportation Needs: Not on file  Physical Activity: Not on file  Stress: Not on file  Social Connections: Not on file   Additional Social History:    Allergies:   Allergies  Allergen Reactions   Codeine Other (See Comments)    Pt reports allergy but does not remember what it was    Labs:  Results for orders placed or performed during the hospital encounter of 01/09/23 (from the past 48 hour(s))  Comprehensive metabolic panel     Status: Abnormal   Collection Time: 01/09/23  8:54 AM  Result Value Ref Range   Sodium 135 135 - 145 mmol/L   Potassium 4.0 3.5 - 5.1 mmol/L   Chloride 101 98 - 111 mmol/L   CO2 22 22 - 32 mmol/L   Glucose, Bld 93 70 - 99 mg/dL    Comment:  Glucose reference range applies only to samples taken after fasting for at least 8 hours.   BUN 27 (H) 8 - 23 mg/dL   Creatinine, Ser 7.82 (H) 0.44 - 1.00 mg/dL   Calcium 9.6 8.9 - 95.6 mg/dL   Total Protein 7.5 6.5 - 8.1 g/dL   Albumin 4.3 3.5 - 5.0 g/dL   AST 38 15 - 41 U/L   ALT 43 0 - 44 U/L   Alkaline Phosphatase 72 38 - 126 U/L   Total Bilirubin 0.7 0.3 - 1.2 mg/dL   GFR, Estimated 46 (L) >60 mL/min    Comment: (NOTE) Calculated using the CKD-EPI Creatinine Equation (2021)    Anion  gap 12 5 - 15    Comment: Performed at [facility], [address] [place], [place] [address]  Ethanol     Status: None   Collection Time: 01/09/23  8:54 AM  Result Value Ref Range   Alcohol, Ethyl (B) <10 <10 mg/dL    Comment: (NOTE) Lowest detectable limit for serum alcohol is 10 mg/dL.  For medical purposes only. Performed at [facility], [address] [place], [place] [address]   Salicylate level     Status: Abnormal   Collection Time: 01/09/23  8:54 AM  Result Value Ref Range   Salicylate Lvl <7.0 (L) 7.0 - 30.0 mg/dL    Comment: Performed at [facility], [address] [place], [place] [address]  Acetaminophen level     Status: Abnormal   Collection Time: 01/09/23  8:54 AM  Result Value Ref Range   Acetaminophen (Tylenol), Serum <10 (L) 10 - 30 ug/mL    Comment: (NOTE) Therapeutic concentrations vary significantly. A range of 10-30 ug/mL  may be an effective concentration for many patients. However, some  are best treated at concentrations outside of this range. Acetaminophen concentrations >150 ug/mL at 4 hours after ingestion  and >50 ug/mL at 12 hours after ingestion are often associated with  toxic reactions.  Performed at [facility], [address] [place], [place] [address]   cbc     Status: None   Collection Time: 01/09/23  8:54 AM  Result Value Ref Range   WBC 10.0 4.0 - 10.5 K/uL   RBC 4.90 3.87 - 5.11 MIL/uL    Hemoglobin 14.8 12.0 - 15.0 g/dL   HCT 29.5 62.1 - 30.8 %   MCV 92.0 80.0 - 100.0 fL   MCH 30.2 26.0 - 34.0 pg   MCHC 32.8 30.0 - 36.0 g/dL   RDW 65.7 84.6 - 96.2 %   Platelets 294 150 - 400 K/uL   nRBC 0.0 0.0 - 0.2 %    Comment: Performed at [facility], [address] [place], [place] [address]  TSH     Status: Abnormal   Collection Time: 01/09/23  8:54 AM  Result Value Ref Range   TSH 7.197 (H) 0.350 - 4.500 uIU/mL    Comment: Performed by a 3rd Generation assay with a functional sensitivity of <=0.01 uIU/mL. Performed at [facility], [address] [place], [place] [address]   T4, free     Status: None   Collection Time: 01/09/23  8:54 AM  Result Value Ref Range   Free T4 1.08 0.61 - 1.12 ng/dL    Comment: (NOTE) Biotin ingestion may interfere with free T4 tests. If the results are inconsistent with the TSH level, previous test results, or the clinical presentation, then consider biotin interference. If needed, order repeat testing after stopping biotin. Performed at [facility], [address] [place], [place] [address]   Urine Drug Screen, Qualitative     Status: Abnormal   Collection Time: 01/09/23  1:30 PM  Result Value Ref Range   Tricyclic, Ur Screen NONE DETECTED NONE DETECTED   Amphetamines, Ur Screen NONE DETECTED NONE DETECTED   MDMA (Ecstasy)Ur Screen NONE DETECTED NONE DETECTED   Cocaine Metabolite,Ur [place] NONE DETECTED NONE DETECTED   Opiate, Ur Screen POSITIVE (A) NONE DETECTED   Phencyclidine (PCP) Ur S NONE DETECTED NONE DETECTED   Cannabinoid 50 Ng, Ur [place] POSITIVE (A) NONE DETECTED   Barbiturates, Ur Screen NONE DETECTED NONE DETECTED  Benzodiazepine, Ur Scrn NONE DETECTED NONE DETECTED   Methadone Scn, Ur NONE DETECTED NONE DETECTED    Comment: (NOTE) Tricyclics + metabolites, urine    Cutoff 1000 ng/mL Amphetamines + metabolites, urine  Cutoff 1000 ng/mL MDMA (Ecstasy), urine              Cutoff 500 ng/mL Cocaine  Metabolite, urine          Cutoff 300 ng/mL Opiate + metabolites, urine        Cutoff 300 ng/mL Phencyclidine (PCP), urine         Cutoff 25 ng/mL Cannabinoid, urine                 Cutoff 50 ng/mL Barbiturates + metabolites, urine  Cutoff 200 ng/mL Benzodiazepine, urine              Cutoff 200 ng/mL Methadone, urine                   Cutoff 300 ng/mL  The urine drug screen provides only a preliminary, unconfirmed analytical test result and should not be used for non-medical purposes. Clinical consideration and professional judgment should be applied to any positive drug screen result due to possible interfering substances. A more specific alternate chemical method must be used in order to obtain a confirmed analytical result. Gas chromatography / mass spectrometry (GC/MS) is the preferred confirm atory method. Performed at [facility], [address] [place], [place] [address]   Urinalysis, Routine w reflex microscopic -Urine, Clean Catch     Status: Abnormal   Collection Time: 01/09/23  1:30 PM  Result Value Ref Range   Color, Urine YELLOW (A) YELLOW   APPearance CLEAR (A) CLEAR   Specific Gravity, Urine 1.012 1.005 - 1.030   pH 6.0 5.0 - 8.0   Glucose, UA >=500 (A) NEGATIVE mg/dL   Hgb urine dipstick NEGATIVE NEGATIVE   Bilirubin Urine NEGATIVE NEGATIVE   Ketones, ur NEGATIVE NEGATIVE mg/dL   Protein, ur NEGATIVE NEGATIVE mg/dL   Nitrite NEGATIVE NEGATIVE   Leukocytes,Ua NEGATIVE NEGATIVE   RBC / HPF 0-5 0 - 5 RBC/hpf   WBC, UA 0-5 0 - 5 WBC/hpf   Bacteria, UA NONE SEEN NONE SEEN   Squamous Epithelial / HPF 0-5 0 - 5 /HPF   Mucus PRESENT     Comment: Performed at [facility], [address], [place], [place] [address]    Current Facility-Administered Medications  Medication Dose Route Frequency Provider Last Rate Last Admin   mirtazapine (REMERON SOL-TAB) disintegrating tablet 15 mg  15 mg Oral QHS Kaniesha Barile H, NP   15 mg at 01/09/23 2143    Current Outpatient Medications  Medication Sig Dispense Refill   acetaminophen (TYLENOL) 325 MG tablet Take 650 mg by mouth every 6 (six) hours as needed for moderate pain.     amLODipine (NORVASC) 2.5 MG tablet Take 2.5 mg by mouth daily.     atorvastatin (LIPITOR) 80 MG tablet Take 1 tablet (80 mg total) by mouth daily. 30 tablet 1   Cholecalciferol (VITAMIN D3 PO) Take 1,000 Units by mouth daily.     FLUoxetine (PROZAC) 20 MG capsule Take 20 mg by mouth daily.     furosemide (LASIX) 20 MG tablet Take 20 mg by mouth daily.     HYDROcodone-acetaminophen (NORCO/VICODIN) 5-325 MG tablet Take 1 tablet by mouth every 12 (twelve) hours.     levothyroxine (SYNTHROID) 25 MCG tablet Take 25 mcg by mouth every morning.  losartan (COZAAR) 100 MG tablet Take 1 tablet (100 mg total) by mouth daily. 30 tablet 1   memantine (NAMENDA) 10 MG tablet Take 10 mg by mouth 2 (two) times daily.     oxybutynin (DITROPAN-XL) 10 MG 24 hr tablet Take 10 mg by mouth daily.     QUEtiapine (SEROQUEL) 25 MG tablet Take 25 mg by mouth at bedtime.     amLODipine (NORVASC) 10 MG tablet Take 1 tablet (10 mg total) by mouth daily. (Patient not taking: Reported on 01/09/2023) 30 tablet 1   aspirin EC 81 MG EC tablet Take 1 tablet (81 mg total) by mouth daily. Swallow whole. (Patient not taking: Reported on 01/09/2023) 30 tablet 1   carvedilol (COREG) 12.5 MG tablet Take 1 tablet (12.5 mg total) by mouth 2 (two) times daily with a meal. (Patient not taking: Reported on 01/09/2023) 60 tablet 1   Multiple Vitamin (MULTIVITAMIN WITH MINERALS) TABS tablet Take 1 tablet by mouth daily. (Patient not taking: Reported on 01/09/2023)     sertraline (ZOLOFT) 100 MG tablet Take 100 mg by mouth daily. (Patient not taking: Reported on 01/09/2023)     Spacer/Aero-Holding Chambers (AEROCHAMBER PLUS) inhaler Use as instructed 1 each 2   traMADol (ULTRAM) 50 MG tablet Take 50 mg by mouth 3 (three) times daily as needed for moderate pain. (Patient  not taking: Reported on 01/09/2023)     traZODone (DESYREL) 100 MG tablet Take 200 mg by mouth at bedtime as needed for sleep. (Patient not taking: Reported on 01/09/2023)      Musculoskeletal: Strength & Muscle Tone: decreased Gait & Station:  Did not assess  Patient leans: N/A            Psychiatric Specialty Exam:  Presentation  General Appearance: No data recorded Eye Contact:No data recorded Speech:No data recorded Speech Volume:No data recorded Handedness:No data recorded  Mood and Affect  Mood:No data recorded Affect:No data recorded  Thought Process  Thought Processes:No data recorded Descriptions of Associations:No data recorded Orientation:No data recorded Thought Content:No data recorded History of Schizophrenia/Schizoaffective disorder:No  Duration of Psychotic Symptoms:No data recorded Hallucinations:No data recorded Ideas of Reference:No data recorded Suicidal Thoughts:No data recorded Homicidal Thoughts:No data recorded  Sensorium  Memory:No data recorded Judgment:No data recorded Insight:No data recorded  Executive Functions  Concentration:No data recorded Attention Span:No data recorded Recall:No data recorded Fund of Knowledge:No data recorded Language:No data recorded  Psychomotor Activity  Psychomotor Activity:No data recorded  Assets  Assets:No data recorded  Sleep  Sleep:No data recorded  Physical Exam: Physical Exam Vitals reviewed.  HENT:     Head: Normocephalic.     Nose: Nose normal.  Pulmonary:     Effort: Pulmonary effort is normal.  Musculoskeletal:        General: Normal range of motion.     Cervical back: Normal range of motion.  Neurological:     Mental Status: She is alert and oriented to person, place, and time.  Psychiatric:        Attention and Perception: Attention normal. She does not perceive auditory or visual hallucinations.        Mood and Affect: Mood is anxious. Affect is tearful.         Speech: Speech normal.        Behavior: Behavior is cooperative.        Thought Content: Thought content is not paranoid or delusional. Thought content includes suicidal (Endorsed SI with plan, without intent) ideation. Thought content does not include  homicidal ideation.        Cognition and Memory: Cognition and memory normal.        Judgment: Judgment is inappropriate.    Review of Systems  Psychiatric/Behavioral:  Positive for depression and suicidal ideas. The patient is nervous/anxious and has insomnia.    Blood pressure (!) 153/99, pulse 73, temperature 98.2 F (36.8 C), resp. rate 18, height 5\' 6"  (1.676 m), weight 64.4 kg, SpO2 95 %. Body mass index is 22.92 kg/m.  Treatment Plan Summary: Medication management and Plan : 82 year old female  presented to the emergency department with depression and a history of anxiety. Patient has been experiencing suicidal thoughts but states she would not act on them due to her sons.  She has a plan but no intent.  The benefits of mirtazapine for appetite stimulation, mood stabilization, and sleep support were discussed with the patient, who agreed to start the medication.  The patient will remain in the ED under observation overnight, start mirtazapine, and be reassessed in the morning.  Plan reviewed with Dr. Modesto Charon, EDP.  Disposition: Supportive therapy provided about ongoing stressors.  Norma Fredrickson, NP 01/10/2023 12:51 AM

## 2023-01-09 NOTE — ED Provider Notes (Signed)
[facility] Provider Note    Event Date/Time   First MD Initiated Contact with Patient 01/09/23 1029     (approximate)   History   Psychiatric Evaluation   HPI  Jenna Powers is a 82 y.o. female   Past medical history of thyroid, hypertension, hyperlipidemia, depression and anxiety, prior stroke, CKD who presents to the emergency department with anxiety and difficulty sleeping, reports that she ran out of sleeping pills about 1 week ago.  She an appointment with her primary doctor this week and was recommended to go to a nursing facility because she is unable to perform her activities of daily living, states that she feels like dying and would like to slit her wrists but will not do so because of her sons.  Chief complaint today is difficulty sleeping.  Denies drug or alcohol use.  She has been compliant with her other medications.  She denies any pain or recent illnesses, denies trauma.  [facility]  External Medical Documents Reviewed: Rheumatology note from last month reviewed her medication list, no NSAIDs due to CKD, continue on Norco.      Physical Exam   Triage Vital Signs: ED Triage Vitals [01/09/23 0851]  Enc Vitals Group     BP (!) 158/89     Pulse Rate 78     Resp 16     Temp 98.1 F (36.7 C)     Temp src      SpO2 93 %     Weight 142 lb (64.4 kg)     Height 5\' 6"  (1.676 m)     Head Circumference      Peak Flow      Pain Score 0     Pain Loc      Pain Edu?      Excl. in GC?     Most recent vital signs: Vitals:   01/09/23 0851  BP: (!) 158/89  Pulse: 78  Resp: 16  Temp: 98.1 F (36.7 C)  SpO2: 93%    General: Awake, no distress.  CV:  Good peripheral perfusion.  Resp:  Normal effort.  Abd:  No distention.  Other:  Awake alert comfortable hypertensive otherwise vital signs within normal limits.  Neck supple with full range of motion no obvious trauma to the head, moving all extremities abdomen  soft and nontender and her lungs are clear to auscultation.   ED Results / Procedures / Treatments   Labs (all labs ordered are listed, but only abnormal results are displayed) Labs Reviewed  COMPREHENSIVE METABOLIC PANEL - Abnormal; Notable for the following components:      Result Value   BUN 27 (*)    Creatinine, Ser 1.17 (*)    GFR, Estimated 46 (*)    All other components within normal limits  SALICYLATE LEVEL - Abnormal; Notable for the following components:   Salicylate Lvl <7.0 (*)    All other components within normal limits  ACETAMINOPHEN LEVEL - Abnormal; Notable for the following components:   Acetaminophen (Tylenol), Serum <10 (*)    All other components within normal limits  TSH - Abnormal; Notable for the following components:   TSH 7.197 (*)    All other components within normal limits  ETHANOL  CBC  T4, FREE  URINE DRUG SCREEN, QUALITATIVE ([facility] ONLY)  URINALYSIS, ROUTINE W REFLEX MICROSCOPIC     I ordered and reviewed the above labs they are notable for she has a  negative Tylenol and salicylate level.  Her white blood cell count and H&H are at baseline.  EKG  ED ECG REPORT I, Pilar Jarvis, the attending physician, personally viewed and interpreted this ECG.   Date: 01/09/2023  EKG Time: 1140  Rate: 78  Rhythm: sinus  Axis: nl  ST&T Change: no stemi    RADIOLOGY I independently reviewed and interpreted CT scan of the head and see no obvious bleeding or midline shift   PROCEDURES:  Critical Care performed: No  Procedures   MEDICATIONS ORDERED IN ED: Medications - No data to display    IMPRESSION / MDM / ASSESSMENT AND PLAN / ED COURSE  I reviewed the triage vital signs and the nursing notes.                                Patient's presentation is most consistent with acute presentation with potential threat to life or bodily function.  Differential diagnosis includes, but is not limited to, thyroid dysfunction, electrolyte  dysfunction, infection, intracranial bleeding, adverse effect of medication or withdrawal   The patient is on the cardiac monitor to evaluate for evidence of arrhythmia and/or significant heart rate changes.  MDM:    Chief complaint of anxiety and poor sleep in this patient with depression and anxiety, reportedly poor ADLs and needs nursing facility per her PMD which I see no note indicating the same.  Will obtain basic labs, thyroid labs, toxicologic labs, CT scan of the head, infection workup including urinalysis and if all appears normal then we will medically clear for psychiatric evaluation today.  Voluntary.         FINAL CLINICAL IMPRESSION(S) / ED DIAGNOSES   Final diagnoses:  Insomnia, unspecified type  Anxiety  Suicidal ideation     Rx / DC Orders   ED Discharge Orders     None        Note:  This document was prepared using Dragon voice recognition software and may include unintentional dictation errors.    Pilar Jarvis, MD 01/09/23 1146

## 2023-01-09 NOTE — BH Assessment (Addendum)
Comprehensive Clinical Assessment (CCA) Note  01/09/2023 [facility] [id number]  Jenna Powers, 82 year old female who presents to [facility] voluntarily for treatment. Per triage note, Pt to ED [facility] from home for anxiety, not sleeping. Ran out of sleeping pills a week ago. Took hydrocodone PTA, takes every 12 hours per EMS. Per EMS, pt had appt with PCP this week and was recommended nursing facility d/t not being able to perform ADLs. Pt states she feels like dying, states would slit her wrists but has 2 sons.  States she just wants somebody to figure out what's wrong with her and why she can't sleep.   During TTS assessment pt presents alert and oriented x 4, restless but cooperative, and mood-congruent with affect. The pt does not appear to be responding to internal or external stimuli. Neither is the pt presenting with any delusional thinking. Pt verified the information provided to triage RN.   Pt identifies her main complaint to be that she has not slept in a month. Patient reports being out of her sleep meds for about 2 weeks which has caused some anxiety. "My heart is racing." Patient presents nervous and uneasy. Patient states she is compliant with her medications and takes them as prescribed. Patient reports she lives alone and is her own POA but was recommended by her PCP, [facility], that she needed a higher level of care. Patient believes if she can sleep more consistently, she can continue her daily activities. Patient reports she has a nurse's aid, [facility] who comes to her home once a week. Patient reports having depressing thoughts but does not want to kill herself. " I don't like living like this. I need to get some sleep." Pt denies using any illicit substances and alcohol. Pt denies current SI/HI/AH/VH. Patient reports she does not want to be in the hospital and just wants to go home. Patient reports she has 2 sons but did not want writer to give them a call. Pt provided  [facility], Nurse Aid as a collateral contact.   Writer spoke to [name] 336 [phone]. [facility] reports patient is no longer able to care for herself. She says she agrees with the PCP that patient needs to move to an ALF. "She needs round the clock care." She states patient's memory is declining and patient cannot remember if she has taken her meds in which she sometimes double doses, she can't remember if she has eaten which has caused patient to lose weight and patient does not remember when she sleeps. She states patient has been sleeping on and off. [facility] mentioned that patient is constantly falling and could be seriously injured.    Dispo pending   Chief Complaint:  Chief Complaint  Patient presents with   Psychiatric Evaluation   Visit Diagnosis: Anxiety    CCA Screening, Triage and Referral (STR)  Patient Reported Information How did you hear about [place]? Self  Referral name: No data recorded Referral phone number: No data recorded  Whom do you see for routine medical problems? No data recorded Practice/Facility Name: No data recorded Practice/Facility Phone Number: No data recorded Name of Contact: No data recorded Contact Number: No data recorded Contact Fax Number: No data recorded Prescriber Name: No data recorded Prescriber Address (if known): No data recorded  What Is the Reason for Your Visit/Call Today? Patient reports she has not slept in 1 month.  How Long Has This Been Causing You Problems? 1-6 months  What Do You Feel  Would Help You the Most Today? Medication(s); Treatment for Depression or other mood problem   Have You Recently Been in Any Inpatient Treatment (Hospital/Detox/[facility]/28-Day Program)? No data recorded Name/Location of Program/Hospital:No data recorded How Long Were You There? No data recorded When Were You Discharged? No data recorded  Have You Ever Received Services From [facility] Before? No data recorded Who Do You See at [facility]? No  data recorded  Have You Recently Had Any Thoughts About Hurting Yourself? No  Are You Planning to Commit Suicide/Harm Yourself At This time? No   Have you Recently Had Thoughts About Hurting Someone [facility]? No  Explanation: No data recorded  Have You Used Any Alcohol or Drugs in the Past 24 Hours? No  How Long Ago Did You Use Drugs or Alcohol? No data recorded What Did You Use and How Much? No data recorded  Do You Currently Have a Therapist/Psychiatrist? No  Name of Therapist/Psychiatrist: No data recorded  Have You Been Recently Discharged From Any Office Practice or Programs? No  Explanation of Discharge From Practice/Program: No data recorded    CCA Screening Triage Referral Assessment Type of Contact: Face-to-Face  Is this Initial or Reassessment? No data recorded Date Telepsych consult ordered in [facility]:  No data recorded Time Telepsych consult ordered in [facility]:  No data recorded  Patient Reported Information Reviewed? No data recorded Patient Left Without Being Seen? No data recorded Reason for Not Completing Assessment: No data recorded  Collateral Involvement: [facility]- Nurse Aid [phone]   Does Patient Have a Court Appointed Legal Guardian? No data recorded Name and Contact of Legal Guardian: No data recorded If Minor and Not Living with Parent(s), Who has Custody? No data recorded Is CPS involved or ever been involved? Never  Is APS involved or ever been involved? Never   Patient Determined To Be At Risk for Harm To Self or Others Based on Review of Patient Reported Information or Presenting Complaint? No  Method: No Plan  Availability of Means: No access or NA  Intent: Vague intent or NA  Notification Required: No need or identified person  Additional Information for Danger to Others Potential: No data recorded Additional Comments for Danger to Others Potential: No data recorded Are There Guns or Other Weapons in Your Home? No  Types of  Guns/Weapons: No data recorded Are These Weapons Safely Secured?                            No data recorded Who Could Verify You Are Able To Have These Secured: No data recorded Do You Have any Outstanding Charges, Pending Court Dates, Parole/Probation? No data recorded Contacted To Inform of Risk of Harm To Self or Others: No data recorded  Location of Assessment: [facility]   Does Patient Present under Involuntary Commitment? No  IVC Papers Initial File Date: No data recorded  [place] of Residence: [place]   Patient Currently Receiving the Following Services: Medication Management   Determination of Need: Emergent (2 hours)   Options For Referral: ALF/SNF; ED Visit; Geropsychiatric Facility     CCA Biopsychosocial Intake/Chief Complaint:  No data recorded Current Symptoms/Problems: No data recorded  Patient Reported Schizophrenia/Schizoaffective Diagnosis in Past: No   Strengths: Patient is able to communicate and verbalize needs.  Preferences: No data recorded Abilities: No data recorded  Type of Services Patient Feels are Needed: No data recorded  Initial Clinical Notes/Concerns: No data recorded  Mental  Health Symptoms Depression:   Difficulty Concentrating; Fatigue; Sleep (too much or little); Increase/decrease in appetite   Duration of Depressive symptoms:  Greater than two weeks   Mania:   N/A   Anxiety:    Difficulty concentrating; Fatigue; Sleep; Worrying   Psychosis:   None   Duration of Psychotic symptoms: No data recorded  Trauma:   N/A   Obsessions:   N/A   Compulsions:   N/A   Inattention:   N/A   Hyperactivity/Impulsivity:   N/A   Oppositional/Defiant Behaviors:   N/A   Emotional Irregularity:   N/A   Other Mood/Personality Symptoms:  No data recorded   Mental Status Exam Appearance and self-care  Stature:   Average   Weight:   Average weight   Clothing:   Disheveled   Grooming:   Normal   Cosmetic use:    None   Posture/gait:   Tense; Rigid   Motor activity:   Restless   Sensorium  Attention:   Distractible; Confused   Concentration:   Anxiety interferes   Orientation:   X5   Recall/memory:   Defective in Recent   Affect and Mood  Affect:   Anxious; Depressed   Mood:   Anxious; Depressed   Relating  Eye contact:   Normal   Facial expression:   Anxious; Depressed; Tense   Attitude toward examiner:   Cooperative   Thought and Language  Speech flow:  Clear and Coherent; Pressured   Thought content:   Appropriate to Mood and Circumstances   Preoccupation:   None   Hallucinations:   None   Organization:  No data recorded  [employer] of Knowledge:   Average   Intelligence:   Average   Abstraction:   Functional   Judgement:   Impaired   Reality Testing:   Distorted   Insight:   Lacking   Decision Making:   Confused   Social Functioning  Social Maturity:   Impulsive   Social Judgement:   Heedless   Stress  Stressors:   Illness   Coping Ability:   Overwhelmed   Skill Deficits:   Activities of daily living; Decision making; Responsibility; Self-care   Supports:   Family     Religion:    Leisure/Recreation:    Exercise/Diet: Exercise/Diet Do You Have Any Trouble Sleeping?: Yes Explanation of Sleeping Difficulties: Patient reports she has not slept in 1 month.   CCA Employment/Education Employment/Work Situation:    Education:     CCA Family/Childhood History Family and Relationship History: Family history Does patient have children?: Yes How many children?: 2  Childhood History:     Child/Adolescent Assessment:     CCA Substance Use Alcohol/Drug Use: Alcohol / Drug Use Pain Medications: See PTA Prescriptions: See PTA Over the Counter: See PTA History of alcohol / drug use?: No history of alcohol / drug abuse                         ASAM's:  Six Dimensions of  Multidimensional Assessment  Dimension 1:  Acute Intoxication and/or Withdrawal Potential:      Dimension 2:  Biomedical Conditions and Complications:      Dimension 3:  Emotional, Behavioral, or Cognitive Conditions and Complications:     Dimension 4:  Readiness to Change:     Dimension 5:  Relapse, Continued use, or Continued Problem Potential:     Dimension 6:  Recovery/Living Environment:  ASAM Severity Score:    ASAM Recommended Level of Treatment:     Substance use Disorder (SUD)    Recommendations for Services/Supports/Treatments:    DSM5 Diagnoses: Patient Active Problem List   Diagnosis Date Noted   Acute delirium 06/21/2021   TIA (transient ischemic attack)    Weakness of right upper extremity 06/15/2021   Cerebral infarction (HCC) 06/15/2021   Right hand weakness 06/14/2021   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) 03/13/2021   Rhabdomyolysis 03/13/2021   Fall at home, initial encounter 03/13/2021   Leukocytosis 03/13/2021   Elevated troponin 03/13/2021   Intraparenchymal hemorrhage of brain (HCC) 03/13/2021   Acute right hemiparesis (HCC) 03/13/2021   Syncope 09/10/2019   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

## 2023-01-09 NOTE — ED Notes (Signed)
Sts she was living at an assisted living facility she did not like and called her son to come get her. Has not been sleeping well for the last month or so. She says when she goes without sleep she is unable to care for herself. Says she is not looking forward to placement but may need some help caring for herself.   Very pleasant and enjoys talking with staff.   Discussed plan of care. Pt verbalizes getting some sleep today and feeling better afterwards.

## 2023-01-09 NOTE — ED Notes (Signed)
Patient assisted to restroom. Unsteady and shuffles during ambulation

## 2023-01-09 NOTE — ED Notes (Signed)
Pt states is hungry because has not been able to prepare her food at home.

## 2023-01-09 NOTE — ED Notes (Signed)
Pt dressed out with this RN and Kara Mead NT. Belongings include: White t shirt Red shoes Snake skin purse Purple and black pants   Pt has depends on

## 2023-01-09 NOTE — ED Notes (Addendum)
NP at bedside. Patient taken to private room to speak with NP

## 2023-01-09 NOTE — ED Triage Notes (Signed)
Pt to ED [facility] from home for anxiety, not sleeping. Ran out of sleeping pills a week ago. Took hydrocodone PTA, takes every 12 hours per EMS.  Per EMS, pt had appt with PCP this week and was recommended nursing facility d/t not being able to perform ADLs.  Pt states she feels like dying, states would slit her wrists but has 2 sons.  States she just wants somebody to figure out what's wrong with her and why she can't sleep.

## 2023-01-10 DIAGNOSIS — D339 Benign neoplasm of central nervous system, unspecified: Secondary | ICD-10-CM

## 2023-01-10 DIAGNOSIS — R45851 Suicidal ideations: Secondary | ICD-10-CM

## 2023-01-10 DIAGNOSIS — G4700 Insomnia, unspecified: Secondary | ICD-10-CM | POA: Insufficient documentation

## 2023-01-10 DIAGNOSIS — F419 Anxiety disorder, unspecified: Secondary | ICD-10-CM

## 2023-01-10 DIAGNOSIS — F332 Major depressive disorder, recurrent severe without psychotic features: Secondary | ICD-10-CM | POA: Insufficient documentation

## 2023-01-10 MED ORDER — QUETIAPINE FUMARATE 25 MG PO TABS: 25.0000 mg | ORAL_TABLET | Freq: Every day | ORAL | Status: DC

## 2023-01-10 MED ORDER — LEVOTHYROXINE SODIUM 50 MCG PO TABS: 25.0000 ug | ORAL_TABLET | Freq: Every morning | ORAL | Status: DC

## 2023-01-10 MED ORDER — LORAZEPAM 1 MG PO TABS
1.0000 mg | ORAL_TABLET | Freq: Once | ORAL | Status: AC
  Administered 2023-01-10: 1 mg via ORAL
  Filled 2023-01-10: qty 1

## 2023-01-10 MED ORDER — ATORVASTATIN CALCIUM 20 MG PO TABS
80.0000 mg | ORAL_TABLET | Freq: Every day | ORAL | Status: DC
  Administered 2023-01-10: 80 mg via ORAL
  Filled 2023-01-10: qty 4

## 2023-01-10 MED ORDER — LOSARTAN POTASSIUM 50 MG PO TABS
100.0000 mg | ORAL_TABLET | Freq: Every day | ORAL | Status: DC
  Administered 2023-01-10: 100 mg via ORAL
  Filled 2023-01-10: qty 2

## 2023-01-10 MED ORDER — LEVOTHYROXINE SODIUM 50 MCG PO TABS: 25.0000 ug | ORAL_TABLET | Freq: Every day | ORAL | Status: DC

## 2023-01-10 MED ORDER — FLUOXETINE HCL 20 MG PO CAPS
20.0000 mg | ORAL_CAPSULE | Freq: Every day | ORAL | Status: DC
  Administered 2023-01-10: 20 mg via ORAL
  Filled 2023-01-10: qty 1

## 2023-01-10 MED ORDER — AMLODIPINE BESYLATE 5 MG PO TABS
2.5000 mg | ORAL_TABLET | Freq: Every day | ORAL | Status: DC
  Administered 2023-01-10: 2.5 mg via ORAL
  Filled 2023-01-10: qty 1

## 2023-01-10 MED ORDER — FUROSEMIDE 40 MG PO TABS
20.0000 mg | ORAL_TABLET | Freq: Every day | ORAL | Status: DC
  Administered 2023-01-10: 20 mg via ORAL
  Filled 2023-01-10: qty 1

## 2023-01-10 NOTE — Consult Note (Addendum)
[facility] Face-to-Face Psychiatry Consult   Reason for Consult:  Anxiety and difficulty sleeping Referring Physician:  Pilar Jarvis, MD Patient Identification: Jenna Powers MRN:  [id number] Principal Diagnosis: <principal problem not specified> Diagnosis:  Active Problems:   Anxiety   Insomnia   Recurrent major depression-severe (HCC)   Total Time spent with patient: 30 minutes  Subjective:   Jenna Powers is a 82 y.o. female patient admitted with "I'm fine".  05/23: Patient seen face-to-face. The case was discussed with Dr. Fanny Bien, EDP. On assessment today, she expresses a desire to leave the hospital due to another patient "yelling and screaming." She denies experiencing suicidal or homicidal ideation, auditory or visual hallucinations, paranoia or delusional thinking. She notes an improvement in her appetite since the medication adjustment and states that she has not experienced any side or adverse effects from the new medication regimen. She is alert and oriented x 4.  Although she appears anxious, she is cooperative during the evaluation. Speech is clear and coherent, answers questions appropriately.  She does not appear to be responding to internal/external stimuli, nor is there evidence of paranoia or delusional thinking.  Concerns from her son, a caregiver, and her primary care provider regarding living alone have led to [facility] consult to ensure a safe discharge plan is in place.    HPI:  Patient seen and chart reviewed. Jenna Powers, an 82 year-old female presented to the emergency department voluntarily due to persistent depression, difficulty sleeping, and anxiety.  She reports experiencing severe insomnia, stating she has not slept well for over a month.  During episodes of wakefulness at night, she feels her pulse "start beating like crazy."  She describes her mental state as not "crazy" but is very distressed by her symptoms.  She mentions a history of depression and anxiety and  has been previously hospitalized for psychiatric reasons.  Currently, she is on opioids for rheumatoid arthritis. She also reports a significant recent weight loss of 20 pounds in the past 2 weeks.  She reports a history of multiple roles as a caregiver, having cared for her late husband, who passed away from prostate cancer, her mother, and dealing with the recent death of her siblings.  She has been married 4 times, with all husband is deceased.  Regarding her living situation, she mentions being in an assisted living facility last year due to a fall.  She states her son from [place] wishes her to move into an assisted living facility or to [place] with him, but she prefers to stay at her current residence.  She mentions that a nursing assistant visits her on Mondays, and she generally manages her meals, often having a accident which are frozen dinner.  The patient expresses having suicidal ideation but clarifies that she has a plan but no intent to act on it, mainly due to the consideration of her sons.  She denies homicidal ideation, auditory or visual hallucinations, paranoia, or delusional thought content.  She states she feels overwhelmed by her current state, reminiscing about times when she was happier and more outgoing.  During evaluation, she is alert and oriented x 4, anxious but cooperative.  Speech is clear and coherent, answers questions appropriately.  She does not appear to be responding to internal/external stimuli, nor is there evidence of delusional thinking. Collateral information from the patient's son, Jenna Powers, who resides in [place], confirms her longstanding issues with sleep and mentions that she was previously in an assisted living facility but was declared  competent to make her own decisions.  He does not believe she will harm herself and expresses a desire for her to live in an assisted living facility for better care.  Per Demetria Be., [place]: Clinical research associate spoke to [place] 336 [phone].  [name] reports patient is no longer able to care for herself. She says she agrees with the PCP that patient needs to move to an ALF. "She needs round the clock care." She states patient's memory is declining and patient cannot remember if she has taken her meds in which she sometimes double doses, she can't remember if she has eaten which has caused patient to lose weight and patient does not remember when she sleeps. She states patient has been sleeping on and off. [name] mentioned that patient is constantly falling and could be seriously injured.   Past Psychiatric History: Charted history of anxiety and depression.  Risk to Self:   Risk to Others:   Prior Inpatient Therapy:   Prior Outpatient Therapy:    Past Medical History:  Past Medical History:  Diagnosis Date   Anxiety    Chronic kidney disease    Depression    Hyperlipemia    Hypertension    Hypothyroidism    Neuropathy    Stroke (HCC)    Thyroid disease    Tremor     Past Surgical History:  Procedure Laterality Date   ABDOMINAL HYSTERECTOMY     ANKLE SURGERY Right    Family History:  Family History  Problem Relation Age of Onset   Pneumonia Mother    Hypertension Father    Heart disease Father    Stroke Father    Family Psychiatric  History: None reported  Social History:  Social History   Substance and Sexual Activity  Alcohol Use Not Currently     Social History   Substance and Sexual Activity  Drug Use No    Social History   Socioeconomic History   Marital status: Widowed    Spouse name: Not on file   Number of children: Not on file   Years of education: Not on file   Highest education level: Not on file  Occupational History   Not on file  Tobacco Use   Smoking status: Former    Packs/day: .15    Types: Cigarettes    Quit date: 07/2019    Years since quitting: 3.4   Smokeless tobacco: Never  Vaping Use   Vaping Use: Never used  Substance and Sexual Activity   Alcohol use: Not Currently    Drug use: No   Sexual activity: Not on file  Other Topics Concern   Not on file  Social History Narrative   Lives alone at home.    Social Determinants of Health   Financial Resource Strain: Not on file  Food Insecurity: Not on file  Transportation Needs: Not on file  Physical Activity: Not on file  Stress: Not on file  Social Connections: Not on file   Additional Social History:    Allergies:   Allergies  Allergen Reactions   Codeine Other (See Comments)    Pt reports allergy but does not remember what it was    Labs:  Results for orders placed or performed during the hospital encounter of 01/09/23 (from the past 48 hour(s))  Comprehensive metabolic panel     Status: Abnormal   Collection Time: 01/09/23  8:54 AM  Result Value Ref Range   Sodium 135 135 - 145 mmol/L   Potassium  4.0 3.5 - 5.1 mmol/L   Chloride 101 98 - 111 mmol/L   CO2 22 22 - 32 mmol/L   Glucose, Bld 93 70 - 99 mg/dL    Comment: Glucose reference range applies only to samples taken after fasting for at least 8 hours.   BUN 27 (H) 8 - 23 mg/dL   Creatinine, Ser 4.54 (H) 0.44 - 1.00 mg/dL   Calcium 9.6 8.9 - 09.8 mg/dL   Total Protein 7.5 6.5 - 8.1 g/dL   Albumin 4.3 3.5 - 5.0 g/dL   AST 38 15 - 41 U/L   ALT 43 0 - 44 U/L   Alkaline Phosphatase 72 38 - 126 U/L   Total Bilirubin 0.7 0.3 - 1.2 mg/dL   GFR, Estimated 46 (L) >60 mL/min    Comment: (NOTE) Calculated using the CKD-EPI Creatinine Equation (2021)    Anion gap 12 5 - 15    Comment: Performed at [facility], [address] [place], [place] [address]  Ethanol     Status: None   Collection Time: 01/09/23  8:54 AM  Result Value Ref Range   Alcohol, Ethyl (B) <10 <10 mg/dL    Comment: (NOTE) Lowest detectable limit for serum alcohol is 10 mg/dL.  For medical purposes only. Performed at [facility], [address] [place], [place] [address]   Salicylate level     Status: Abnormal   Collection Time: 01/09/23   8:54 AM  Result Value Ref Range   Salicylate Lvl <7.0 (L) 7.0 - 30.0 mg/dL    Comment: Performed at [facility], [address] [place], [place] [address]  Acetaminophen level     Status: Abnormal   Collection Time: 01/09/23  8:54 AM  Result Value Ref Range   Acetaminophen (Tylenol), Serum <10 (L) 10 - 30 ug/mL    Comment: (NOTE) Therapeutic concentrations vary significantly. A range of 10-30 ug/mL  may be an effective concentration for many patients. However, some  are best treated at concentrations outside of this range. Acetaminophen concentrations >150 ug/mL at 4 hours after ingestion  and >50 ug/mL at 12 hours after ingestion are often associated with  toxic reactions.  Performed at [facility], [address] [place], [place] [address]   cbc     Status: None   Collection Time: 01/09/23  8:54 AM  Result Value Ref Range   WBC 10.0 4.0 - 10.5 K/uL   RBC 4.90 3.87 - 5.11 MIL/uL   Hemoglobin 14.8 12.0 - 15.0 g/dL   HCT 46.9 62.9 - 52.8 %   MCV 92.0 80.0 - 100.0 fL   MCH 30.2 26.0 - 34.0 pg   MCHC 32.8 30.0 - 36.0 g/dL   RDW 41.3 24.4 - 01.0 %   Platelets 294 150 - 400 K/uL   nRBC 0.0 0.0 - 0.2 %    Comment: Performed at [facility], [address] [place], [place] [address]  TSH     Status: Abnormal   Collection Time: 01/09/23  8:54 AM  Result Value Ref Range   TSH 7.197 (H) 0.350 - 4.500 uIU/mL    Comment: Performed by a 3rd Generation assay with a functional sensitivity of <=0.01 uIU/mL. Performed at [facility], [address] [place], [place] [address]   T4, free     Status: None   Collection Time: 01/09/23  8:54 AM  Result Value Ref Range   Free T4 1.08 0.61 - 1.12 ng/dL  Comment: (NOTE) Biotin ingestion may interfere with free T4 tests. If the results are inconsistent with the TSH level, previous test results, or the clinical presentation, then consider biotin interference. If needed, order repeat testing after  stopping biotin. Performed at [facility], [address] [place], [place] [address]   Urine Drug Screen, Qualitative     Status: Abnormal   Collection Time: 01/09/23  1:30 PM  Result Value Ref Range   Tricyclic, Ur Screen NONE DETECTED NONE DETECTED   Amphetamines, Ur Screen NONE DETECTED NONE DETECTED   MDMA (Ecstasy)Ur Screen NONE DETECTED NONE DETECTED   Cocaine Metabolite,Ur [place] NONE DETECTED NONE DETECTED   Opiate, Ur Screen POSITIVE (A) NONE DETECTED   Phencyclidine (PCP) Ur S NONE DETECTED NONE DETECTED   Cannabinoid 50 Ng, Ur [place] POSITIVE (A) NONE DETECTED   Barbiturates, Ur Screen NONE DETECTED NONE DETECTED   Benzodiazepine, Ur Scrn NONE DETECTED NONE DETECTED   Methadone Scn, Ur NONE DETECTED NONE DETECTED    Comment: (NOTE) Tricyclics + metabolites, urine    Cutoff 1000 ng/mL Amphetamines + metabolites, urine  Cutoff 1000 ng/mL MDMA (Ecstasy), urine              Cutoff 500 ng/mL Cocaine Metabolite, urine          Cutoff 300 ng/mL Opiate + metabolites, urine        Cutoff 300 ng/mL Phencyclidine (PCP), urine         Cutoff 25 ng/mL Cannabinoid, urine                 Cutoff 50 ng/mL Barbiturates + metabolites, urine  Cutoff 200 ng/mL Benzodiazepine, urine              Cutoff 200 ng/mL Methadone, urine                   Cutoff 300 ng/mL  The urine drug screen provides only a preliminary, unconfirmed analytical test result and should not be used for non-medical purposes. Clinical consideration and professional judgment should be applied to any positive drug screen result due to possible interfering substances. A more specific alternate chemical method must be used in order to obtain a confirmed analytical result. Gas chromatography / mass spectrometry (GC/MS) is the preferred confirm atory method. Performed at [facility], [address] [place], [place] [address]   Urinalysis, Routine w reflex microscopic -Urine, Clean Catch     Status: Abnormal    Collection Time: 01/09/23  1:30 PM  Result Value Ref Range   Color, Urine YELLOW (A) YELLOW   APPearance CLEAR (A) CLEAR   Specific Gravity, Urine 1.012 1.005 - 1.030   pH 6.0 5.0 - 8.0   Glucose, UA >=500 (A) NEGATIVE mg/dL   Hgb urine dipstick NEGATIVE NEGATIVE   Bilirubin Urine NEGATIVE NEGATIVE   Ketones, ur NEGATIVE NEGATIVE mg/dL   Protein, ur NEGATIVE NEGATIVE mg/dL   Nitrite NEGATIVE NEGATIVE   Leukocytes,Ua NEGATIVE NEGATIVE   RBC / HPF 0-5 0 - 5 RBC/hpf   WBC, UA 0-5 0 - 5 WBC/hpf   Bacteria, UA NONE SEEN NONE SEEN   Squamous Epithelial / HPF 0-5 0 - 5 /HPF   Mucus PRESENT     Comment: Performed at [facility], [address] [place], [place] [address]    Current Facility-Administered Medications  Medication Dose Route Frequency Provider Last Rate Last Admin   amLODipine (NORVASC) tablet 2.5 mg  2.5 mg Oral Daily [name], MD  2.5 mg at 01/10/23 1020   atorvastatin (LIPITOR) tablet 80 mg  80 mg Oral Daily [name], MD   80 mg at 01/10/23 1020   FLUoxetine (PROZAC) capsule 20 mg  20 mg Oral Daily [name], MD   20 mg at 01/10/23 1019   furosemide (LASIX) tablet 20 mg  20 mg Oral Daily [name], MD   20 mg at 01/10/23 1019   [START ON 01/11/2023] levothyroxine (SYNTHROID) tablet 25 mcg  25 mcg Oral Q0600 Drusilla Kanner, RPH       losartan (COZAAR) tablet 100 mg  100 mg Oral Daily [name], MD   100 mg at 01/10/23 1020   mirtazapine (REMERON SOL-TAB) disintegrating tablet 15 mg  15 mg Oral QHS Sammantha Mehlhaff H, NP   15 mg at 01/09/23 2143   QUEtiapine (SEROQUEL) tablet 25 mg  25 mg Oral QHS [name], MD       Current Outpatient Medications  Medication Sig Dispense Refill   acetaminophen (TYLENOL) 325 MG tablet Take 650 mg by mouth every 6 (six) hours as needed for moderate pain.     amLODipine (NORVASC) 2.5 MG tablet Take 2.5 mg by mouth daily.     atorvastatin (LIPITOR) 80 MG tablet Take 1 tablet (80 mg total) by mouth daily. 30 tablet  1   Cholecalciferol (VITAMIN D3 PO) Take 1,000 Units by mouth daily.     FLUoxetine (PROZAC) 20 MG capsule Take 20 mg by mouth daily.     furosemide (LASIX) 20 MG tablet Take 20 mg by mouth daily.     HYDROcodone-acetaminophen (NORCO/VICODIN) 5-325 MG tablet Take 1 tablet by mouth every 12 (twelve) hours.     levothyroxine (SYNTHROID) 25 MCG tablet Take 25 mcg by mouth every morning.     losartan (COZAAR) 100 MG tablet Take 1 tablet (100 mg total) by mouth daily. 30 tablet 1   memantine (NAMENDA) 10 MG tablet Take 10 mg by mouth 2 (two) times daily.     oxybutynin (DITROPAN-XL) 10 MG 24 hr tablet Take 10 mg by mouth daily.     QUEtiapine (SEROQUEL) 25 MG tablet Take 25 mg by mouth at bedtime.     amLODipine (NORVASC) 10 MG tablet Take 1 tablet (10 mg total) by mouth daily. (Patient not taking: Reported on 01/09/2023) 30 tablet 1   aspirin EC 81 MG EC tablet Take 1 tablet (81 mg total) by mouth daily. Swallow whole. (Patient not taking: Reported on 01/09/2023) 30 tablet 1   carvedilol (COREG) 12.5 MG tablet Take 1 tablet (12.5 mg total) by mouth 2 (two) times daily with a meal. (Patient not taking: Reported on 01/09/2023) 60 tablet 1   Multiple Vitamin (MULTIVITAMIN WITH MINERALS) TABS tablet Take 1 tablet by mouth daily. (Patient not taking: Reported on 01/09/2023)     sertraline (ZOLOFT) 100 MG tablet Take 100 mg by mouth daily. (Patient not taking: Reported on 01/09/2023)     Spacer/Aero-Holding Chambers (AEROCHAMBER PLUS) inhaler Use as instructed 1 each 2   traMADol (ULTRAM) 50 MG tablet Take 50 mg by mouth 3 (three) times daily as needed for moderate pain. (Patient not taking: Reported on 01/09/2023)     traZODone (DESYREL) 100 MG tablet Take 200 mg by mouth at bedtime as needed for sleep. (Patient not taking: Reported on 01/09/2023)      Musculoskeletal: Strength & Muscle Tone: decreased Gait & Station:  Did not assess  Patient leans: N/A  Psychiatric Specialty  Exam:  Presentation  General Appearance: Appropriate for Environment  Eye Contact:Good  Speech:Clear and Coherent  Speech Volume:Normal  Handedness:Right   Mood and Affect  Mood:Anxious  Affect:Congruent   Thought Process  Thought Processes:Coherent  Descriptions of Associations:Intact  Orientation:Full (Time, Place and Person)  Thought Content:Logical  History of Schizophrenia/Schizoaffective disorder:No  Duration of Psychotic Symptoms:No data recorded Hallucinations:Hallucinations: None  Ideas of Reference:None  Suicidal Thoughts:Suicidal Thoughts: No  Homicidal Thoughts:Homicidal Thoughts: No   Sensorium  Memory:Immediate Good; Recent Fair  Judgment:Fair  Insight:Fair   Executive Functions  Concentration:Good  Attention Span:Good  Recall:Fair  Fund of Knowledge:Good  Language:Good   Psychomotor Activity  Psychomotor Activity:Psychomotor Activity: Decreased; Shuffling Gait   Assets  Assets:Communication Skills; Resilience; Social Support   Sleep  Sleep:Sleep: Fair   Physical Exam: Physical Exam Vitals reviewed.  HENT:     Head: Normocephalic.     Nose: Nose normal.  Pulmonary:     Effort: Pulmonary effort is normal.  Musculoskeletal:        General: Normal range of motion.     Cervical back: Normal range of motion.  Neurological:     Mental Status: She is alert and oriented to person, place, and time.  Psychiatric:        Attention and Perception: Attention normal. She does not perceive auditory or visual hallucinations.        Mood and Affect: Mood is anxious.        Speech: Speech normal.        Behavior: Behavior is cooperative.        Thought Content: Thought content is not paranoid or delusional. Thought content does not include homicidal or suicidal ideation.        Cognition and Memory: Cognition and memory normal.        Judgment: Judgment normal.    ROS Blood pressure (!) 140/83, pulse 97, temperature 98.2 F  (36.8 C), temperature source Oral, resp. rate 14, height 5\' 6"  (1.676 m), weight 64.4 kg, SpO2 95 %. Body mass index is 22.92 kg/m.  Treatment Plan Summary: Medication management and Plan : 82 year old female  presented to the emergency department with depression and suicidal thoughts without intent.  The patient was started on a low-dose of mirtazapine for appetite stimulation, mood stabilization, and sleep support.  She denies side or adverse effects from the medication adjustment and states she is ready for discharge.  Due to concerns expressed by the patient's son, caregiver, and primary care provider, a transition of care (TOC) consult has been placed to ensure a safe discharge. Mini Mental State (MMSE) score 28/30. Plan reviewed with Dr. Fanny Bien, EDP.  Disposition: Supportive therapy provided about ongoing stressors.  Norma Fredrickson, NP 01/10/2023 1:19 PM

## 2023-01-10 NOTE — ED Notes (Addendum)
Pt was given a walker and is able to ambulate around her room. Pt came to the door of her room about 10 mins ago and told this RN that "this is inhumane treatment. I have rights and shouldn't be here. This is prison." Pt told this RN that she wants to call her son to have him come pick her up, then stated she wanted to call her nurse to have her pick her up, and then wanted to call her PCP to have them discharge her. Pt's agitation is getting high "because of the crazy person down the hall." MD made aware and PO ativan has been ordered.

## 2023-01-10 NOTE — ED Provider Notes (Signed)
Patient would like to engage with home health services.  TOC team is recommended that the patient have referral for evaluation for possible aide, PT OT and RN services.  The patient does use a walker, and has some gait and balance difficulties.  I think also given her age and deconditioning these would be beneficial services for her.   [name], MD 01/10/23 1524

## 2023-01-10 NOTE — ED Notes (Signed)
This RN spoke with LCSW after they left the pt's room about the plan of care and was informed that after speaking with the pt and the ED provider they feel comfortable d/cing the pt home and that the pt feels comfortable going home in a taxi. This RN verified that the pt is indeed headed home and was told that that info was correct.

## 2023-01-10 NOTE — ED Provider Notes (Signed)
Psychiatry team, Jenna Powers, is advised that the patient does not need inpatient psychiatric services.  However family, PCP, and her caretaker advised that she may need additional increased level of care such as assisted living or facility placement due to difficulty with ADLs.  They have recommended a TOC consult.  I have reordered her home medications, psychiatry advises a plan to continue her also on mirtazapine, and have requested a TOC consult   Jenna Creamer, MD 01/10/23 0945

## 2023-01-10 NOTE — ED Notes (Signed)
NO call light present in room. Bed alarm signal alarms. Pt steady on feet. Sitting on chair asking why she is still here. Says she knows she is confused and this feels like a bad dream. Comforting measures implemented. Escorted back to bed and talked to for a bit. Warm blankets provided to promote a resting environment.

## 2023-01-10 NOTE — ED Provider Notes (Signed)
Patient has advised me that she wishes to discharge to home.  At this point she is alert she is oriented, she has been seen by psychiatry and does not meet criteria to be admitted and is not under IVC.    She advised that her son is coming up from [place] and her primary care has already been engaged in her care.  She is amenable to discussion with our transition of care team about potential in-home resources but is adamant that she wishes to be discharged and would not entertain living elsewhere other than in her own home at this point.  She reports after resting last night she feels much better she does not feel suicidal any longer but does reports she feels she has depression.    At this juncture, patient appears appropriate for discharge with anticipated close outpatient follow-up which I have encouraged her to set up and [facility] team currently meeting with her to provide her resources and potential in-home service to have opportunities if available to her  Return precautions and treatment recommendations and follow-up discussed with the patient who is agreeable with the plan.    [name], MD 01/10/23 [phone]

## 2023-01-10 NOTE — TOC Transition Note (Addendum)
Transition of Care [facility]) - CM/SW Discharge Note   Patient Details  Name: Jenna Powers MRN: [id number] Date of Birth: [birth year]  Transition of Care [facility]) CM/SW Contact:  Darolyn Rua, LCSW Phone Number: 01/10/2023, 3:39 PM   Clinical Narrative:     CSW met with patient at bedside, patient is not under IVC and wishes to go home. Patient has no legal guardian and has capacity to make her own decisions, A&O X4.   Patient was psych and medically cleared. Patient confirmed having an aid [name] she private pays for once a week.   Patient agreeable to home health PT OT RN aide and social work covered by her insurance for a short time to ensure she is supported at home. Patient reports her son lives in [place].   Patient utilizing walker in room, ambulating well. Reports having walker at home and rails on her steps into the home she uses if needed. Confirmed feeling comfortable and safe with taxi ride home. Reports her clothes and house keys are in her bag here.  No further discharge plan needs, patient is set up with [facility].   Final next level of care: Home w Home Health Services Barriers to Discharge: No Barriers Identified   Patient Goals and CMS Choice CMS Medicare.gov Compare Post Acute Care list provided to:: Patient Choice offered to / list presented to : Patient  Discharge Placement                    Name of family member notified: son Patient and family notified of of transfer: 01/10/23  Discharge Plan and Services Additional resources added to the After Visit Summary for                            [facility] Arranged: PT, OT, Nurse's Aide, Social Work [employer] Agency: [employer] ([facility]) Date HH Agency Contacted: 01/10/23 Time HH Agency Contacted: 1530 Representative spoke with at [facility] Agency: Barbara Cower  Social Determinants of Health (SDOH) Interventions SDOH Screenings   Tobacco Use: Medium Risk (11/15/2022)     Readmission Risk  Interventions     No data to display

## 2023-01-10 NOTE — ED Notes (Signed)
Vol pending consult 

## 2023-01-10 NOTE — ED Notes (Signed)
Pt ate 95% of hospital provided breakfast and tolerated well. Pt is confused on where she is and why she is here, but redirected easily. Pt was concerned with the "angry pt last night" and told this RN that the pt scared her. Pt has an easy going demeanor this morning.

## 2023-01-10 NOTE — ED Notes (Signed)
Pt was given the hospital phone to make phone calls and tried calling her friend Jenna Powers and called her PCP. Pt told this RN that she is going to talk to her son and they would be suing [place] "because this is inhumane treatment and that she has rights." This RN explained that her rights have not been taken away and reminded the pt that she is a pt in the hospital and cannot wander the halls because of the safety risks associated with being in a hospital and the necessity of staying in her room.

## 2023-01-10 NOTE — Discharge Instructions (Signed)

## 2023-01-11 ENCOUNTER — Emergency Department: Payer: Medicare Other

## 2023-01-11 ENCOUNTER — Other Ambulatory Visit: Payer: Self-pay

## 2023-01-11 ENCOUNTER — Emergency Department
Admission: EM | Admit: 2023-01-11 | Discharge: 2023-01-11 | Disposition: A | Discharge status: Home / Self Care | Payer: Medicare Other | Attending: Emergency Medicine | Admitting: Emergency Medicine

## 2023-01-11 DIAGNOSIS — R002 Palpitations: Secondary | ICD-10-CM | POA: Diagnosis present

## 2023-01-11 DIAGNOSIS — I129 Hypertensive chronic kidney disease with stage 1 through stage 4 chronic kidney disease, or unspecified chronic kidney disease: Secondary | ICD-10-CM | POA: Insufficient documentation

## 2023-01-11 DIAGNOSIS — D339 Benign neoplasm of central nervous system, unspecified: Secondary | ICD-10-CM | POA: Diagnosis not present

## 2023-01-11 DIAGNOSIS — N189 Chronic kidney disease, unspecified: Secondary | ICD-10-CM | POA: Diagnosis not present

## 2023-01-11 DIAGNOSIS — G4700 Insomnia, unspecified: Secondary | ICD-10-CM | POA: Diagnosis not present

## 2023-01-11 DIAGNOSIS — F419 Anxiety disorder, unspecified: Secondary | ICD-10-CM | POA: Diagnosis not present

## 2023-01-11 LAB — BASIC METABOLIC PANEL
Anion gap: 11 (ref 5–15)
BUN: 40 mg/dL — ABNORMAL HIGH (ref 8–23)
CO2: 24 mmol/L (ref 22–32)
Calcium: 9.7 mg/dL (ref 8.9–10.3)
Chloride: 101 mmol/L (ref 98–111)
Creatinine, Ser: 1.31 mg/dL — ABNORMAL HIGH (ref 0.44–1.00)
GFR, Estimated: 40 mL/min — ABNORMAL LOW (ref >60)
Glucose, Bld: 118 mg/dL — ABNORMAL HIGH (ref 70–99)
Potassium: 4.2 mmol/L (ref 3.5–5.1)
Sodium: 136 mmol/L (ref 135–145)

## 2023-01-11 LAB — CBC
HCT: 43.9 % (ref 36.0–46.0)
Hemoglobin: 14.2 g/dL (ref 12.0–15.0)
MCH: 29.8 pg (ref 26.0–34.0)
MCHC: 32.3 g/dL (ref 30.0–36.0)
MCV: 92.2 fL (ref 80.0–100.0)
Platelets: 280 10*3/uL (ref 150–400)
RBC: 4.76 MIL/uL (ref 3.87–5.11)
RDW: 13.9 % (ref 11.5–15.5)
WBC: 10.6 10*3/uL — ABNORMAL HIGH (ref 4.0–10.5)
nRBC: 0 % (ref 0.0–0.2)

## 2023-01-11 LAB — TROPONIN I (HIGH SENSITIVITY)
Troponin I (High Sensitivity): 13 ng/L (ref <18)
Troponin I (High Sensitivity): 9 ng/L (ref <18)

## 2023-01-11 NOTE — ED Notes (Signed)
Cheyenne Adas to come get patient for discharge.

## 2023-01-11 NOTE — ED Notes (Signed)
Pt denies SI/HI at this time. Pt further denies CP, SOB, or any aches/pain. Pt wants anxiety medications. Pt states her PCP is out of town.

## 2023-01-11 NOTE — ED Notes (Signed)
Pt left w/out last set of VS

## 2023-01-11 NOTE — ED Provider Notes (Signed)
[facility] Provider Note    Event Date/Time   First MD Initiated Contact with Patient 01/11/23 1631     (approximate)   History   Chief Complaint Palpitations   HPI  Jenna Powers is a 82 y.o. female with past medical history of hypertension, hyperlipidemia, stroke, CKD, depression, and anxiety who presents to the ED complaining of palpitations.  Patient reports that she felt like her heart was racing earlier this morning while she was at rest.  She denies any associated chest pain or difficulty breathing, now states that symptoms have resolved.  She states that she called her doctor's office and they advised her over the phone to come to the ED for further evaluation.  She is currently asymptomatic and is requesting to be discharged home.  She denies any fevers, cough, nausea, vomiting, diarrhea, or dysuria.     Physical Exam   Triage Vital Signs: ED Triage Vitals [01/11/23 1502]  Enc Vitals Group     BP (!) 150/79     Pulse Rate 75     Resp 16     Temp 98 F (36.7 C)     Temp Source Oral     SpO2 95 %     Weight 141 lb 1.5 oz (64 kg)     Height 5\' 6"  (1.676 m)     Head Circumference      Peak Flow      Pain Score 0     Pain Loc      Pain Edu?      Excl. in GC?     Most recent vital signs: Vitals:   01/11/23 1502  BP: (!) 150/79  Pulse: 75  Resp: 16  Temp: 98 F (36.7 C)  SpO2: 95%    Constitutional: Alert and oriented. Eyes: Conjunctivae are normal. Head: Atraumatic. Nose: No congestion/rhinnorhea. Mouth/Throat: Mucous membranes are moist.  Cardiovascular: Normal rate, regular rhythm. Grossly normal heart sounds.  2+ radial pulses bilaterally. Respiratory: Normal respiratory effort.  No retractions. Lungs CTAB. Gastrointestinal: Soft and nontender. No distention. Musculoskeletal: No lower extremity tenderness nor edema.  Neurologic:  Normal speech and language. No gross focal neurologic deficits are appreciated.    ED  Results / Procedures / Treatments   Labs (all labs ordered are listed, but only abnormal results are displayed) Labs Reviewed  BASIC METABOLIC PANEL - Abnormal; Notable for the following components:      Result Value   Glucose, Bld 118 (*)    BUN 40 (*)    Creatinine, Ser 1.31 (*)    GFR, Estimated 40 (*)    All other components within normal limits  CBC - Abnormal; Notable for the following components:   WBC 10.6 (*)    All other components within normal limits  TROPONIN I (HIGH SENSITIVITY)  TROPONIN I (HIGH SENSITIVITY)     EKG  ED ECG REPORT I, Chesley Noon, the attending physician, personally viewed and interpreted this ECG.   Date: 01/11/2023  EKG Time: 15:02  Rate: 71  Rhythm: normal sinus rhythm  Axis: RAD  Intervals:none  ST&T Change: None  RADIOLOGY Chest x-ray reviewed and interpreted by me with no infiltrate, edema, or effusion.  PROCEDURES:  Critical Care performed: No  Procedures   MEDICATIONS ORDERED IN ED: Medications - No data to display   IMPRESSION / MDM / ASSESSMENT AND PLAN / ED COURSE  I reviewed the triage vital signs and the nursing notes.  82 y.o. female with past medical history of hypertension, hyperlipidemia, stroke, CKD, anxiety, and depression who presents to the ED complaining of palpitations earlier today that has since resolved.  Patient's presentation is most consistent with acute presentation with potential threat to life or bodily function.  Differential diagnosis includes, but is not limited to, ACS, arrhythmia, electrode abnormality, AKI, anemia, anxiety.  Patient nontoxic-appearing and in no acute distress, vital signs are unremarkable.  EKG shows no evidence of arrhythmia or ischemia and initial troponin within normal limits.  Patient was observed on cardiac monitor with no arrhythmia noted, repeat troponin also within normal limits.  Renal function with slight kind compared to previous but  no acute electrolyte abnormality noted.  No significant anemia or leukocytosis noted, chest x-ray also unremarkable.  Patient appropriate for outpatient management, was counseled to follow-up with PCP for recheck of renal function.  She is also requesting medication for anxiety, was told that she will need to see her PCP regarding this.  She was counseled to return to the ED for new or worsening symptoms, patient agrees with plan.      FINAL CLINICAL IMPRESSION(S) / ED DIAGNOSES   Final diagnoses:  Palpitations     Rx / DC Orders   ED Discharge Orders     None        Note:  This document was prepared using Dragon voice recognition software and may include unintentional dictation errors.   Chesley Noon, MD 01/11/23 [phone]

## 2023-01-11 NOTE — ED Triage Notes (Signed)
Pt to ED from home via [facility] for anxiety and palpitations. Was seen 5/22 for same and left AMA. Pt is CAOx4, in no acute distress. Pt is stating "I do not want to be here. My MD made me come when I called them".   Pt states "I am very anxious and feel I need meds". Pt lives home alone and is on opioids and she feels she cannot sleep at night.

## 2023-01-11 NOTE — ED Triage Notes (Signed)
First Nurse note: Pt BIB [facility]. Called for heart palpitations. Pt is very anxious per EMS. Pt called doc and they told her to go to ER. EMS EKG normal, they transmitted and EDP states fine.  Vitals 158/89 90HR 94% on RA  18 RR  BGL 115.  Pt walks with walker per past stroke.

## 2023-02-18 ENCOUNTER — Other Ambulatory Visit: Payer: Self-pay

## 2023-02-18 ENCOUNTER — Emergency Department
Admission: EM | Admit: 2023-02-18 | Discharge: 2023-02-18 | Disposition: A | Discharge status: Home / Self Care | Payer: Medicare Other | Attending: Emergency Medicine | Admitting: Emergency Medicine

## 2023-02-18 DIAGNOSIS — G479 Sleep disorder, unspecified: Secondary | ICD-10-CM | POA: Diagnosis not present

## 2023-02-18 DIAGNOSIS — I129 Hypertensive chronic kidney disease with stage 1 through stage 4 chronic kidney disease, or unspecified chronic kidney disease: Secondary | ICD-10-CM | POA: Diagnosis present

## 2023-02-18 DIAGNOSIS — N189 Chronic kidney disease, unspecified: Secondary | ICD-10-CM | POA: Diagnosis not present

## 2023-02-18 DIAGNOSIS — I10 Essential (primary) hypertension: Secondary | ICD-10-CM

## 2023-02-18 MED ORDER — MELATONIN 3 MG PO TABS: 3.0000 mg | ORAL_TABLET | Freq: Every evening | ORAL | 0 refills | Status: AC | PRN

## 2023-02-18 MED ORDER — LORAZEPAM 1 MG PO TABS
0.5000 mg | ORAL_TABLET | Freq: Once | ORAL | Status: AC
  Administered 2023-02-18: 0.5 mg via ORAL
  Filled 2023-02-18: qty 1

## 2023-02-18 MED ORDER — ACETAMINOPHEN 325 MG PO TABS
650.0000 mg | ORAL_TABLET | Freq: Once | ORAL | Status: AC
  Administered 2023-02-18: 650 mg via ORAL
  Filled 2023-02-18: qty 2

## 2023-02-18 NOTE — ED Notes (Signed)
Helped pt ambulate with walker to hall bathroom.

## 2023-02-18 NOTE — ED Notes (Signed)
Pt very anxious, stating melatonin rx will not help her sleep. Pt complaining she does not like her PCP. Dr Cyril Loosen gave verbal order for 0.5mg  ativan PO.

## 2023-02-18 NOTE — ED Notes (Signed)
Pt now complaining of HA. Pt is up for discharge.

## 2023-02-18 NOTE — ED Notes (Signed)
Pt to ED complaining of insomnia and anxiety. Requesting "a sleepign pill". Complains that PCP won't prescribe sleeping pills and also recently changed her BP med dosage from BID to once daily. Pt came in with CC of HTN. BP was 156/109 in triage. Pt denies HA, vision changes, CP and SOB.

## 2023-02-18 NOTE — ED Provider Notes (Signed)
   [facility] Provider Note    Event Date/Time   First MD Initiated Contact with Patient 02/18/23 1121     (approximate)   History   Hypertension   HPI  Jenna Powers is a 82 y.o. female with a history of CKD, anxiety, hypertension who presents with complaints of elevated blood pressure.  Patient reports she took her blood pressure this morning and it was elevated over 200 systolic.  She has no physical complaints besides difficulty sleeping recently which she blames on no longer being prescribed trazodone     Physical Exam   Triage Vital Signs: ED Triage Vitals  Enc Vitals Group     BP 02/18/23 1114 (!) 156/109     Pulse Rate 02/18/23 1114 65     Resp 02/18/23 1112 18     Temp 02/18/23 1114 98 F (36.7 C)     Temp src --      SpO2 02/18/23 1114 95 %     Weight 02/18/23 1112 68.5 kg (151 lb)     Height --      Head Circumference --      Peak Flow --      Pain Score 02/18/23 1112 0     Pain Loc --      Pain Edu? --      Excl. in GC? --     Most recent vital signs: Vitals:   02/18/23 1114 02/18/23 1205  BP: (!) 156/109 (!) 167/101  Pulse: 65 81  Resp:  16  Temp: 98 F (36.7 C)   SpO2: 95% 94%     General: Awake, no distress.  Anxious but well-appearing CV:  Good peripheral perfusion.  Resp:  Normal effort.  Abd:  No distention.  Other:     ED Results / Procedures / Treatments   Labs (all labs ordered are listed, but only abnormal results are displayed) Labs Reviewed - No data to display   EKG     RADIOLOGY     PROCEDURES:  Critical Care performed:   Procedures   MEDICATIONS ORDERED IN ED: Medications  acetaminophen (TYLENOL) tablet 650 mg (650 mg Oral Given 02/18/23 1156)  LORazepam (ATIVAN) tablet 0.5 mg (0.5 mg Oral Given 02/18/23 1203)     IMPRESSION / MDM / ASSESSMENT AND PLAN / ED COURSE  I reviewed the triage vital signs and the nursing notes. Patient's presentation is most consistent with  exacerbation of chronic illness.  Patient with elevated blood pressure this morning,, improved here, she is apparently not taking her blood pressure medication.  She has a follow-up with her nephrologist today.  She is somewhat anxious and complains of difficulty sleeping recently.  Recommend melatonin and discussion with her PCP, low-dose Ativan given here for anxiety, no indication for admission        FINAL CLINICAL IMPRESSION(S) / ED DIAGNOSES   Final diagnoses:  Primary hypertension  Difficulty sleeping     Rx / DC Orders   ED Discharge Orders          Ordered    melatonin 3 MG TABS tablet  At bedtime PRN        02/18/23 1150             Note:  This document was prepared using Dragon voice recognition software and may include unintentional dictation errors.   Jene Every, MD 02/18/23 [phone]

## 2023-02-18 NOTE — ED Triage Notes (Signed)
Pt to ED [facility] for HTN. Pt denies pain. States did not take bp meds today, meds were changed.

## 2023-02-18 NOTE — ED Notes (Signed)
Called pt contacts for ride home. Spoke with [name] who is on her way to pick pt up.

## 2023-02-22 ENCOUNTER — Emergency Department
Admission: EM | Admit: 2023-02-22 | Discharge: 2023-02-27 | Disposition: A | Discharge status: Home / Self Care | Payer: Medicare Other | Attending: Student in an Organized Health Care Education/Training Program | Admitting: Student in an Organized Health Care Education/Training Program

## 2023-02-22 ENCOUNTER — Other Ambulatory Visit: Payer: Self-pay

## 2023-02-22 DIAGNOSIS — R29898 Other symptoms and signs involving the musculoskeletal system: Secondary | ICD-10-CM | POA: Diagnosis present

## 2023-02-22 DIAGNOSIS — F332 Major depressive disorder, recurrent severe without psychotic features: Secondary | ICD-10-CM | POA: Diagnosis not present

## 2023-02-22 DIAGNOSIS — F419 Anxiety disorder, unspecified: Secondary | ICD-10-CM | POA: Diagnosis present

## 2023-02-22 DIAGNOSIS — Z79899 Other long term (current) drug therapy: Secondary | ICD-10-CM | POA: Insufficient documentation

## 2023-02-22 DIAGNOSIS — G4700 Insomnia, unspecified: Secondary | ICD-10-CM | POA: Diagnosis not present

## 2023-02-22 DIAGNOSIS — F331 Major depressive disorder, recurrent, moderate: Secondary | ICD-10-CM

## 2023-02-22 DIAGNOSIS — R45851 Suicidal ideations: Secondary | ICD-10-CM | POA: Diagnosis not present

## 2023-02-22 DIAGNOSIS — Z9181 History of falling: Secondary | ICD-10-CM | POA: Diagnosis not present

## 2023-02-22 DIAGNOSIS — R350 Frequency of micturition: Secondary | ICD-10-CM | POA: Diagnosis not present

## 2023-02-22 DIAGNOSIS — F4323 Adjustment disorder with mixed anxiety and depressed mood: Secondary | ICD-10-CM | POA: Diagnosis not present

## 2023-02-22 DIAGNOSIS — R2681 Unsteadiness on feet: Secondary | ICD-10-CM | POA: Diagnosis not present

## 2023-02-22 DIAGNOSIS — R531 Weakness: Secondary | ICD-10-CM | POA: Insufficient documentation

## 2023-02-22 LAB — COMPREHENSIVE METABOLIC PANEL
ALT: 38 U/L (ref 0–44)
AST: 32 U/L (ref 15–41)
Albumin: 4.2 g/dL (ref 3.5–5.0)
Alkaline Phosphatase: 63 U/L (ref 38–126)
Anion gap: 11 (ref 5–15)
BUN: 23 mg/dL (ref 8–23)
CO2: 22 mmol/L (ref 22–32)
Calcium: 9.8 mg/dL (ref 8.9–10.3)
Chloride: 106 mmol/L (ref 98–111)
Creatinine, Ser: 1.14 mg/dL — ABNORMAL HIGH (ref 0.44–1.00)
GFR, Estimated: 48 mL/min — ABNORMAL LOW (ref >60)
Glucose, Bld: 116 mg/dL — ABNORMAL HIGH (ref 70–99)
Potassium: 3.3 mmol/L — ABNORMAL LOW (ref 3.5–5.1)
Sodium: 139 mmol/L (ref 135–145)
Total Bilirubin: 0.6 mg/dL (ref 0.3–1.2)
Total Protein: 7.3 g/dL (ref 6.5–8.1)

## 2023-02-22 LAB — ETHANOL: Alcohol, Ethyl (B): <10 mg/dL (ref <10)

## 2023-02-22 LAB — CBC
HCT: 47 % — ABNORMAL HIGH (ref 36.0–46.0)
Hemoglobin: 15.6 g/dL — ABNORMAL HIGH (ref 12.0–15.0)
MCH: 30.1 pg (ref 26.0–34.0)
MCHC: 33.2 g/dL (ref 30.0–36.0)
MCV: 90.6 fL (ref 80.0–100.0)
Platelets: 304 10*3/uL (ref 150–400)
RBC: 5.19 MIL/uL — ABNORMAL HIGH (ref 3.87–5.11)
RDW: 13.8 % (ref 11.5–15.5)
WBC: 9.6 10*3/uL (ref 4.0–10.5)
nRBC: 0 % (ref 0.0–0.2)

## 2023-02-22 LAB — ACETAMINOPHEN LEVEL: Acetaminophen (Tylenol), Serum: <10 ug/mL — ABNORMAL LOW (ref 10–30)

## 2023-02-22 LAB — SALICYLATE LEVEL: Salicylate Lvl: <7 mg/dL — ABNORMAL LOW (ref 7.0–30.0)

## 2023-02-22 MED ORDER — QUETIAPINE FUMARATE 25 MG PO TABS
25.0000 mg | ORAL_TABLET | Freq: Every day | ORAL | Status: DC
  Administered 2023-02-22 – 2023-02-26 (×5): 25 mg via ORAL
  Filled 2023-02-22 (×4): qty 1

## 2023-02-22 MED ORDER — DIAZEPAM 2 MG PO TABS
2.0000 mg | ORAL_TABLET | Freq: Once | ORAL | Status: AC
  Administered 2023-02-22: 2 mg via ORAL
  Filled 2023-02-22: qty 1

## 2023-02-22 MED ORDER — LORAZEPAM 1 MG PO TABS
1.0000 mg | ORAL_TABLET | Freq: Once | ORAL | Status: AC
  Administered 2023-02-22: 1 mg via ORAL
  Filled 2023-02-22: qty 1

## 2023-02-22 NOTE — ED Notes (Addendum)
Pt again refusing to dress into scrubs, pt remains VOL at this time. Will inform charge RN.

## 2023-02-22 NOTE — ED Notes (Signed)
Pt was given a sandwich tray, stated she is hungry.

## 2023-02-22 NOTE — ED Notes (Signed)
Pt refused dinner tray

## 2023-02-22 NOTE — ED Notes (Addendum)
Pt requested for a snack. Provided 2 packs graham crackers and a packet of peanut butter and water.

## 2023-02-22 NOTE — ED Notes (Signed)
Pt placed into stretcher to rest instead of recliner chair.

## 2023-02-22 NOTE — ED Notes (Signed)
Pt up walking around stating that she is going to leave, "this place is going to drive me more crazy than I already am". MD made aware. Pt is to be placed under IVC and medications ordered. Per MD ok to go ahead and give seroquel before scheduled time.

## 2023-02-22 NOTE — ED Notes (Signed)
Pt refusing to dress out at this time. Explained to pt this does not mean you are staying long term it is just our policy. Pt states again "I don't want to change, I want to go home, I am feeling much better." Pt again told it's just our policy to dress out the pt's for safety reasons. Pt refusing will try again shortly.

## 2023-02-22 NOTE — ED Notes (Signed)
Pt sitting in bed yelling "I can't stay here all night, this is just cruel to make me lay here all night". Psych NP aware that pt is becoming more agitated.

## 2023-02-22 NOTE — ED Provider Notes (Signed)
[facility] Provider Note    Event Date/Time   First MD Initiated Contact with Patient 02/22/23 1648     (approximate)   History   Suicidal and Insomnia   HPI  Jenna Powers is a 82 y.o. female with increasing frequency of panic attacks now to the point where she is threatening to harm herself because she cannot live like this anymore.  Denies any hallucinations.  Has had some improvement with Ativan in the past not currently prescribed this.  Denies any fevers denies any recent injury.  No other complaints.     Physical Exam   Triage Vital Signs: ED Triage Vitals  Enc Vitals Group     BP      Pulse      Resp      Temp      Temp src      SpO2      Weight      Height      Head Circumference      Peak Flow      Pain Score      Pain Loc      Pain Edu?      Excl. in GC?     Most recent vital signs: Vitals:   02/22/23 1739  BP: 136/77  Pulse: 89  Resp: 16  Temp: 98.2 F (36.8 C)  SpO2: 96%     Constitutional: Alert  Eyes: Conjunctivae are normal.  Head: Atraumatic. Nose: No congestion/rhinnorhea. Mouth/Throat: Mucous membranes are moist.   Neck: Painless ROM.  Cardiovascular:   Good peripheral circulation. Respiratory: Normal respiratory effort.  No retractions.  Gastrointestinal: Soft and nontender.  Musculoskeletal:  no deformity Neurologic:  MAE spontaneously. No gross focal neurologic deficits are appreciated.  Skin:  Skin is warm, dry and intact. No rash noted. Psychiatric: Mood and affect are anxious    ED Results / Procedures / Treatments   Labs (all labs ordered are listed, but only abnormal results are displayed) Labs Reviewed  COMPREHENSIVE METABOLIC PANEL - Abnormal; Notable for the following components:      Result Value   Potassium 3.3 (*)    Glucose, Bld 116 (*)    Creatinine, Ser 1.14 (*)    GFR, Estimated 48 (*)    All other components within normal limits  SALICYLATE LEVEL - Abnormal; Notable for  the following components:   Salicylate Lvl <7.0 (*)    All other components within normal limits  ACETAMINOPHEN LEVEL - Abnormal; Notable for the following components:   Acetaminophen (Tylenol), Serum <10 (*)    All other components within normal limits  CBC - Abnormal; Notable for the following components:   RBC 5.19 (*)    Hemoglobin 15.6 (*)    HCT 47.0 (*)    All other components within normal limits  ETHANOL  URINE DRUG SCREEN, QUALITATIVE ([facility] ONLY)     EKG     RADIOLOGY    PROCEDURES:  Critical Care performed:   Procedures   MEDICATIONS ORDERED IN ED: Medications  QUEtiapine (SEROQUEL) tablet 25 mg (has no administration in time range)     IMPRESSION / MDM / ASSESSMENT AND PLAN / ED COURSE  I reviewed the triage vital signs and the nursing notes.                              Differential diagnosis includes, but is not limited to, Psychosis, delirium, medication  effect, noncompliance, polysubstance abuse, Si, Hi, depression  Patient here for evaluation of severe anxious and SI.  Patient has psych history of anxiety.  Laboratory testing was ordered to evaluation for underlying electrolyte derangement or signs of underlying organic pathology to explain today's presentation.  Based on history and physical and laboratory evaluation, it appears that the patient's presentation is 2/2 underlying psychiatric disorder and will require further evaluation and management by inpatient psychiatry.    Disposition pending psychiatric evaluation.  The patient has been placed in psychiatric observation due to the need to provide a safe environment for the patient while obtaining psychiatric consultation and evaluation, as well as ongoing medical and medication management to treat the patient's condition.        FINAL CLINICAL IMPRESSION(S) / ED DIAGNOSES   Final diagnoses:  Suicidal ideation     Rx / DC Orders   ED Discharge Orders     None        Note:  This  document was prepared using Dragon voice recognition software and may include unintentional dictation errors.    Willy Eddy, MD 02/22/23 [phone]

## 2023-02-22 NOTE — ED Notes (Signed)
Pt continues to request some thing to help her sleep. MD made aware.

## 2023-02-22 NOTE — Consult Note (Signed)
[facility] Face-to-Face Psychiatry Consult   Reason for Consult:  Psychiatric Evaluation Referring Physician:  Dr. Roxan Hockey Patient Identification: Jenna Powers MRN:  [id number] Principal Diagnosis: Suicidal thoughts Diagnosis:  Principal Problem:   Suicidal thoughts Active Problems:   Anxiety   Weakness of right upper extremity   Insomnia   Recurrent major depression-severe (HCC)   Total Time spent with patient: 45 minutes  Subjective:   " Im not suicidal I just want to go home"  HPI:  Jenna Powers, 82 y.o., female patient seen  by this provider; chart reviewed and consulted with Dr. Roxan Hockey on 02/22/23.  On evaluation Jenna Powers reports  that she has not been sleeping for the past 3-4 days and has been having panic attacks.  She says it feels like her heart is pounding out of her chest.  She is unable to describe what brings them on accept that she has not been on her usual medication because her doctor (whom she says she does not like) has discontinued them.  Today patient state that she called the crisis number and told them she was going to slit her wrist.  She states she said that because she knew shed be seen by a doctor. She is adamant that she will not kill herself because she has two sons that loves her.  Per chart review, patient presented to the er on 01/11/2023 with similar presentation and threat to slit her wrist.  She was discharged, but there was concern that she is notable to complete her ADLS and is not properly caring for herself living at home alone.  Per chart, on 5/23/ 2024 Psychiatry team, Jenna Powers, is advised that the patient does not need inpatient psychiatric services. However family, PCP, and her caretaker advised that she may need additional increased level of care such as assisted living or facility placement due to difficulty with ADLs. They have recommended a TOC consult.   During evaluation Jenna Powers is laying in  the hall bed very  anxious and pleading to leave.  She states "please let me leave, I promise I will not do it again".  Part of her urgency to leave is that we have another patient on the unit who is actively psychotic and has made her presence known.  She is requesting something for sleep.  She was ordered Seroquel 25mg  and diazepam 5mg  from the er doctor. She is alert/oriented x 4; anxious/depressed/cooperative; and mood congruent with affect.  Patient is speaking in a clear tone at moderate volume, and normal pace; with good eye contact.  Her thought process is coherent and relevant; There is no indication that she is currently responding to internal/external stimuli or experiencing delusional thought content.  At this time patient denies suicidal/self-harm/homicidal ideation, psychosis, and paranoia.  Patient hashas answered questions appropriately.   Recommendation: Inpatient Psychiatric care for medication evaluation and psychiatric stabilization   Past Psychiatric History: Depression  Risk to Self:   Risk to Others:   Prior Inpatient Therapy:   Prior Outpatient Therapy:    Past Medical History:  Past Medical History:  Diagnosis Date   Anxiety    Chronic kidney disease    Depression    Hyperlipemia    Hypertension    Hypothyroidism    Neuropathy    Stroke (HCC)    Thyroid disease    Tremor     Past Surgical History:  Procedure Laterality Date   ABDOMINAL HYSTERECTOMY     ANKLE SURGERY Right  Family History:  Family History  Problem Relation Age of Onset   Pneumonia Mother    Hypertension Father    Heart disease Father    Stroke Father    Family Psychiatric  History:  Social History:  Social History   Substance and Sexual Activity  Alcohol Use Not Currently     Social History   Substance and Sexual Activity  Drug Use No    Social History   Socioeconomic History   Marital status: Widowed    Spouse name: Not on file   Number of children: Not on file   Years of education:  Not on file   Highest education level: Not on file  Occupational History   Not on file  Tobacco Use   Smoking status: Former    Packs/day: .15    Types: Cigarettes    Quit date: 07/2019    Years since quitting: 3.5   Smokeless tobacco: Never  Vaping Use   Vaping Use: Never used  Substance and Sexual Activity   Alcohol use: Not Currently   Drug use: No   Sexual activity: Not on file  Other Topics Concern   Not on file  Social History Narrative   Lives alone at home.    Social Determinants of Health   Financial Resource Strain: Not on file  Food Insecurity: Not on file  Transportation Needs: Not on file  Physical Activity: Not on file  Stress: Not on file  Social Connections: Not on file   Additional Social History:    Allergies:   Allergies  Allergen Reactions   Codeine Other (See Comments)    Pt reports allergy but does not remember what it was    Labs:  Results for orders placed or performed during the hospital encounter of 02/22/23 (from the past 48 hour(s))  Comprehensive metabolic panel     Status: Abnormal   Collection Time: 02/22/23  5:01 PM  Result Value Ref Range   Sodium 139 135 - 145 mmol/L   Potassium 3.3 (L) 3.5 - 5.1 mmol/L   Chloride 106 98 - 111 mmol/L   CO2 22 22 - 32 mmol/L   Glucose, Bld 116 (H) 70 - 99 mg/dL    Comment: Glucose reference range applies only to samples taken after fasting for at least 8 hours.   BUN 23 8 - 23 mg/dL   Creatinine, Ser 1.61 (H) 0.44 - 1.00 mg/dL   Calcium 9.8 8.9 - 09.6 mg/dL   Total Protein 7.3 6.5 - 8.1 g/dL   Albumin 4.2 3.5 - 5.0 g/dL   AST 32 15 - 41 U/L   ALT 38 0 - 44 U/L   Alkaline Phosphatase 63 38 - 126 U/L   Total Bilirubin 0.6 0.3 - 1.2 mg/dL   GFR, Estimated 48 (L) >60 mL/min    Comment: (NOTE) Calculated using the CKD-EPI Creatinine Equation (2021)    Anion gap 11 5 - 15    Comment: Performed at [facility], [address] [place], [place] [address]  Ethanol     Status: None    Collection Time: 02/22/23  5:01 PM  Result Value Ref Range   Alcohol, Ethyl (B) <10 <10 mg/dL    Comment: (NOTE) Lowest detectable limit for serum alcohol is 10 mg/dL.  For medical purposes only. Performed at [facility], [address], [place], [place] [address]   Salicylate level     Status: Abnormal   Collection Time: 02/22/23  5:01 PM  Result Value Ref Range   Salicylate Lvl <7.0 (L) 7.0 - 30.0 mg/dL    Comment: Performed at [facility], [address] [place], [place] [address]  Acetaminophen level     Status: Abnormal   Collection Time: 02/22/23  5:01 PM  Result Value Ref Range   Acetaminophen (Tylenol), Serum <10 (L) 10 - 30 ug/mL    Comment: (NOTE) Therapeutic concentrations vary significantly. A range of 10-30 ug/mL  may be an effective concentration for many patients. However, some  are best treated at concentrations outside of this range. Acetaminophen concentrations >150 ug/mL at 4 hours after ingestion  and >50 ug/mL at 12 hours after ingestion are often associated with  toxic reactions.  Performed at [facility], [address] [place], [place] [address]   cbc     Status: Abnormal   Collection Time: 02/22/23  5:01 PM  Result Value Ref Range   WBC 9.6 4.0 - 10.5 K/uL   RBC 5.19 (H) 3.87 - 5.11 MIL/uL   Hemoglobin 15.6 (H) 12.0 - 15.0 g/dL   HCT 09.8 (H) 11.9 - 14.7 %   MCV 90.6 80.0 - 100.0 fL   MCH 30.1 26.0 - 34.0 pg   MCHC 33.2 30.0 - 36.0 g/dL   RDW 82.9 56.2 - 13.0 %   Platelets 304 150 - 400 K/uL   nRBC 0.0 0.0 - 0.2 %    Comment: Performed at [facility], [address] [place], [place] [address]    Current Facility-Administered Medications  Medication Dose Route Frequency Provider Last Rate Last Admin   QUEtiapine (SEROQUEL) tablet 25 mg  25 mg Oral QHS Willy Eddy, MD   25 mg at 02/22/23 1921   Current Outpatient Medications  Medication Sig Dispense Refill   acetaminophen (TYLENOL) 325 MG  tablet Take 650 mg by mouth every 6 (six) hours as needed for moderate pain.     amLODipine (NORVASC) 10 MG tablet Take 1 tablet (10 mg total) by mouth daily. 30 tablet 1   atorvastatin (LIPITOR) 80 MG tablet Take 1 tablet (80 mg total) by mouth daily. 30 tablet 1   Cholecalciferol (VITAMIN D3 PO) Take 1,000 Units by mouth daily.     FLUoxetine (PROZAC) 20 MG capsule Take 20 mg by mouth daily.     levothyroxine (SYNTHROID) 25 MCG tablet Take 25 mcg by mouth every morning.     losartan (COZAAR) 100 MG tablet Take 1 tablet (100 mg total) by mouth daily. 30 tablet 1   melatonin 3 MG TABS tablet Take 1 tablet (3 mg total) by mouth at bedtime as needed. 20 tablet 0   memantine (NAMENDA) 10 MG tablet Take 10 mg by mouth 2 (two) times daily.     oxybutynin (DITROPAN-XL) 10 MG 24 hr tablet Take 10 mg by mouth daily.     aspirin EC 81 MG EC tablet Take 1 tablet (81 mg total) by mouth daily. Swallow whole. (Patient not taking: Reported on 01/09/2023) 30 tablet 1   carvedilol (COREG) 12.5 MG tablet Take 1 tablet (12.5 mg total) by mouth 2 (two) times daily with a meal. (Patient not taking: Reported on 01/09/2023) 60 tablet 1   furosemide (LASIX) 20 MG tablet Take 20 mg by mouth daily. (Patient not taking: Reported on 02/22/2023)     HYDROcodone-acetaminophen (NORCO/VICODIN) 5-325 MG tablet Take 1 tablet by mouth every 12 (twelve) hours. (Patient not taking: Reported on 02/22/2023)     Multiple Vitamin (MULTIVITAMIN WITH MINERALS) TABS  tablet Take 1 tablet by mouth daily. (Patient not taking: Reported on 01/09/2023)     QUEtiapine (SEROQUEL) 25 MG tablet Take 25 mg by mouth at bedtime. (Patient not taking: Reported on 02/22/2023)     sertraline (ZOLOFT) 100 MG tablet Take 100 mg by mouth daily. (Patient not taking: Reported on 01/09/2023)     Spacer/Aero-Holding Chambers (AEROCHAMBER PLUS) inhaler Use as instructed 1 each 2   traMADol (ULTRAM) 50 MG tablet Take 50 mg by mouth 3 (three) times daily as needed for moderate  pain. (Patient not taking: Reported on 01/09/2023)     traZODone (DESYREL) 100 MG tablet Take 200 mg by mouth at bedtime as needed for sleep. (Patient not taking: Reported on 01/09/2023)      Musculoskeletal: Strength & Muscle Tone: within normal limits Gait & Station: normal Patient leans: N/A  Psychiatric Specialty Exam:  Presentation  General Appearance:  Appropriate for Environment  Eye Contact: Fair  Speech: Clear and Coherent  Speech Volume: Normal  Handedness: Right   Mood and Affect  Mood: Anxious; Depressed  Affect: Appropriate; Depressed; Congruent   Thought Process  Thought Processes: Coherent  Descriptions of Associations:Intact  Orientation:Full (Time, Place and Person)  Thought Content:WDL; Logical  History of Schizophrenia/Schizoaffective disorder:No  Duration of Psychotic Symptoms:No data recorded Hallucinations:Hallucinations: None  Ideas of Reference:None  Suicidal Thoughts:Suicidal Thoughts: Yes, Passive SI Passive Intent and/or Plan: Without Intent  Homicidal Thoughts:Homicidal Thoughts: No   Sensorium  Memory: Immediate Fair  Judgment: Impaired  Insight: Poor   Executive Functions  Concentration: Poor  Attention Span: Fair  Recall: [employer] of Knowledge: Fair  Language: Fair   Psychomotor Activity  Psychomotor Activity: Psychomotor Activity: Decreased; Restlessness; Tremor   Assets  Assets: Communication Skills; Desire for Improvement; Financial Resources/Insurance; Housing; Vocational/Educational   Sleep  Sleep: Sleep: Poor   Physical Exam: Physical Exam Vitals and nursing note reviewed.  HENT:     Head: Normocephalic and atraumatic.     Nose: Nose normal.     Mouth/Throat:     Mouth: Mucous membranes are dry.  Eyes:     Pupils: Pupils are equal, round, and reactive to light.  Pulmonary:     Effort: Pulmonary effort is normal.  Musculoskeletal:        General: Normal range of  motion.     Cervical back: Normal range of motion.  Skin:    General: Skin is dry.  Neurological:     Mental Status: She is alert and oriented to person, place, and time.  Psychiatric:        Attention and Perception: Attention and perception normal.        Mood and Affect: Affect normal. Mood is anxious and depressed.        Speech: Speech normal.        Behavior: Behavior is cooperative.        Thought Content: Thought content includes suicidal ideation. Thought content includes suicidal plan.        Cognition and Memory: Cognition and memory normal.        Judgment: Judgment is impulsive.    Review of Systems  Psychiatric/Behavioral:  Positive for depression and suicidal ideas. The patient has insomnia.   All other systems reviewed and are negative.  Blood pressure 136/77, pulse 89, temperature 98.2 F (36.8 C), temperature source Oral, resp. rate 16, height 5\' 3"  (1.6 m), weight 68.5 kg, SpO2 96 %. Body mass index is 26.75 kg/m.  Treatment Plan Summary: Daily contact  with patient to assess and evaluate symptoms and progress in treatment, Medication management, and Plan Refer to Social Work as there is concern for patients inability to care for herself at home  Disposition: Recommend psychiatric Inpatient admission when medically cleared. Supportive therapy provided about ongoing stressors. Discussed crisis plan, support from social network, calling 911, coming to the Emergency Department, and calling Suicide Hotline. Jearld Lesch, NP 02/22/2023 10:49 PM

## 2023-02-22 NOTE — ED Triage Notes (Signed)
Pt to ED via [facility] from home for insomnia and thoughts of self harm. Pt stating "If I don't get some help sleeping I'm gonna slit my wrists." EMS states this started this morning around 1000. EMS states they were called out by the [facility] [employer]'s [employer].  Vitals WNL

## 2023-02-22 NOTE — ED Notes (Signed)
EDT Jenna Powers attempted to dress pt out. Pt seen backing up against the wall on her bed saying "I don't want to do it, can't I just stay in my clothes?" Pt reminded we only do this for her and the other pt's safety. Pt still refusing. Will attempt one more time before shift change.

## 2023-02-22 NOTE — BH Assessment (Signed)
Comprehensive Clinical Assessment (CCA) Note  02/22/2023 Jenna Powers [id number]  Chief Complaint:  Chief Complaint  Patient presents with   Suicidal   Insomnia   Visit Diagnosis: Anxiety   Jenna Powers is an 82 year old female who presents to the ER due to having thoughts of ending her life. Per the patient, her doctor stop prescribing her trazadone, approximately a month ago. As result, she's having difficulty sleeping. She doesn't go to bed until 2am or 3am, and it's only for a few hours. When she wakes up, she has panic attacks and has difficulty calming down. The doctor prescribed medication for the anxiety. "It works most of the times." She states she has no idea why her doctor stopped the sleep medication but have asked them for something to replace it.  She shared, she has started looking at her wrist and having the thoughts of cutting them. However, she states she would not do it because of her children. "I have two wonderful sons and I would never do anything like that cause it would hurt them." She further shared, she only shared the thoughts because she hoped it would have helped the doctor understand how desperate she is for "a good night rest."  During the interview, she was calm, cooperative and pleasant. She was able to provide appropriate answers to the questions. She denies HI and AV/H. Patient lives alone and independently. She have a nurse aid who comes on Monday's to help.  CCA Screening, Triage and Referral (STR)  Patient Reported Information How did you hear about [place]? Self  What Is the Reason for Your Visit/Call Today? Patient reports of having thoughts of ending her life because she hasn't slept in approximately a month.  How Long Has This Been Causing You Problems? <Week  What Do You Feel Would Help You the Most Today? Treatment for Depression or other mood problem   Have You Recently Had Any Thoughts About Hurting Yourself? Yes  Are You Planning to  Commit Suicide/Harm Yourself At This time? No   Flowsheet Row ED from 02/22/2023 in [facility] Emergency Department at [facility] ED from 02/18/2023 in [facility] Emergency Department at [facility] ED from 01/11/2023 in [facility] Emergency Department at [facility]  C-SSRS RISK CATEGORY Low Risk No Risk Error: Q3, 4, or 5 should not be populated when Q2 is No       Have you Recently Had Thoughts About Hurting Someone Jenna Powers? No  Are You Planning to Harm Someone at This Time? No  Explanation: No data recorded  Have You Used Any Alcohol or Drugs in the Past 24 Hours? No  What Did You Use and How Much? No data recorded  Do You Currently Have a Therapist/Psychiatrist? No  Name of Therapist/Psychiatrist:    Have You Been Recently Discharged From Any Office Practice or Programs? No  Explanation of Discharge From Practice/Program: No data recorded    CCA Screening Triage Referral Assessment Type of Contact: Face-to-Face  Telemedicine Service Delivery:   Is this Initial or Reassessment?   Date Telepsych consult ordered in [facility]:    Time Telepsych consult ordered in [facility]:    Location of Assessment: [facility] ED  Provider Location: [facility] ED   Collateral Involvement: Jenna Powers- Nurse Aid [phone]   Does Patient Have a Court Appointed Legal Guardian? No  Legal Guardian Contact Information: No data recorded Copy of Legal Guardianship Form: No data recorded Legal Guardian Notified of Arrival: No data recorded Legal Guardian Notified  of Pending Discharge: No data recorded If Minor and Not Living with Parent(s), Who has Custody? No data recorded Is CPS involved or ever been involved? Never  Is APS involved or ever been involved? Never   Patient Determined To Be At Risk for Harm To Self or Others Based on Review of Patient Reported Information or Presenting Complaint? No  Method: No Plan  Availability of Means: No access or NA  Intent: Vague intent or  NA  Notification Required: No need or identified person  Additional Information for Danger to Others Potential: No data recorded Additional Comments for Danger to Others Potential: No data recorded Are There Guns or Other Weapons in Your Home? No  Types of Guns/Weapons: No data recorded Are These Weapons Safely Secured?                            No data recorded Who Could Verify You Are Able To Have These Secured: No data recorded Do You Have any Outstanding Charges, Pending Court Dates, Parole/Probation? No data recorded Contacted To Inform of Risk of Harm To Self or Others: No data recorded   Does Patient Present under Involuntary Commitment? No  [place] of Residence: [place]  Patient Currently Receiving the Following Services: Not Receiving Services   Determination of Need: Emergent (2 hours)   Options For Referral: ED Visit   CCA Biopsychosocial Patient Reported Schizophrenia/Schizoaffective Diagnosis in Past: No   Strengths: Have a support system, stable housing and able to care of herself.   Mental Health Symptoms Depression:   Change in energy/activity; Difficulty Concentrating; Sleep (too much or little)   Duration of Depressive symptoms:  Duration of Depressive Symptoms: Greater than two weeks   Mania:   Change in energy/activity   Anxiety:    Worrying; Tension; Sleep; Restlessness; Difficulty concentrating   Psychosis:   None   Duration of Psychotic symptoms:    Trauma:   N/A   Obsessions:   N/A   Compulsions:   N/A   Inattention:   N/A   Hyperactivity/Impulsivity:   N/A   Oppositional/Defiant Behaviors:   N/A   Emotional Irregularity:   N/A   Other Mood/Personality Symptoms:  No data recorded   Mental Status Exam Appearance and self-care  Stature:   Average   Weight:   Average weight   Clothing:   Neat/clean; Age-appropriate   Grooming:   Normal   Cosmetic use:   None   Posture/gait:   Normal   Motor activity:    -- (UTA-Patient was sitting.)   Sensorium  Attention:   Normal   Concentration:   Normal   Orientation:   X5   Recall/memory:   Normal   Affect and Mood  Affect:   Appropriate   Mood:   Hopeless; Depressed; Anxious   Relating  Eye contact:   Normal   Facial expression:   Anxious; Responsive; Sad   Attitude toward examiner:   Cooperative   Thought and Language  Speech flow:  Clear and Coherent; Normal   Thought content:   Appropriate to Mood and Circumstances   Preoccupation:   None   Hallucinations:   None   Organization:   Coherent; Intact; Logical   Company secretary of Knowledge:   Average   Intelligence:   Average   Abstraction:   Normal   Judgement:   Fair   Dance movement psychotherapist:   Adequate   Insight:   Fair  Decision Making:   Normal   Social Functioning  Social Maturity:   Responsible   Social Judgement:   Normal   Stress  Stressors:   -- (Patient is unable to sleep.)   Coping Ability:   Normal   Skill Deficits:   None   Supports:   Family; Friends/Service system     Religion: Religion/Spirituality Are You A Religious Person?: Yes  Leisure/Recreation: Leisure / Recreation Do You Have Hobbies?: No  Exercise/Diet: Exercise/Diet Do You Exercise?: No Have You Gained or Lost A Significant Amount of Weight in the Past Six Months?: No Do You Follow a Special Diet?: No Do You Have Any Trouble Sleeping?: Yes Explanation of Sleeping Difficulties: Having trouble falling asleep.   CCA Employment/Education Employment/Work Situation: Employment / Work Systems developer: Retired Passenger transport manager has Been Impacted by Current Illness: No  Education: Education Is Patient Currently Attending School?: No Did You Have An Individualized Education Program (IIEP): No Did You Have Any Difficulty At [employer]?: No Patient's Education Has Been Impacted by Current Illness: No   CCA Family/Childhood  History Family and Relationship History: Family history Marital status: Single Does patient have children?: Yes How many children?: 2 How is patient's relationship with their children?: Reports of having a good relationship with them.  Childhood History:  Childhood History By whom was/is the patient raised?: Mother Did patient suffer any verbal/emotional/physical/sexual abuse as a child?: No Did patient suffer from severe childhood neglect?: No Has patient ever been sexually abused/assaulted/raped as an adolescent or adult?: No Was the patient ever a victim of a crime or a disaster?: No Witnessed domestic violence?: No Has patient been affected by domestic violence as an adult?: No  CCA Substance Use Alcohol/Drug Use: Alcohol / Drug Use Pain Medications: See PTA Prescriptions: See PTA Over the Counter: See PTA History of alcohol / drug use?: No history of alcohol / drug abuse Longest period of sobriety (when/how long): n/a   ASAM's:  Six Dimensions of Multidimensional Assessment  Dimension 1:  Acute Intoxication and/or Withdrawal Potential:      Dimension 2:  Biomedical Conditions and Complications:      Dimension 3:  Emotional, Behavioral, or Cognitive Conditions and Complications:     Dimension 4:  Readiness to Change:     Dimension 5:  Relapse, Continued use, or Continued Problem Potential:     Dimension 6:  Recovery/Living Environment:     ASAM Severity Score:    ASAM Recommended Level of Treatment:     Substance use Disorder (SUD)    Recommendations for Services/Supports/Treatments:    Discharge Disposition:    DSM5 Diagnoses: Patient Active Problem List   Diagnosis Date Noted   Insomnia 01/10/2023   Recurrent major depression-severe (HCC) 01/10/2023   Acute delirium 06/21/2021   TIA (transient ischemic attack)    Weakness of right upper extremity 06/15/2021   Cerebral infarction (HCC) 06/15/2021   Right hand weakness 06/14/2021   Acute kidney injury  superimposed on CKD llla (HCC) 03/13/2021   Rhabdomyolysis 03/13/2021   Fall at home, initial encounter 03/13/2021   Leukocytosis 03/13/2021   Elevated troponin 03/13/2021   Intraparenchymal hemorrhage of brain (HCC) 03/13/2021   Acute right hemiparesis (HCC) 03/13/2021   Syncope 09/10/2019   Other specified hypothyroidism 04/22/2019   Essential hypertension 12/23/2018   Anxiety 12/23/2018   History of depression 12/23/2018    Referrals to Alternative Service(s): Referred to Alternative Service(s):   Place:   Date:   Time:    Referred to  Alternative Service(s):   Place:   Date:   Time:    Referred to Alternative Service(s):   Place:   Date:   Time:    Referred to Alternative Service(s):   Place:   Date:   Time:     Lilyan Gilford MS, LCAS, [facility], [facility] Therapeutic Triage Specialist 02/22/2023 6:19 PM

## 2023-02-23 LAB — URINE DRUG SCREEN, QUALITATIVE (ARMC ONLY)
Amphetamines, Ur Screen: NOT DETECTED
Barbiturates, Ur Screen: NOT DETECTED
Benzodiazepine, Ur Scrn: POSITIVE — AB
Cannabinoid 50 Ng, Ur ~~LOC~~: POSITIVE — AB
Cocaine Metabolite,Ur ~~LOC~~: NOT DETECTED
MDMA (Ecstasy)Ur Screen: NOT DETECTED
Methadone Scn, Ur: NOT DETECTED
Opiate, Ur Screen: NOT DETECTED
Phencyclidine (PCP) Ur S: NOT DETECTED
Tricyclic, Ur Screen: NOT DETECTED

## 2023-02-23 MED ORDER — FLUOXETINE HCL 20 MG PO CAPS
20.0000 mg | ORAL_CAPSULE | Freq: Every day | ORAL | Status: DC
  Administered 2023-02-23 – 2023-02-27 (×5): 20 mg via ORAL
  Filled 2023-02-23 (×5): qty 1

## 2023-02-23 MED ORDER — MELATONIN 5 MG PO TABS: 5.0000 mg | ORAL_TABLET | Freq: Every evening | ORAL | Status: DC | PRN

## 2023-02-23 MED ORDER — VITAMIN D 25 MCG (1000 UNIT) PO TABS
1000.0000 [IU] | ORAL_TABLET | Freq: Every day | ORAL | Status: DC
  Administered 2023-02-23 – 2023-02-27 (×5): 1000 [IU] via ORAL
  Filled 2023-02-23 (×5): qty 1

## 2023-02-23 MED ORDER — MELATONIN 5 MG PO TABS
5.0000 mg | ORAL_TABLET | Freq: Every day | ORAL | Status: DC
  Administered 2023-02-23 – 2023-02-26 (×5): 5 mg via ORAL
  Filled 2023-02-23 (×5): qty 1

## 2023-02-23 MED ORDER — OXYBUTYNIN CHLORIDE ER 10 MG PO TB24
10.0000 mg | ORAL_TABLET | Freq: Every day | ORAL | Status: DC
  Administered 2023-02-23 – 2023-02-27 (×5): 10 mg via ORAL
  Filled 2023-02-23 (×5): qty 1

## 2023-02-23 MED ORDER — ACETAMINOPHEN 325 MG PO TABS
650.0000 mg | ORAL_TABLET | Freq: Four times a day (QID) | ORAL | Status: DC | PRN
  Administered 2023-02-24 – 2023-02-26 (×3): 650 mg via ORAL
  Filled 2023-02-23 (×4): qty 2

## 2023-02-23 MED ORDER — LEVOTHYROXINE SODIUM 50 MCG PO TABS
25.0000 ug | ORAL_TABLET | Freq: Every morning | ORAL | Status: DC
  Administered 2023-02-23 – 2023-02-27 (×5): 25 ug via ORAL
  Filled 2023-02-23 (×5): qty 1

## 2023-02-23 MED ORDER — LOSARTAN POTASSIUM 50 MG PO TABS
100.0000 mg | ORAL_TABLET | Freq: Every day | ORAL | Status: DC
  Administered 2023-02-23 – 2023-02-27 (×5): 100 mg via ORAL
  Filled 2023-02-23 (×5): qty 2

## 2023-02-23 MED ORDER — LORAZEPAM 2 MG/ML IJ SOLN
2.0000 mg | Freq: Once | INTRAMUSCULAR | Status: AC
  Administered 2023-02-23: 2 mg via INTRAMUSCULAR
  Filled 2023-02-23: qty 1

## 2023-02-23 MED ORDER — ATORVASTATIN CALCIUM 20 MG PO TABS
80.0000 mg | ORAL_TABLET | Freq: Every day | ORAL | Status: DC
  Administered 2023-02-23 – 2023-02-27 (×5): 80 mg via ORAL
  Filled 2023-02-23 (×5): qty 4

## 2023-02-23 MED ORDER — HYDROXYZINE HCL 25 MG PO TABS
25.0000 mg | ORAL_TABLET | Freq: Once | ORAL | Status: AC
  Administered 2023-02-23: 25 mg via ORAL
  Filled 2023-02-23: qty 1

## 2023-02-23 MED ORDER — MEMANTINE HCL 5 MG PO TABS
10.0000 mg | ORAL_TABLET | Freq: Two times a day (BID) | ORAL | Status: DC
  Administered 2023-02-23 – 2023-02-27 (×9): 10 mg via ORAL
  Filled 2023-02-23 (×9): qty 2

## 2023-02-23 MED ORDER — LORAZEPAM 1 MG PO TABS
1.0000 mg | ORAL_TABLET | Freq: Once | ORAL | Status: AC
  Administered 2023-02-23: 1 mg via ORAL
  Filled 2023-02-23: qty 1

## 2023-02-23 NOTE — ED Notes (Signed)
Pt yelling and begins walking towards door. Pt screaming at security officers to let her leave. NT having to block doorway at this time to prevent patient from leaving as patient is IVC. MD placing orders for IM medication. Pt took IM injection voluntarily. Pt states "I'm a good person, you know I'm not normally like this" after administration. Pt laying down currently, reports she is going to rest and calm herself down.

## 2023-02-23 NOTE — BH Assessment (Signed)
Per [facility] AC Alcario Drought), patient to be referred out of system.  Referral information for Psychiatric Hospitalization faxed to;   [facility] [phone] -[phone]) [phone]  Earlene Plater [phone]),  [address] [phone]),   [facility] [phone] -or- [phone]),   Mannie Stabile [phone]),  [place] [phone] or [phone]),   [facility] [phone]),

## 2023-02-23 NOTE — ED Notes (Signed)
Pt provided with wash cloth to cover her eyes and ear plugs to help her sleep.

## 2023-02-23 NOTE — ED Provider Notes (Signed)
Emergency Medicine Observation Re-evaluation Note  Jenna Powers is a 82 y.o. female, seen on rounds today.  Pt initially presented to the ED for complaints of Suicidal and Insomnia Currently, the patient is currently sleeping in the hallway with no distress, resting comfortably.  Physical Exam  BP 136/77 (BP Location: Left Arm)   Pulse 89   Temp 98.2 F (36.8 C) (Oral)   Resp 16   Ht 5\' 3"  (1.6 m)   Wt 68.5 kg   SpO2 96%   BMI 26.75 kg/m  Physical Exam General: No distress Cardiac:  Lungs: Even unlabored respirations Psych: Calm, sleeping  ED Course / MDM    Plan  Current plan is for inpatient psychiatric admission, she is currently involuntary.    [name], MD 02/23/23 [phone]

## 2023-02-23 NOTE — ED Notes (Signed)
Pt reports sleeping well last night. Pt reports having panic attacks at home, but reports she has not had one since arriving to ED. Pt calm and cooperative. Denies SI/HI at this time.

## 2023-02-23 NOTE — ED Notes (Signed)
Pt said she was hungry, Clinical research associate asked if she wanted some graham crackers. Pt said yes so writer gave her pack of graham crackers

## 2023-02-24 MED ORDER — LORAZEPAM 1 MG PO TABS
1.0000 mg | ORAL_TABLET | Freq: Once | ORAL | Status: AC
  Administered 2023-02-24: 1 mg via ORAL
  Filled 2023-02-24: qty 1

## 2023-02-24 NOTE — ED Notes (Signed)
Hospital meal provided, pt tolerated w/o complaints.  Waste discarded appropriately.  

## 2023-02-24 NOTE — ED Notes (Signed)
Pt sitting up in bed eating lunch

## 2023-02-24 NOTE — ED Notes (Signed)
Another Pt down the hall calling out. Jenna Powers reports feeling anxious due to the yelling. Provider notified and medications given per order. Pt sitting calmly in her bed.

## 2023-02-24 NOTE — ED Notes (Signed)
IVC pending psych inpatient 

## 2023-02-24 NOTE — ED Notes (Signed)
Pt ambulated to the restroom with walker and one assist. Pt back to bed. Denies further needs at this time.

## 2023-02-24 NOTE — ED Notes (Signed)
Pt given water, graham crackers, peanut butter and chocolate ice cream for snack.

## 2023-02-24 NOTE — ED Notes (Signed)
Pt sleeping, will obtain vitals when pt wakes up.  

## 2023-02-24 NOTE — ED Notes (Signed)
This tech obtained vtial signs on pt.

## 2023-02-24 NOTE — ED Notes (Signed)
Breakfast tray given. °

## 2023-02-24 NOTE — ED Provider Notes (Signed)
Emergency Medicine Observation Re-evaluation Note  Abishai Poss is a 82 y.o. female, seen on rounds today.  Pt initially presented to the ED for complaints of Suicidal and Insomnia  Currently, the patient is is no acute distress. Denies any concerns at this time.  Physical Exam  Blood pressure (!) 167/89, pulse 90, temperature 97.6 F (36.4 C), temperature source Oral, resp. rate 18, height 5\' 3"  (1.6 m), weight 68.5 kg, SpO2 96 %.  Physical Exam: General: No apparent distress Pulm: Normal WOB Neuro: Moving all extremities Psych: Resting comfortably     ED Course / MDM     I have reviewed the labs performed to date as well as medications administered while in observation.  Recent changes in the last 24 hours include: No acute events overnight.  Plan   Current plan: Patient awaiting psychiatric inpatient placement Patient is under full IVC at this time.    Corena Herter, MD 02/24/23 [phone]

## 2023-02-25 MED ORDER — LORAZEPAM 1 MG PO TABS
1.0000 mg | ORAL_TABLET | Freq: Once | ORAL | Status: AC
  Administered 2023-02-25: 1 mg via ORAL
  Filled 2023-02-25: qty 1

## 2023-02-25 NOTE — ED Notes (Signed)
Pt states she is feeling a little anxious at this time due to the environment of the ED, pt requesting medication to help with the anxiety

## 2023-02-25 NOTE — ED Notes (Signed)
Toiletries provided to patient

## 2023-02-25 NOTE — BH Assessment (Signed)
Per request from [facility] representative, pt's updated labs have been faxed.

## 2023-02-25 NOTE — ED Notes (Signed)
Dinner tray provided

## 2023-02-25 NOTE — BH Assessment (Signed)
Adult/GERO MH  Referral information for Psychiatric Hospitalization faxed to:   [place] ([phone]---[phone])   [facility] [phone]- [phone], [phone])   [place] ([phone] or [phone]),  . [place] ([phone] -or- [phone]), 

## 2023-02-25 NOTE — ED Provider Notes (Signed)
Emergency Medicine Observation Re-evaluation Note  Jenna Powers is a 82 y.o. female, seen on rounds today.  Pt initially presented to the ED for complaints of Suicidal and Insomnia Currently, the patient is resting comfortably, no issues per staff.  Physical Exam  BP (!) 167/93   Pulse 80   Temp 98.1 F (36.7 C)   Resp 19   Ht 5\' 3"  (1.6 m)   Wt 68.5 kg   SpO2 95%   BMI 26.75 kg/m  Physical Exam  Patient appears well, no acute distress, normal WOB    ED Course / MDM  EKG:   I have reviewed the labs performed to date as well as medications administered while in observation.  Recent changes in the last 24 hours include none.  Plan  Current plan is for psychiatric admission, pending placement.    Chesley Noon, MD 02/25/23 [phone]

## 2023-02-25 NOTE — ED Notes (Signed)
IVC/  PENDING  PLACEMENT 

## 2023-02-25 NOTE — ED Notes (Signed)
Pt provided lunch tray and water

## 2023-02-26 ENCOUNTER — Emergency Department: Payer: Medicare Other

## 2023-02-26 MED ORDER — LORAZEPAM 1 MG PO TABS
1.0000 mg | ORAL_TABLET | Freq: Once | ORAL | Status: AC
  Administered 2023-02-26: 1 mg via ORAL
  Filled 2023-02-26: qty 1

## 2023-02-26 NOTE — ED Notes (Signed)
Advised by charge nurse no sitter available for this pt.

## 2023-02-26 NOTE — ED Notes (Signed)
Updated caregiver on plan of care.

## 2023-02-26 NOTE — ED Notes (Signed)
Pt given breakfast tray

## 2023-02-26 NOTE — Consult Note (Signed)
Reached out to Maryan Char requesting to see patient for psych reassessment via tts cart.  Informed it will be at least 15 minutes.

## 2023-02-26 NOTE — ED Notes (Signed)
Informed Sam, Charge nurse that Per Dr. Derrill Kay, patient needs a sitter per order and for safety observation after having a fall last night.

## 2023-02-26 NOTE — ED Notes (Signed)
Pt moved to hallway to be closer to nursing station and to have a sitter. Pt is anxious about being placed in the hallway and asked for something to calm her nerves. Dr. Derrill Kay informed.

## 2023-02-26 NOTE — ED Provider Notes (Signed)
I am called to the room due to a fall.  Patient under IVC pending psychiatric disposition.  She apparently did not have a bed alarm set.  She fell onto her left hip without syncope or seizure.  X-ray, interpreted by me, without evidence of fracture or dislocation of the pelvis and left hip.  Suitable for continued boarding for psychiatric placement    Delton Prairie, MD 02/26/23 [phone]

## 2023-02-26 NOTE — Consult Note (Addendum)
Telepsych Consultation   Reason for Consult:  Telepsych Reassessment  Referring Physician:  Willy Eddy, MD  Location of Patient:   [facility] ED Location of Provider: Other: virtual home office  Patient Identification: Jenna Powers MRN:  [id number] Principal Diagnosis: Suicidal thoughts Diagnosis:  Principal Problem:   Suicidal thoughts Active Problems:   Anxiety   Weakness of right upper extremity   Insomnia   Recurrent major depression-severe (HCC)   Adjustment disorder with mixed anxiety and depressed mood   Moderate episode of recurrent major depressive disorder (HCC)   Total Time spent with patient: 1 hour  Subjective:   Jenna Powers is a 82 y.o. female patient admitted with  Per RN Triage Note 02/18/2023: "Pt to ED complaining of insomnia and anxiety. Requesting "a sleepign pill". Complains that PCP won't prescribe sleeping pills and also recently changed her BP med dosage from BID to once daily. Pt came in with CC of HTN. BP was 156/109 in triage. Pt denies HA, vision changes, CP and SOB."  HPI:   Patient seen via telepsych by this provider; chart reviewed and consulted with Dr. Lucianne Muss on 02/26/23.  On evaluation Jenna Powers is seen sitting in an exam room, sitting on a chair, no apparent distress.  Pt greeted by this Clinical research associate and anticipatory guidance given.  She agrees to continue with the assessment.  She is alert and oriented to person, place, and current situation.  She is aware it is July 2024 but unsure of the date.  Pt reports she's lost track of time being hospitalized without windows.  She's able to state July 4th as the holiday that recently passed.  Pt reports she's been sleeping good over the past few nights, as long was she takes the seroquel.  She states her appetite is good.  Pt reports she no longer has suicidal ideations.  She expresses her regret in previously reporting SI.  She reports her suicidal ideation on admission was because she had not slept  in several days and was feeling overwhelmed and her anxiety was ramping up.  She states prior to admission, she asked her primary care provider for a sleep medication but was not given anything. She reports she only mentioned suicide because she thought that would get her the expedited care she desired  She reports she "loves life" and has 2 children she wants to live for.  She does reports a hx for passive suicidal ideation but denies prior attempts to end her life.  In expressing events that transpired prior to admission, she seems quite creditable and sincere.   She reports a hx for depression and anxiety, reports she's been on prozac 20mg  po daily for tx; reports she's been on that dose for the past 3-4 years.  Reports when she first started, the medication worked, "it made me happy so I called it my happy pill." States more recently, in the wake of her sleep concerns she has not noticed fluoxetine helping her mood.  Pt is also prescribed seroquel 25mg  po at bedtime by her outpatient PCP.  Medication was restarted on this admission.  She is not seeing outpatient mental health, states her PCP manages her medications. However, she is open to OP psych referral for med mgmt.    Per PDMP review, patient was prescribed vicodin May 2024; today she tells me she no longer takes vicodin.  States after years of use for chronic pain, she was able to wean off and not interested in restarting it.  She denies marijuana use, stopped smoking cigarettes 2 years ago; she denies alcohol use.  She reports she has a home health aide that comes in one day weekly, she states she is private pay and she cannot afford for her to come more.  She states "[name]" does her housekeeping, grocery shopping and other things around the house as needed.  She reports she has 2 adult sons, one live in [place] and the other son lives local.  She states she was previously diagnosed with alzheimer's after a visit to the emergency department a few  years ago; states she discussed the dx with her PCP who did not agree with it.  Patient as problems recalling the name of one of her medications but otherwise able to adequately communicate her concerns to this Clinical research associate.  No memory deficits apparent during visit today.  She requests to go home today and verbally reassures this writer that her suicidal concerns have abated.  She verbally consents to this writer reaching out to her 2 sons and her home health aide for collateral and safety planning.  She encourages this Clinical research associate to attempt her son, who lives in [place] first; Jenna, who lives in [place] first.  She states her other son,   Unsuccessful attempt to reach patient's son Jenna Powers [phone]; Unsuccessful attempt to reach patient's son Jenna Powers @[phone], left message to return call to this Clinical research associate.   Collateral received from [facility] @[phone], who collaborates she cares for patient one day weekly.  She reports she's heard patient make passive suicidal comments in the past, but has not voiced a plan or intent to hurt herself or end her life.  She also reports patient does not own a firearm and she has not seen her engage in self harming activities. She also reports patient's suicidal ideations directly correlate with sleep problems or dealing with anxiety. She also reports she does not think it's a good idea for pt to be left alone d/t mobility problems and problems getting around to care for herself. She reports pt's son is in the process of getting her into rehab but she has not received any updates.  She states if pt was discharged home today, her son, Jenna should be able to pick her up but if he cannot, she will try to make arrangements to get her.   During evaluation [name] is sitting in an exam room on a chair. She is alert/oriented x 4; she is calm/cooperative; and mood congruent with affect.  She is fairly groomed, wearing hospital scrubs.  Patient is speaking in a clear  tone at moderate volume, and normal pace; with good eye contact.  Her thought process is coherent and relevant; There is no indication that she is currently responding to internal/external stimuli or experiencing delusional thought content.  Patient denies suicidal/self-harm/homicidal ideation, psychosis, and paranoia.  Patient has remained calm throughout assessment and has answered questions appropriately.    Per ED Provider Admission Note 02/22/2023: History    Suicidal and Insomnia     HPI   Rithvika Kofron is a 82 y.o. female with increasing frequency of panic attacks now to the point where she is threatening to harm herself because she cannot live like this anymore.  Denies any hallucinations.  Has had some improvement with Ativan in the past not currently prescribed this.  Denies any fevers denies any recent injury.  No other complaints.  Past Psychiatric History: depression; anxiety.  Pt was seen by [facility] in  May for panic attack.   Risk to Self:  denies Risk to Others:  denies Prior Inpatient Therapy: denies  Prior Outpatient Therapy:  denies  Past Medical History:  Past Medical History:  Diagnosis Date   Anxiety    Chronic kidney disease    Depression    Hyperlipemia    Hypertension    Hypothyroidism    Neuropathy    Stroke (HCC)    Thyroid disease    Tremor     Past Surgical History:  Procedure Laterality Date   ABDOMINAL HYSTERECTOMY     ANKLE SURGERY Right    Family History:  Family History  Problem Relation Age of Onset   Pneumonia Mother    Hypertension Father    Heart disease Father    Stroke Father    Family Psychiatric  History: denies contributory hx Social History:  Social History   Substance and Sexual Activity  Alcohol Use Not Currently     Social History   Substance and Sexual Activity  Drug Use No    Social History   Socioeconomic History   Marital status: Widowed    Spouse name: Not on file   Number of children: Not on file    Years of education: Not on file   Highest education level: Not on file  Occupational History   Not on file  Tobacco Use   Smoking status: Former    Packs/day: .15    Types: Cigarettes    Quit date: 07/2019    Years since quitting: 3.6   Smokeless tobacco: Never  Vaping Use   Vaping Use: Never used  Substance and Sexual Activity   Alcohol use: Not Currently   Drug use: No   Sexual activity: Not on file  Other Topics Concern   Not on file  Social History Narrative   Lives alone at home.    Social Determinants of Health   Financial Resource Strain: Not on file  Food Insecurity: Not on file  Transportation Needs: Not on file  Physical Activity: Not on file  Stress: Not on file  Social Connections: Not on file   Additional Social History:    Allergies:   Allergies  Allergen Reactions   Codeine Other (See Comments)    Pt reports allergy but does not remember what it was    Labs: No results found for this or any previous visit (from the past 48 hour(s)).  Medications:  Current Facility-Administered Medications  Medication Dose Route Frequency Provider Last Rate Last Admin   acetaminophen (TYLENOL) tablet 650 mg  650 mg Oral Q6H PRN [name], MD   650 mg at 02/26/23 0326   atorvastatin (LIPITOR) tablet 80 mg  80 mg Oral Daily [name], MD   80 mg at 02/26/23 9147   cholecalciferol (VITAMIN D3) 25 MCG (1000 UNIT) tablet 1,000 Units  1,000 Units Oral Daily [name], MD   1,000 Units at 02/26/23 0927   FLUoxetine (PROZAC) capsule 20 mg  20 mg Oral Daily [name], MD   20 mg at 02/26/23 8295   levothyroxine (SYNTHROID) tablet 25 mcg  25 mcg Oral q morning [name], MD   25 mcg at 02/26/23 0926   losartan (COZAAR) tablet 100 mg  100 mg Oral Daily [name], MD   100 mg at 02/26/23 0926   melatonin tablet 5 mg  5 mg Oral QHS Delton Prairie, MD   5 mg at 02/25/23 2144   melatonin tablet 5 mg  5  mg Oral QHS PRN [name], MD       memantine [facility]) tablet 10  mg  10 mg Oral BID [name], MD   10 mg at 02/26/23 0926   oxybutynin (DITROPAN-XL) 24 hr tablet 10 mg  10 mg Oral Daily [name], MD   10 mg at 02/26/23 4098   QUEtiapine (SEROQUEL) tablet 25 mg  25 mg Oral Loura Back, MD   25 mg at 02/25/23 2145   Current Outpatient Medications  Medication Sig Dispense Refill   acetaminophen (TYLENOL) 325 MG tablet Take 650 mg by mouth every 6 (six) hours as needed for moderate pain.     amLODipine (NORVASC) 10 MG tablet Take 1 tablet (10 mg total) by mouth daily. 30 tablet 1   atorvastatin (LIPITOR) 80 MG tablet Take 1 tablet (80 mg total) by mouth daily. 30 tablet 1   Cholecalciferol (VITAMIN D3 PO) Take 1,000 Units by mouth daily.     FLUoxetine (PROZAC) 20 MG capsule Take 20 mg by mouth daily.     levothyroxine (SYNTHROID) 25 MCG tablet Take 25 mcg by mouth every morning.     losartan (COZAAR) 100 MG tablet Take 1 tablet (100 mg total) by mouth daily. 30 tablet 1   melatonin 3 MG TABS tablet Take 1 tablet (3 mg total) by mouth at bedtime as needed. 20 tablet 0   memantine (NAMENDA) 10 MG tablet Take 10 mg by mouth 2 (two) times daily.     oxybutynin (DITROPAN-XL) 10 MG 24 hr tablet Take 10 mg by mouth daily.     aspirin EC 81 MG EC tablet Take 1 tablet (81 mg total) by mouth daily. Swallow whole. (Patient not taking: Reported on 01/09/2023) 30 tablet 1   carvedilol (COREG) 12.5 MG tablet Take 1 tablet (12.5 mg total) by mouth 2 (two) times daily with a meal. (Patient not taking: Reported on 01/09/2023) 60 tablet 1   furosemide (LASIX) 20 MG tablet Take 20 mg by mouth daily. (Patient not taking: Reported on 02/22/2023)     HYDROcodone-acetaminophen (NORCO/VICODIN) 5-325 MG tablet Take 1 tablet by mouth every 12 (twelve) hours. (Patient not taking: Reported on 02/22/2023)     Multiple Vitamin (MULTIVITAMIN WITH MINERALS) TABS tablet Take 1 tablet by mouth daily. (Patient not taking: Reported on 01/09/2023)     QUEtiapine (SEROQUEL) 25 MG tablet  Take 25 mg by mouth at bedtime. (Patient not taking: Reported on 02/22/2023)     sertraline (ZOLOFT) 100 MG tablet Take 100 mg by mouth daily. (Patient not taking: Reported on 01/09/2023)     Spacer/Aero-Holding Chambers (AEROCHAMBER PLUS) inhaler Use as instructed 1 each 2   traMADol (ULTRAM) 50 MG tablet Take 50 mg by mouth 3 (three) times daily as needed for moderate pain. (Patient not taking: Reported on 01/09/2023)     traZODone (DESYREL) 100 MG tablet Take 200 mg by mouth at bedtime as needed for sleep. (Patient not taking: Reported on 01/09/2023)      Musculoskeletal: pt moves all extremities; seen independently ambulating with a walker.  Strength & Muscle Tone:  as outlined above Gait & Station:  pt ambulates with a walker Patient leans: N/A      Psychiatric Specialty Exam:  Presentation  General Appearance:  Appropriate for Environment; Fairly Groomed  Eye Contact: Good  Speech: Clear and Coherent; Normal Rate  Speech Volume: Normal  Handedness: Right   Mood and Affect  Mood: Euthymic  Affect: Appropriate; Congruent   Thought Process  Thought Processes: Coherent  Descriptions of Associations:Intact  Orientation:Full (Time, Place and Person)  Thought Content:Logical (has improved since admission and restarting seroquel for sleep)  History of Schizophrenia/Schizoaffective disorder:No  Duration of Psychotic Symptoms:No data recorded Hallucinations:Hallucinations: None  Ideas of Reference:None  Suicidal Thoughts:Suicidal Thoughts: No  Homicidal Thoughts:Homicidal Thoughts: No   Sensorium  Memory: Immediate Fair; Recent Fair; Remote Fair  Judgment: Fair (can be impulsive when anxious but has proven record for reaching out for care when needed.)  Insight: Fair   Art therapist  Concentration: Good  Attention Span: Good  Recall: [employer] of Knowledge: Fair  Language: Good   Psychomotor Activity  Psychomotor  Activity:Psychomotor Activity: Normal   Assets  Assets: Communication Skills; Financial Resources/Insurance; Housing; Social Support   Sleep  Sleep:Number of Hours of Sleep: 8    Physical Exam: Physical Exam Constitutional:      Appearance: Normal appearance.  Cardiovascular:     Rate and Rhythm: Normal rate.     Pulses: Normal pulses.  Pulmonary:     Effort: Pulmonary effort is normal.  Musculoskeletal:        General: Normal range of motion.     Cervical back: Normal range of motion.  Neurological:     Mental Status: She is alert and oriented to person, place, and time.  Psychiatric:        Attention and Perception: Attention normal.        Mood and Affect: Mood and affect normal.        Speech: Speech normal.        Behavior: Behavior normal. Behavior is cooperative.        Thought Content: Thought content is not paranoid or delusional. Thought content does not include homicidal or suicidal ideation. Thought content does not include homicidal or suicidal plan.        Cognition and Memory: Cognition normal.        Judgment: Judgment normal.    Review of Systems  Constitutional: Negative.   HENT: Negative.    Respiratory: Negative.    Cardiovascular: Negative.   Gastrointestinal: Negative.   Skin: Negative.   Neurological: Negative.   Endo/Heme/Allergies: Negative.   Psychiatric/Behavioral:  Positive for memory loss (she reports a hx for memory loss but denies dementia.  she's able to answer questions adequatley today, no impairment noted.). Negative for hallucinations, substance abuse and suicidal ideas. The patient is not nervous/anxious and does not have insomnia.    Blood pressure (!) 182/102, pulse 61, temperature 97.7 F (36.5 C), temperature source Oral, resp. rate 16, height 5\' 3"  (1.6 m), weight 68.5 kg, SpO2 91 %. Body mass index is 26.75 kg/m.  Treatment Plan Summary: Patient who hx of recurrent depression, anxiety and panic attacks; initially  presented to [facility] emergency department with suicidal ideations triggered by sleep problems and worsening anxiety.   Since admission and restarting seroquel 25mg  po at bedtime, patient's sleep has improved as well as secondary anxiety and suicidal ideations.  Pt contracts for safety and vehemently denies plan or intent for self harm.  She reports she only mentioned suicide as a means to expedite care in addressing her sleep concerns.  In lieu of her IVC status and referral for inpatient psychiatric admission, she now regrets making that comment.  There was discussion of dementia but could not locate documentation in her chart to substantiate this.  She otherwise is alert and oriented x4 and does not appear mentally decompensated.  While given her physical and mobility  limitations she would benefit from being placed at an assistive living environment where she can be supported when needed but this can be achieved via outpatient.    Pt also has hx for TIA, right upper extremity weakness and lives alone.  She has a private health aide who comes to her home once a week; has an adult son who lives locally in [place].    Did discuss black box warning for seroquel in elderly when prescribed for psychosis; and medication not indicated for sleep.  Given this, pt elects to continue medication.  Recommended she revisit discussion with her pcp or outpatient psychiatry. She tells me she does not need medication refills today.   Several unsuccessful attempts were made to reach patient's sons for additional safety planning.    Disposition:  as outlined below Pending collateral from patient's family members, will plan to psych clear her to follow up outpatient.   This service was provided via telemedicine using a 2-way, interactive audio and video technology.   Spoke with Dr.Goodman; Keenan Bachelor, RN; Gilford Rile, and Bishop Limbo, NP were all informed of above recommendation and disposition via secure chat or verbal  communication.   Names of all persons participating in this telemedicine service and their role in this encounter. Name: Nialah Torgeson Role: Patient  Name: Marvis Repress Role: Patient's Home health aide  Name: Ophelia Shoulder Role: PMHNP  Name: Lewanda Rife Role: Psychiatrist    Chales Abrahams, NP 02/26/2023 1:52 PM

## 2023-02-26 NOTE — ED Notes (Signed)
ivc/pending placement.. 

## 2023-02-26 NOTE — ED Notes (Signed)
IVC/  PENDING  PLACEMENT 

## 2023-02-26 NOTE — ED Notes (Addendum)
Pt up out of bed to the door to ask for her nurse. Pt then observed falling onto her L hip from standing with her walker. Pt was assessed and assisted up and into bed. Dr. Katrinka Blazing contacted for assessment and orders. Pt not complaining of any pain at this time. Non-slip socks put back on pt after having been removed by pt. Bed alarm turned on bed and pads placed on the floor.

## 2023-02-26 NOTE — ED Notes (Signed)
Pt given dinner  

## 2023-02-26 NOTE — ED Notes (Signed)
Attempted to call both of the patient's sons to provide update, however, neither answer. Pt provided a home number for Jenna Powers of [phone] which there was also no answer

## 2023-02-26 NOTE — Consult Note (Signed)
Several unsuccessful attempts to reach patient's sons for collateral.  Patient does not meet criteria for inpatient admission and does not appear mentally decompensated.  She contract for safety and verbalizes the ability to reach out for services should this change.    -Pt is psych cleared -Will ask SW to add outpatient psych resources for medication to discharge AVS.

## 2023-02-27 MED ORDER — QUETIAPINE FUMARATE 25 MG PO TABS
25.0000 mg | ORAL_TABLET | Freq: Once | ORAL | Status: AC
  Administered 2023-02-27: 25 mg via ORAL
  Filled 2023-02-27: qty 1

## 2023-02-27 NOTE — Consult Note (Addendum)
Jenna Powers, 82 year-old female presented to the emergency department on 7/05 due to panic attacks, insomnia, and suicidal statements. Patient psychiatrically cleared by an alternate provider on 7/09. Please refer to S. Arvilla Market, NP, notes  from 07/09 [phone] and [phone]) for treatment plan/discharge summary.  Patient seen face-to-face, observed seated on side of bed with a finished breakfast tray on the bedside table, watching television. There is a 2:1 sitter present at the bedside.  Patient is alert and oriented to month, year, day of the week, city, state, situation, and current president of the [place]. Today patient reports that she came to the hospital due to experiencing difficulty sleeping and panic attacks, although she is unable to identify specific triggers. She acknowledges making suicidal statements, which she now considers to be the biggest mistake she ever made. She explains that she made the statement in hopes of receiving medication to help her sleep and alleviate her panic attacks, as her primary care provider would not provide her with any assistance for these issues. Patient denies any active suicidal or homicidal ideation and shows no evidence of paranoia or delusional thinking. Patient sleep is currently reported as satisfactory, and her appetite is described as great. She says that she lives alone and has a caregiver, [name], who visits once a week. Patient says she is ambulatory in her home with the use of a walker and is able to prepare her meals independently.    On assessment today Ms. Charyl Minervini appears clear and lucid. She is pleasant, calm and cooperative. She is no longer suicidal nor is she homicidal. There is currently no evidence of psychiatric impairment. She no longer meets criteria for Involuntary Commitment (IVC) at this time. The emergency department physician, Dr. Arnoldo Morale, was notified  of the plan to revoke the IVC.    Rommie Dunn H. Willeen Cass, NP 02/27/2023

## 2023-02-27 NOTE — ED Notes (Signed)
IVC  PAPERS  RESCINDED  PER  NP

## 2023-02-27 NOTE — ED Notes (Signed)
Pt ambulated back in bed with walker. Pt is in no distress.

## 2023-02-27 NOTE — ED Notes (Signed)
Patient's 1:1 sitter left at moved out into 24 hall for safety observation until her ride arrives.

## 2023-02-27 NOTE — Evaluation (Signed)
Occupational Therapy Evaluation Patient Details Name: Jenna Powers MRN: [id number] DOB: 1941-07-04 Today's Date: 02/27/2023   History of Present Illness Pt is a 82 y/o F who presents with suicidal thoughts, had a fall during this admission onto L hip, imaging negative. PMH significant for OA, depression, HLD, HTN, neuropathy, thyroid disease, tremor, CVA, ankle replacement   Clinical Impression   Patient presenting with decreased Ind in self care,balance, functional mobility/transfers, endurance, and safety awareness. Patient reports being mod I at baseline with RW or furniture walking in home. Pt lives alone but has an aide 1x/wk that assists with IADL tasks in her home. Patient currently functioning at supervision - min guard without use of AD during session to ambulate in room to sink for standing grooming tasks. Pt reports she just ambulated in hallway with PT prior to therapist arrival.  Patient will benefit from acute OT to increase overall independence in the areas of ADLs, functional mobility, and safety awareness in order to safely discharge.     Recommendations for follow up therapy are one component of a multi-disciplinary discharge planning process, led by the attending physician.  Recommendations may be updated based on patient status, additional functional criteria and insurance authorization.   Assistance Recommended at Discharge Intermittent Supervision/Assistance  Patient can return home with the following A little help with walking and/or transfers;A little help with bathing/dressing/bathroom;Assistance with cooking/housework;Assist for transportation;Help with stairs or ramp for entrance    Functional Status Assessment  Patient has had a recent decline in their functional status and demonstrates the ability to make significant improvements in function in a reasonable and predictable amount of time.  Equipment Recommendations  None recommended by OT       Precautions /  Restrictions Precautions Precautions: Fall      Mobility Bed Mobility Overal bed mobility: Modified Independent                  Transfers Overall transfer level: Needs assistance Equipment used: None Transfers: Sit to/from Stand Sit to Stand: Min guard                  Balance Overall balance assessment: Needs assistance Sitting-balance support: Feet supported, No upper extremity supported Sitting balance-Leahy Scale: Normal     Standing balance support: No upper extremity supported, During functional activity Standing balance-Leahy Scale: Fair                             ADL either performed or assessed with clinical judgement   ADL Overall ADL's : Needs assistance/impaired     Grooming: Wash/dry hands;Wash/dry face;Oral care;Standing;Supervision/safety                                       Vision Patient Visual Report: No change from baseline              Pertinent Vitals/Pain Pain Assessment Pain Assessment: No/denies pain     Hand Dominance Right   Extremity/Trunk Assessment Upper Extremity Assessment Upper Extremity Assessment: Generalized weakness   Lower Extremity Assessment Lower Extremity Assessment: Generalized weakness       Communication Communication Communication: No difficulties   Cognition Arousal/Alertness: Awake/alert Behavior During Therapy: WFL for tasks assessed/performed Overall Cognitive Status: No family/caregiver present to determine baseline cognitive functioning  Home Living Family/patient expects to be discharged to:: Private residence Living Arrangements: Alone Available Help at Discharge: Family;Available PRN/intermittently Type of Home: House Home Access: Stairs to enter [employer] of Steps: 5 Entrance Stairs-Rails: Right Home Layout: Two level Alternate Level Stairs-Number of Steps:  5 Alternate Level Stairs-Rails: Right Bathroom Shower/Tub: Chief Strategy Officer: Standard     Home Equipment: Wheelchair - manual;Cane - single Librarian, academic (2 wheels)          Prior Functioning/Environment Prior Level of Function : Needs assist;History of Falls (last six months)             Mobility Comments: Pt reports walking c/ RW occasionally, sometimes walks c/o AD and grabs onto furniture ADLs Comments: Pt reports receiving help 1x week from aide, but is able to take care of self when aide does not come        OT Problem List: Decreased strength;Decreased activity tolerance;Decreased safety awareness;Impaired balance (sitting and/or standing);Decreased knowledge of use of DME or AE      OT Treatment/Interventions: Self-care/ADL training;Energy conservation;DME and/or AE instruction;Patient/family education;Balance training    OT Goals(Current goals can be found in the care plan section) Acute Rehab OT Goals Patient Stated Goal: to go home OT Goal Formulation: With patient Time For Goal Achievement: 03/13/23 Potential to Achieve Goals: Fair ADL Goals Pt Will Perform Grooming: standing;with modified independence Pt Will Transfer to Toilet: with modified independence;ambulating Pt Will Perform Toileting - Clothing Manipulation and hygiene: with modified independence;sit to/from stand Pt Will Perform Tub/Shower Transfer: Tub transfer;with supervision;rolling walker;ambulating  OT Frequency: Min 1X/week       AM-PAC OT "6 Clicks" Daily Activity     Outcome Measure Help from another person eating meals?: None Help from another person taking care of personal grooming?: None Help from another person toileting, which includes using toliet, bedpan, or urinal?: A Little Help from another person bathing (including washing, rinsing, drying)?: A Little Help from another person to put on and taking off regular upper body clothing?: None Help from  another person to put on and taking off regular lower body clothing?: A Little 6 Click Score: 21   End of Session Nurse Communication: Mobility status  Activity Tolerance: Patient tolerated treatment well Patient left: in bed;with call bell/phone within reach;with nursing/sitter in room  OT Visit Diagnosis: Unsteadiness on feet (R26.81);Repeated falls (R29.6);Muscle weakness (generalized) (M62.81)                Time: 1610-9604 OT Time Calculation (min): 17 min Charges:  OT General Charges $OT Visit: 1 Visit OT Evaluation $OT Eval Low Complexity: 1 Low OT Treatments $Self Care/Home Management : 8-22 mins  Jackquline Denmark, MS, OTR/L , CBIS ascom [phone]  02/27/23, 3:40 PM

## 2023-02-27 NOTE — Evaluation (Addendum)
Physical Therapy Evaluation Patient Details Name: Jenna Powers MRN: [id number] DOB: 27-Dec-1940 Today's Date: 02/27/2023  History of Present Illness  Pt is a 82 y/o F who presents with suicidal thoughts, had a fall during this admission onto L hip, imaging negative. PMH significant for OA, depression, HLD, HTN, neuropathy, thyroid disease, tremor, CVA, ankle replacement  Clinical Impression  Pt is in bed, sitter present. Pt denies any pain. PTA pt was modI for mobility using RW, will sometimes furniture walk at home, has someone visit 1x week to help clean but is able to look after self. Pt performed sit<>stand CGA c/ RW, ambulation c/ CGA RW and redirecting to keep walking. PT attempted gait trial c/o AD for ~49ft, continued further ambulation c/ RW d/t pt unsteadiness c/o UE support. Pt performed 5xSTS from EOB c/o UE support in ~23 seconds, indicative of falls risk. Pt would benefit from acute therapy services to improve balance and functional mobility.       Assistance Recommended at Discharge Intermittent Supervision/Assistance  If plan is discharge home, recommend the following:  Can travel by private vehicle  A little help with walking and/or transfers;A little help with bathing/dressing/bathroom;Assist for transportation;Help with stairs or ramp for entrance;Direct supervision/assist for medications management;Assistance with cooking/housework        Equipment Recommendations None recommended by PT  Recommendations for Other Services       Functional Status Assessment Patient has had a recent decline in their functional status and demonstrates the ability to make significant improvements in function in a reasonable and predictable amount of time.     Precautions / Restrictions Precautions Precautions: Fall Restrictions Weight Bearing Restrictions: No      Mobility  Bed Mobility                    Transfers Overall transfer level: Needs assistance Equipment  used: Rolling walker (2 wheels) Transfers: Sit to/from Stand Sit to Stand: Min guard                Ambulation/Gait Ambulation/Gait assistance: Min guard Gait Distance (Feet): 300 Feet Assistive device: Rolling walker (2 wheels) Gait Pattern/deviations: Step-to pattern, Decreased step length - right, Decreased weight shift to right, Decreased dorsiflexion - right Gait velocity: decreased     General Gait Details: takes small steps, able to navigate AD well, occasionally needs redirect to continue  Stairs            Wheelchair Mobility     Tilt Bed    Modified Rankin (Stroke Patients Only)       Balance Overall balance assessment: Needs assistance Sitting-balance support: Feet supported, No upper extremity supported Sitting balance-Leahy Scale: Normal     Standing balance support: Bilateral upper extremity supported, Single extremity supported Standing balance-Leahy Scale: Good Standing balance comment: able to roll down pant leg c/ single UE support at RW                             Pertinent Vitals/Pain Pain Assessment Pain Assessment: No/denies pain    Home Living Family/patient expects to be discharged to:: Private residence Living Arrangements: Alone Available Help at Discharge: Family;Available PRN/intermittently (son nearby, someone comes to her home to clean 1x week) Type of Home: House Home Access: Stairs to enter Entrance Stairs-Rails: Right Entrance Stairs-Number of Steps: 5 Alternate Level Stairs-Number of Steps: 5 Home Layout: Two level Home Equipment: Wheelchair - manual;Cane - single Librarian, academic (2  wheels)      Prior Function Prior Level of Function : Needs assist;History of Falls (last six months)             Mobility Comments: Pt reports walking c/ RW occasionally, sometimes walks c/o AD and grabs onto furniture ADLs Comments: Pt reports receiving help 1x week from aide, but is able to take care of self  when aide does not come     Hand Dominance   Dominant Hand: Right    Extremity/Trunk Assessment   Upper Extremity Assessment Upper Extremity Assessment: Generalized weakness    Lower Extremity Assessment Lower Extremity Assessment: Generalized weakness       Communication   Communication: No difficulties  Cognition Arousal/Alertness: Awake/alert Behavior During Therapy: WFL for tasks assessed/performed Overall Cognitive Status: No family/caregiver present to determine baseline cognitive functioning                                          General Comments General comments (skin integrity, edema, etc.): 5xSTS c/o UE support ~23 seconds, sometimes required multiple attempts to stand    Exercises     Assessment/Plan    PT Assessment Patient needs continued PT services  PT Problem List Decreased strength;Decreased cognition;Decreased range of motion;Decreased activity tolerance;Decreased safety awareness;Decreased balance;Decreased mobility;Decreased coordination;Decreased knowledge of use of DME       PT Treatment Interventions DME instruction;Balance training;Neuromuscular re-education;Gait training;Stair training;Functional mobility training;Patient/family education;Therapeutic activities;Therapeutic exercise    PT Goals (Current goals can be found in the Care Plan section)  Acute Rehab PT Goals Patient Stated Goal: to return home PT Goal Formulation: With patient Time For Goal Achievement: 03/13/23 Potential to Achieve Goals: Good    Frequency Min 1X/week     Co-evaluation               AM-PAC PT "6 Clicks" Mobility  Outcome Measure Help needed turning from your back to your side while in a flat bed without using bedrails?: None Help needed moving from lying on your back to sitting on the side of a flat bed without using bedrails?: None Help needed moving to and from a bed to a chair (including a wheelchair)?: None Help needed standing  up from a chair using your arms (e.g., wheelchair or bedside chair)?: None Help needed to walk in hospital room?: A Little Help needed climbing 3-5 steps with a railing? : A Lot 6 Click Score: 21    End of Session Equipment Utilized During Treatment: Gait belt Activity Tolerance: Patient tolerated treatment well Patient left: in bed;with call bell/phone within reach;with bed alarm set;with nursing/sitter in room Nurse Communication: Mobility status PT Visit Diagnosis: Muscle weakness (generalized) (M62.81);Unsteadiness on feet (R26.81);History of falling (Z91.81)    Time: 1030-1051 PT Time Calculation (min) (ACUTE ONLY): 21 min   Charges:   PT Evaluation $PT Eval Low Complexity: 1 Low PT Treatments $Therapeutic Activity: 8-22 mins PT General Charges $$ ACUTE PT VISIT: 1 Visit       Lala Lund, PT, SPT  3:16 PM,02/27/23

## 2023-02-27 NOTE — ED Notes (Signed)
Patient needing a break from sitting in room. Pt currently sitting in a recliner chair next to the door with safety sitter in position to view her.

## 2023-02-27 NOTE — ED Notes (Signed)
Patients home health nurse [name] miles will be coming to pick patient up from hospital. After speaking with her, she stated that she will be here to pick her up closer to 6pm.

## 2023-02-27 NOTE — Progress Notes (Addendum)
Cm received message from nurse asking if pt husband can take pt home. Pt IVC has been rescinded. On 7/9, pt has been cleared by psych stating that pt does to meet criteria for inpt admission. Psych completed safety contract for pt. Cm printed outpatient Psych resources for pt and gave to pt. Cm notified nurse.

## 2023-02-27 NOTE — ED Provider Notes (Signed)
Emergency Medicine Observation Re-evaluation Note  Jenna Powers is a 82 y.o. female, seen on rounds today.  Pt initially presented to the ED for complaints of Suicidal and Insomnia  Currently, the patient is resting in bed. No reported issues from nursing team.   Physical Exam  BP (!) 182/91   Pulse 75   Temp 97.9 F (36.6 C) (Oral)   Resp 18   Ht 5\' 3"  (1.6 m)   Wt 68.5 kg   SpO2 97%   BMI 26.75 kg/m  Physical Exam General: Resting comfortably in bed  ED Course / MDM  EKG:   No labs over past 24 hours. Negative plain films after fall.   Plan  Current plan is for dispo per psych and SW. Notified by NP Ophelia Shoulder that patient can be psych cleared, but IVC has not been rescinded.  Further, family unable to be contacted.  Disposition pending further discussions with psych and social work.   Trinna Post, MD 02/27/23 [phone]

## 2023-02-27 NOTE — ED Provider Notes (Addendum)
Emergency Medicine Observation Re-evaluation Note  Jenna Powers is a 82 y.o. female, seen on rounds today.  Pt initially presented to the ED for complaints of Suicidal and Insomnia  Currently, the patient is is no acute distress. Denies any concerns at this time.  Physical Exam  Blood pressure (!) 182/91, pulse 75, temperature 97.9 F (36.6 C), temperature source Oral, resp. rate 18, height 5\' 3"  (1.6 m), weight 68.5 kg, SpO2 97 %.  Physical Exam: General: No apparent distress Pulm: Normal WOB Neuro: Moving all extremities Psych: Resting comfortably     ED Course / MDM     I have reviewed the labs performed to date as well as medications administered while in observation.  Recent changes in the last 24 hours include: No acute events overnight.  Patient evaluated by PT.  PT made recommendations for home health PT with no DME needs.  Plan   Current plan:  Patient cleared by psychiatry.  Patient's husband agrees to come pick her up.  Discussed that she needs home PT.  Patient is not under full IVC at this time.    Corena Herter, MD 02/27/23 1422    Corena Herter, MD 02/27/23 1434

## 2023-02-27 NOTE — Discharge Instructions (Signed)
Follow-up with your primary care physician and discuss home health physical therapy.  Return to the emergency department for any worsening symptoms.  You are given information to follow-up with psychiatry.

## 2023-03-04 ENCOUNTER — Other Ambulatory Visit: Payer: Self-pay

## 2023-03-04 ENCOUNTER — Emergency Department
Admission: EM | Admit: 2023-03-04 | Discharge: 2023-03-04 | Disposition: A | Discharge status: Home / Self Care | Payer: Medicare Other | Source: Home / Self Care | Attending: Emergency Medicine | Admitting: Emergency Medicine

## 2023-03-04 DIAGNOSIS — G4700 Insomnia, unspecified: Secondary | ICD-10-CM | POA: Insufficient documentation

## 2023-03-04 DIAGNOSIS — F410 Panic disorder [episodic paroxysmal anxiety] without agoraphobia: Secondary | ICD-10-CM

## 2023-03-04 DIAGNOSIS — N189 Chronic kidney disease, unspecified: Secondary | ICD-10-CM | POA: Insufficient documentation

## 2023-03-04 DIAGNOSIS — I129 Hypertensive chronic kidney disease with stage 1 through stage 4 chronic kidney disease, or unspecified chronic kidney disease: Secondary | ICD-10-CM | POA: Insufficient documentation

## 2023-03-04 LAB — COMPREHENSIVE METABOLIC PANEL
ALT: 38 U/L (ref 0–44)
AST: 41 U/L (ref 15–41)
Albumin: 4.1 g/dL (ref 3.5–5.0)
Alkaline Phosphatase: 62 U/L (ref 38–126)
Anion gap: 10 (ref 5–15)
BUN: 26 mg/dL — ABNORMAL HIGH (ref 8–23)
CO2: 22 mmol/L (ref 22–32)
Calcium: 9.2 mg/dL (ref 8.9–10.3)
Chloride: 104 mmol/L (ref 98–111)
Creatinine, Ser: 1.08 mg/dL — ABNORMAL HIGH (ref 0.44–1.00)
GFR, Estimated: 51 mL/min — ABNORMAL LOW (ref >60)
Glucose, Bld: 226 mg/dL — ABNORMAL HIGH (ref 70–99)
Potassium: 3.5 mmol/L (ref 3.5–5.1)
Sodium: 136 mmol/L (ref 135–145)
Total Bilirubin: 0.8 mg/dL (ref 0.3–1.2)
Total Protein: 7.1 g/dL (ref 6.5–8.1)

## 2023-03-04 LAB — CBC WITH DIFFERENTIAL/PLATELET
Abs Immature Granulocytes: 0.03 10*3/uL (ref 0.00–0.07)
Basophils Absolute: 0 10*3/uL (ref 0.0–0.1)
Basophils Relative: 0 %
Eosinophils Absolute: 0.1 10*3/uL (ref 0.0–0.5)
Eosinophils Relative: 1 %
HCT: 46.2 % — ABNORMAL HIGH (ref 36.0–46.0)
Hemoglobin: 14.9 g/dL (ref 12.0–15.0)
Immature Granulocytes: 0 %
Lymphocytes Relative: 14 %
Lymphs Abs: 1.4 10*3/uL (ref 0.7–4.0)
MCH: 29.5 pg (ref 26.0–34.0)
MCHC: 32.3 g/dL (ref 30.0–36.0)
MCV: 91.5 fL (ref 80.0–100.0)
Monocytes Absolute: 0.7 10*3/uL (ref 0.1–1.0)
Monocytes Relative: 7 %
Neutro Abs: 8.1 10*3/uL — ABNORMAL HIGH (ref 1.7–7.7)
Neutrophils Relative %: 78 %
Platelets: 343 10*3/uL (ref 150–400)
RBC: 5.05 MIL/uL (ref 3.87–5.11)
RDW: 13.8 % (ref 11.5–15.5)
WBC: 10.4 10*3/uL (ref 4.0–10.5)
nRBC: 0 % (ref 0.0–0.2)

## 2023-03-04 MED ORDER — HYDROXYZINE HCL 10 MG PO TABS: 10.0000 mg | ORAL_TABLET | Freq: Three times a day (TID) | ORAL | 0 refills | Status: DC | PRN

## 2023-03-04 NOTE — ED Notes (Signed)
See triage note, pt reports keeps having panic attacks and unable to sleep for the past 3 days. States feels fine currently. RR even and unlabored. NAD noted.

## 2023-03-04 NOTE — ED Provider Notes (Signed)
[facility] Provider Note    Event Date/Time   First MD Initiated Contact with Patient 03/04/23 1508     (approximate)   History   Chief Complaint Panic Attack   HPI  Jenna Powers is a 82 y.o. female with past medical history of hypertension, stroke, CKD, and anxiety who presents to the ED complaining of a panic attack.  Patient reports that she has dealt with frequent anxiety and had a panic attack earlier today.  She states she like her heart was racing and she could not catch her breath, describes episode as similar to prior panic attacks.  She was recently started on buspirone by her PCP for anxiety, has been taking this for 2 weeks without significant relief.  She denies any depression, suicidal ideation, or homicidal ideation.  She states that all of her symptoms have resolved and she is now feeling back to normal, her current primary concern is that she has been unable to sleep.  She states she has not slept at all for the past couple of nights and is requesting something to help her with sleep.  She states she already takes melatonin with no relief, denies alcohol or drug use.     Physical Exam   Triage Vital Signs: ED Triage Vitals  Encounter Vitals Group     BP 03/04/23 1406 (!) 140/77     Systolic BP Percentile --      Diastolic BP Percentile --      Pulse Rate 03/04/23 1406 (!) 125     Resp 03/04/23 1406 16     Temp 03/04/23 1406 98.5 F (36.9 C)     Temp Source 03/04/23 1406 Oral     SpO2 03/04/23 1406 95 %     Weight 03/04/23 1404 150 lb 12.7 oz (68.4 kg)     Height 03/04/23 1404 5\' 3"  (1.6 m)     Head Circumference --      Peak Flow --      Pain Score 03/04/23 1404 0     Pain Loc --      Pain Education --      Exclude from Growth Chart --     Most recent vital signs: Vitals:   03/04/23 1406  BP: (!) 140/77  Pulse: (!) 125  Resp: 16  Temp: 98.5 F (36.9 C)  SpO2: 95%    Constitutional: Alert and oriented. Eyes:  Conjunctivae are normal. Head: Atraumatic. Nose: No congestion/rhinnorhea. Mouth/Throat: Mucous membranes are moist.  Cardiovascular: Normal rate, regular rhythm. Grossly normal heart sounds.  2+ radial pulses bilaterally. Respiratory: Normal respiratory effort.  No retractions. Lungs CTAB. Gastrointestinal: Soft and nontender. No distention. Musculoskeletal: No lower extremity tenderness nor edema.  Neurologic:  Normal speech and language. No gross focal neurologic deficits are appreciated.    ED Results / Procedures / Treatments   Labs (all labs ordered are listed, but only abnormal results are displayed) Labs Reviewed  CBC WITH DIFFERENTIAL/PLATELET - Abnormal; Notable for the following components:      Result Value   HCT 46.2 (*)    Neutro Abs 8.1 (*)    All other components within normal limits  COMPREHENSIVE METABOLIC PANEL - Abnormal; Notable for the following components:   Glucose, Bld 226 (*)    BUN 26 (*)    Creatinine, Ser 1.08 (*)    GFR, Estimated 51 (*)    All other components within normal limits     EKG  ED ECG  REPORT I, Chesley Noon, the attending physician, personally viewed and interpreted this ECG.   Date: 03/04/2023  EKG Time: 14:23  Rate: 90  Rhythm: normal sinus rhythm, PAC's noted  Axis: Normal  Intervals:none  ST&T Change: None  PROCEDURES:  Critical Care performed: No  Procedures   MEDICATIONS ORDERED IN ED: Medications - No data to display   IMPRESSION / MDM / ASSESSMENT AND PLAN / ED COURSE  I reviewed the triage vital signs and the nursing notes.                              82 y.o. female with past medical history of hypertension, stroke, CKD, and anxiety who presents to the ED complaining of panic attack earlier today along with multiple days of insomnia.  Patient's presentation is most consistent with acute complicated illness / injury requiring diagnostic workup.  Differential diagnosis includes, but is not limited to,  anemia, electrolyte abnormality, AKI, anxiety, depression, psychosis.  Patient nontoxic-appearing and in no acute distress, initial vital signs remarkable for tachycardia however this resolved without intervention and EKG shows normal sinus rhythm with PACs, no ischemic changes noted.  Labs are reassuring with no significant anemia, leukocytosis, tract abnormality, or AKI.  Patient denies any suicidal or homicidal ideation, is not interested in psychiatric evaluation but is insistent that she get medication for sleep.  She states she has already tried melatonin and trazodone, we will trial low-dose Atarax and she was instructed to only take this to help her with sleep.  She was encouraged to follow-up with her PCP and to return to the ED for new or worsening symptoms, patient agrees with plan.      FINAL CLINICAL IMPRESSION(S) / ED DIAGNOSES   Final diagnoses:  Panic attack  Insomnia, unspecified type     Rx / DC Orders   ED Discharge Orders          Ordered    hydrOXYzine (ATARAX) 10 MG tablet  Every 8 hours PRN        03/04/23 1602             Note:  This document was prepared using Dragon voice recognition software and may include unintentional dictation errors.   Chesley Noon, MD 03/04/23 [phone]

## 2023-03-04 NOTE — ED Triage Notes (Signed)
States unable to sleep x 4 days.  Patient taking meds for anxiety, but patient states "It is not helping".  Dr. Quillian Quince just started on new medication recently, not helping patient.  Patient arrives anxious.  Skin warm and dry. NAD.  Skin warm and dry.  Denies current SI/HI.  Patient states "I would never do anything to hurt myself.  I have two boys who are precious".  Recent admission to ?[facility].  Had been given ambien during stay that helped her sleep.

## 2023-03-04 NOTE — ED Notes (Signed)
Pt asking pt next to her to stop talking and calm down because it is working her nerves

## 2023-03-21 ENCOUNTER — Emergency Department
Admission: EM | Admit: 2023-03-21 | Discharge: 2023-03-22 | Disposition: A | Discharge status: Home / Self Care | Payer: Medicare Other | Attending: Emergency Medicine | Admitting: Emergency Medicine

## 2023-03-21 DIAGNOSIS — Z8673 Personal history of transient ischemic attack (TIA), and cerebral infarction without residual deficits: Secondary | ICD-10-CM | POA: Diagnosis not present

## 2023-03-21 DIAGNOSIS — F419 Anxiety disorder, unspecified: Secondary | ICD-10-CM | POA: Insufficient documentation

## 2023-03-21 DIAGNOSIS — E039 Hypothyroidism, unspecified: Secondary | ICD-10-CM | POA: Diagnosis not present

## 2023-03-21 DIAGNOSIS — N189 Chronic kidney disease, unspecified: Secondary | ICD-10-CM | POA: Insufficient documentation

## 2023-03-21 DIAGNOSIS — R45851 Suicidal ideations: Secondary | ICD-10-CM | POA: Diagnosis not present

## 2023-03-21 DIAGNOSIS — Z79899 Other long term (current) drug therapy: Secondary | ICD-10-CM | POA: Diagnosis not present

## 2023-03-21 DIAGNOSIS — G4700 Insomnia, unspecified: Secondary | ICD-10-CM | POA: Diagnosis present

## 2023-03-21 DIAGNOSIS — F32A Depression, unspecified: Secondary | ICD-10-CM

## 2023-03-21 DIAGNOSIS — F339 Major depressive disorder, recurrent, unspecified: Secondary | ICD-10-CM | POA: Insufficient documentation

## 2023-03-21 DIAGNOSIS — F331 Major depressive disorder, recurrent, moderate: Secondary | ICD-10-CM | POA: Diagnosis not present

## 2023-03-21 DIAGNOSIS — F410 Panic disorder [episodic paroxysmal anxiety] without agoraphobia: Secondary | ICD-10-CM | POA: Insufficient documentation

## 2023-03-21 DIAGNOSIS — I129 Hypertensive chronic kidney disease with stage 1 through stage 4 chronic kidney disease, or unspecified chronic kidney disease: Secondary | ICD-10-CM | POA: Insufficient documentation

## 2023-03-21 LAB — COMPREHENSIVE METABOLIC PANEL
ALT: 41 U/L (ref 0–44)
AST: 43 U/L — ABNORMAL HIGH (ref 15–41)
Albumin: 4.3 g/dL (ref 3.5–5.0)
Alkaline Phosphatase: 94 U/L (ref 38–126)
Anion gap: 12 (ref 5–15)
BUN: 17 mg/dL (ref 8–23)
CO2: 21 mmol/L — ABNORMAL LOW (ref 22–32)
Calcium: 9.5 mg/dL (ref 8.9–10.3)
Chloride: 102 mmol/L (ref 98–111)
Creatinine, Ser: 1.35 mg/dL — ABNORMAL HIGH (ref 0.44–1.00)
GFR, Estimated: 39 mL/min — ABNORMAL LOW (ref >60)
Glucose, Bld: 129 mg/dL — ABNORMAL HIGH (ref 70–99)
Potassium: 4.1 mmol/L (ref 3.5–5.1)
Sodium: 135 mmol/L (ref 135–145)
Total Bilirubin: 0.7 mg/dL (ref 0.3–1.2)
Total Protein: 7.4 g/dL (ref 6.5–8.1)

## 2023-03-21 LAB — CBC
HCT: 45.7 % (ref 36.0–46.0)
Hemoglobin: 15 g/dL (ref 12.0–15.0)
MCH: 29.8 pg (ref 26.0–34.0)
MCHC: 32.8 g/dL (ref 30.0–36.0)
MCV: 90.9 fL (ref 80.0–100.0)
Platelets: 369 10*3/uL (ref 150–400)
RBC: 5.03 MIL/uL (ref 3.87–5.11)
RDW: 14.4 % (ref 11.5–15.5)
WBC: 10.6 10*3/uL — ABNORMAL HIGH (ref 4.0–10.5)
nRBC: 0 % (ref 0.0–0.2)

## 2023-03-21 LAB — ETHANOL: Alcohol, Ethyl (B): <10 mg/dL (ref <10)

## 2023-03-21 LAB — SALICYLATE LEVEL: Salicylate Lvl: <7 mg/dL — ABNORMAL LOW (ref 7.0–30.0)

## 2023-03-21 LAB — ACETAMINOPHEN LEVEL: Acetaminophen (Tylenol), Serum: <10 ug/mL — ABNORMAL LOW (ref 10–30)

## 2023-03-21 MED ORDER — TRAZODONE HCL 50 MG PO TABS
50.0000 mg | ORAL_TABLET | Freq: Once | ORAL | Status: AC
  Administered 2023-03-21: 50 mg via ORAL
  Filled 2023-03-21: qty 1

## 2023-03-21 MED ORDER — HYDROXYZINE HCL 10 MG PO TABS: 10.0000 mg | ORAL_TABLET | Freq: Three times a day (TID) | ORAL | 0 refills | Status: AC | PRN

## 2023-03-21 MED ORDER — LORAZEPAM 0.5 MG PO TABS
0.5000 mg | ORAL_TABLET | Freq: Once | ORAL | Status: AC
  Administered 2023-03-21: 0.5 mg via ORAL
  Filled 2023-03-21: qty 1

## 2023-03-21 NOTE — ED Triage Notes (Addendum)
First nurse note: Pt to ED [facility] from doctors office for SI. Hx recent panic attacks. Running out of anxiety meds. Stated would cut her wrists.

## 2023-03-21 NOTE — ED Provider Notes (Signed)
[facility] Provider Note    Event Date/Time   First MD Initiated Contact with Patient 03/21/23 1806     (approximate)   History   Suicidal   HPI  Jenna Powers is a 82 y.o. female presents to the emergency department today because of concerns for anxiety and voicing suicidal thoughts.  The patient however is states to myself that she would never hurt herself.  She states that she was just upset because she has bad anxiety and her primary care doctor would not order her anxiety medication.  She says that she has sons that she would not want to hurt by hurting herself.     Physical Exam   Triage Vital Signs: ED Triage Vitals  Encounter Vitals Group     BP 03/21/23 1743 115/71     Systolic BP Percentile --      Diastolic BP Percentile --      Pulse Rate 03/21/23 1743 72     Resp 03/21/23 1743 18     Temp 03/21/23 1743 98.2 F (36.8 C)     Temp Source 03/21/23 1743 Oral     SpO2 03/21/23 1743 94 %     Weight 03/21/23 1739 130 lb (59 kg)     Height 03/21/23 1739 5\' 3"  (1.6 m)     Head Circumference --      Peak Flow --      Pain Score 03/21/23 1739 0     Pain Loc --      Pain Education --      Exclude from Growth Chart --     Most recent vital signs: Vitals:   03/21/23 1743  BP: 115/71  Pulse: 72  Resp: 18  Temp: 98.2 F (36.8 C)  SpO2: 94%   General: Awake, alert, anxious. CV:  Good peripheral perfusion.  Resp:  Normal effort.  Abd:  No distention.    ED Results / Procedures / Treatments   Labs (all labs ordered are listed, but only abnormal results are displayed) Labs Reviewed  COMPREHENSIVE METABOLIC PANEL - Abnormal; Notable for the following components:      Result Value   CO2 21 (*)    Glucose, Bld 129 (*)    Creatinine, Ser 1.35 (*)    AST 43 (*)    GFR, Estimated 39 (*)    All other components within normal limits  SALICYLATE LEVEL - Abnormal; Notable for the following components:   Salicylate Lvl <7.0 (*)     All other components within normal limits  ACETAMINOPHEN LEVEL - Abnormal; Notable for the following components:   Acetaminophen (Tylenol), Serum <10 (*)    All other components within normal limits  CBC - Abnormal; Notable for the following components:   WBC 10.6 (*)    All other components within normal limits  ETHANOL  URINE DRUG SCREEN, QUALITATIVE ([facility] ONLY)     EKG  None   RADIOLOGY None  PROCEDURES:  Critical Care performed: No  MEDICATIONS ORDERED IN ED: Medications - No data to display   IMPRESSION / MDM / ASSESSMENT AND PLAN / ED COURSE  I reviewed the triage vital signs and the nursing notes.                              Differential diagnosis includes, but is not limited to, depression, anxiety, si  Patient's presentation is most consistent with acute presentation with potential  threat to life or bodily function.  Patient presented to the emergency department today because of concerns for possible SI.  On my exam patient denies that she would actually try to hurt herself.  Does state that she has anxiety and was upset that she has not been given her anxiety medication.  Will have psychiatry evaluate.  Psychiatry evaluated patient.  At this time they do not feel patient necessitates inpatient admission.  Will discharge patient with prescription for Atarax to help with anxiety.  FINAL CLINICAL IMPRESSION(S) / ED DIAGNOSES   Final diagnoses:  Depression, unspecified depression type  Anxiety     Note:  This document was prepared using Dragon voice recognition software and may include unintentional dictation errors.    Phineas Semen, MD 03/22/23 [id number]

## 2023-03-21 NOTE — Consult Note (Signed)
Telepsych Consultation   Reason for Consult:  Psych evaluation  Referring Physician:  Dr. Derrill Kay Location of Patient: [facility] ER Location of Provider: Other: Remote office  Patient Identification: Jenna Powers MRN:  [id number] Principal Diagnosis: Moderate episode of recurrent major depressive disorder (HCC) Diagnosis:  Principal Problem:   Moderate episode of recurrent major depressive disorder (HCC) Active Problems:   Insomnia   Suicidal thoughts   Total Time spent with patient: 30 minutes  Subjective:   Jenna Powers is a 82 y.o. female patient admitted with panic attacks and SI.  HPI:  Tele psych Assessment  Jenna Powers, 82 y.o., female patient seen via tele health by TTS and this provider; chart reviewed and consulted with Dr. Derrill Kay on 03/21/23.  On evaluation Jenna Powers reports that she has been having difficulty sleeping and have been having panic attacks.  Per chart review, patient has been seen at this ER on 01/11/2023 and 02/22/2023 for similar presentation.  On both occasion she was discharged from the er after patient received something for her anxiety. And on both occasions patient stated that she only says she has SI so that she can see a doctor faster.  She see a PCP and says they took her off the medication that was helping with her anxiety and sleep.  She says her pcp recommended that she followed-up with a psychiatrist. Initially during our assessment, patient was reluctant to follow-up with a psychiatrist because she says, "I'm not crazy".    Per chart review, Psych reached out to her collateral, Jenna Powers @[phone], who collaborates she cares for patient one day weekly.  She reports she's heard patient make passive suicidal comments in the past, but has not voiced a plan or intent to hurt herself or end her life.  She also reports patient does not own a firearm and she has not seen her engage in self harming activities. She also reports patient's  suicidal ideations directly correlate with sleep problems or dealing with anxiety. She also reports she does not think it's a good idea for pt to be left alone d/t mobility problems and problems getting around to care for herself. She reports pt's son is in the process of getting her into rehab but she has not received any updates.  She states if pt was discharged home today, her son, [name] should be able to pick her up but if he cannot, she will try to make arrangements to get her.   Currently, She is adamant that she will not kill herself because she has two sons that loves her.  Per chart review, patient presented to the er on 02/22/2023 and 01/11/2023 with similar presentation and threat to slit her wrist.    During evaluation Jenna Powers is sitting on her bed. She is alert/oriented x 4; she is calm/cooperative; and mood congruent with affect.  She is  wearing hospital scrubs.  Patient is speaking in a clear tone at moderate volume, and normal pace; with good eye contact.  Her thought process is coherent and relevant; There is no indication that she is currently responding to internal/external stimuli or experiencing delusional thought content.  Patient denies suicidal/self-harm/homicidal ideation, psychosis, and paranoia.  Patient has remained calm throughout assessment and has answered questions appropriately.    Recommendations: Patient contracts for safety and has been psychiatrically cleared  Dr. Derrill Kay informed of above recommendation and disposition  Past Psychiatric History: Panic attacks, Anxiety  Risk to Self:  no Risk to  Others:  no Prior Inpatient Therapy:  no Prior Outpatient Therapy:  no  Past Medical History:  Past Medical History:  Diagnosis Date   Anxiety    Chronic kidney disease    Depression    Hyperlipemia    Hypertension    Hypothyroidism    Neuropathy    Stroke (HCC)    Thyroid disease    Tremor     Past Surgical History:  Procedure Laterality Date    ABDOMINAL HYSTERECTOMY     ANKLE SURGERY Right    Family History:  Family History  Problem Relation Age of Onset   Pneumonia Mother    Hypertension Father    Heart disease Father    Stroke Father          Social History   Substance and Sexual Activity  Drug Use No      Additional Social History:    Allergies:   Allergies  Allergen Reactions   Codeine Other (See Comments)    Pt reports allergy but does not remember what it was    Labs:  Results for orders placed or performed during the hospital encounter of 03/21/23 (from the past 48 hour(s))  Comprehensive metabolic panel     Status: Abnormal   Collection Time: 03/21/23  5:47 PM  Result Value Ref Range   Sodium 135 135 - 145 mmol/L   Potassium 4.1 3.5 - 5.1 mmol/L   Chloride 102 98 - 111 mmol/L   CO2 21 (L) 22 - 32 mmol/L   Glucose, Bld 129 (H) 70 - 99 mg/dL    Comment: Glucose reference range applies only to samples taken after fasting for at least 8 hours.   BUN 17 8 - 23 mg/dL   Creatinine, Ser 1.30 (H) 0.44 - 1.00 mg/dL   Calcium 9.5 8.9 - 86.5 mg/dL   Total Protein 7.4 6.5 - 8.1 g/dL   Albumin 4.3 3.5 - 5.0 g/dL   AST 43 (H) 15 - 41 U/L   ALT 41 0 - 44 U/L   Alkaline Phosphatase 94 38 - 126 U/L   Total Bilirubin 0.7 0.3 - 1.2 mg/dL   GFR, Estimated 39 (L) >60 mL/min    Comment: (NOTE) Calculated using the CKD-EPI Creatinine Equation (2021)    Anion gap 12 5 - 15    Comment: Performed at [facility], [address] [place], [place] [address]  Ethanol     Status: None   Collection Time: 03/21/23  5:47 PM  Result Value Ref Range   Alcohol, Ethyl (B) <10 <10 mg/dL    Comment: (NOTE) Lowest detectable limit for serum alcohol is 10 mg/dL.  For medical purposes only. Performed at [facility], [address] [place], [place] [address]   Salicylate level     Status: Abnormal   Collection Time: 03/21/23  5:47 PM  Result Value Ref Range   Salicylate Lvl <7.0 (L) 7.0 - 30.0 mg/dL     Comment: Performed at [facility], [address] [place], [place] [address]  Acetaminophen level     Status: Abnormal   Collection Time: 03/21/23  5:47 PM  Result Value Ref Range   Acetaminophen (Tylenol), Serum <10 (L) 10 - 30 ug/mL    Comment: (NOTE) Therapeutic concentrations vary significantly. A range of 10-30 ug/mL  may be an effective concentration for many patients. However, some  are best treated at concentrations outside of this range. Acetaminophen concentrations >150 ug/mL at 4 hours after ingestion  and >50 ug/mL at 12 hours after ingestion are often associated with  toxic reactions.  Performed at [facility], [address] [place], [place] [address]   cbc     Status: Abnormal   Collection Time: 03/21/23  5:47 PM  Result Value Ref Range   WBC 10.6 (H) 4.0 - 10.5 K/uL   RBC 5.03 3.87 - 5.11 MIL/uL   Hemoglobin 15.0 12.0 - 15.0 g/dL   HCT 44.0 34.7 - 42.5 %   MCV 90.9 80.0 - 100.0 fL   MCH 29.8 26.0 - 34.0 pg   MCHC 32.8 30.0 - 36.0 g/dL   RDW 95.6 38.7 - 56.4 %   Platelets 369 150 - 400 K/uL   nRBC 0.0 0.0 - 0.2 %    Comment: Performed at [facility], [address] [place], [place] [address]    Medications:  No current facility-administered medications for this encounter.   Current Outpatient Medications  Medication Sig Dispense Refill   acetaminophen (TYLENOL) 325 MG tablet Take 650 mg by mouth every 6 (six) hours as needed for moderate pain.     amLODipine (NORVASC) 10 MG tablet Take 1 tablet (10 mg total) by mouth daily. 30 tablet 1   aspirin EC 81 MG EC tablet Take 1 tablet (81 mg total) by mouth daily. Swallow whole. (Patient not taking: Reported on 01/09/2023) 30 tablet 1   atorvastatin (LIPITOR) 80 MG tablet Take 1 tablet (80 mg total) by mouth daily. 30 tablet 1   carvedilol (COREG) 12.5 MG tablet Take 1 tablet (12.5 mg total) by mouth 2 (two) times daily with a meal. (Patient not taking: Reported on 01/09/2023) 60  tablet 1   Cholecalciferol (VITAMIN D3 PO) Take 1,000 Units by mouth daily.     FLUoxetine (PROZAC) 20 MG capsule Take 20 mg by mouth daily.     furosemide (LASIX) 20 MG tablet Take 20 mg by mouth daily. (Patient not taking: Reported on 02/22/2023)     HYDROcodone-acetaminophen (NORCO/VICODIN) 5-325 MG tablet Take 1 tablet by mouth every 12 (twelve) hours. (Patient not taking: Reported on 02/22/2023)     hydrOXYzine (ATARAX) 10 MG tablet Take 1 tablet (10 mg total) by mouth every 8 (eight) hours as needed for anxiety. 12 tablet 0   levothyroxine (SYNTHROID) 25 MCG tablet Take 25 mcg by mouth every morning.     losartan (COZAAR) 100 MG tablet Take 1 tablet (100 mg total) by mouth daily. 30 tablet 1   melatonin 3 MG TABS tablet Take 1 tablet (3 mg total) by mouth at bedtime as needed. 20 tablet 0   memantine (NAMENDA) 10 MG tablet Take 10 mg by mouth 2 (two) times daily.     Multiple Vitamin (MULTIVITAMIN WITH MINERALS) TABS tablet Take 1 tablet by mouth daily. (Patient not taking: Reported on 01/09/2023)     oxybutynin (DITROPAN-XL) 10 MG 24 hr tablet Take 10 mg by mouth daily.     QUEtiapine (SEROQUEL) 25 MG tablet Take 25 mg by mouth at bedtime. (Patient not taking: Reported on 02/22/2023)     sertraline (ZOLOFT) 100 MG tablet Take 100 mg by mouth daily. (Patient not taking: Reported on 01/09/2023)     Spacer/Aero-Holding Chambers (AEROCHAMBER PLUS) inhaler Use as instructed 1 each 2   traMADol (ULTRAM) 50 MG tablet Take 50 mg by mouth 3 (three) times daily as needed for moderate pain. (Patient not taking: Reported on 01/09/2023)     traZODone (DESYREL) 100 MG tablet Take 200 mg  by mouth at bedtime as needed for sleep. (Patient not taking: Reported on 01/09/2023)      Musculoskeletal: Strength & Muscle Tone: within normal limits Gait & Station: normal Patient leans: N/A  Psychiatric Specialty Exam:  Presentation  General Appearance:  Appropriate for Environment; Fairly Groomed  Eye  Contact: Good  Speech: Clear and Coherent; Normal Rate  Speech Volume: Normal  Handedness: Right   Mood and Affect  Mood: Euthymic  Affect: Appropriate; Congruent   Thought Process  Thought Processes: Coherent  Descriptions of Associations:Intact  Orientation:Full (Time, Place and Person)  Thought Content:Logical (has improved since admission and restarting seroquel for sleep)  History of Schizophrenia/Schizoaffective disorder:No  Duration of Psychotic Symptoms:No data recorded Hallucinations:No data recorded Ideas of Reference:None  Suicidal Thoughts:No data recorded Homicidal Thoughts:No data recorded  Sensorium  Memory: Immediate Fair; Recent Fair; Remote Fair  Judgment: Fair (can be impulsive when anxious but has proven record for reaching out for care when needed.)  Insight: Fair   Art therapist  Concentration: Good  Attention Span: Good  Recall: [employer] of Knowledge: Fair  Language: Good   Psychomotor Activity  Psychomotor Activity:No data recorded  Assets  Assets: Communication Skills; Financial Resources/Insurance; Housing; Social Support   Sleep  Sleep:Poor   Physical Exam: Physical Exam Vitals and nursing note reviewed.  HENT:     Head: Normocephalic and atraumatic.     Nose: Nose normal.     Mouth/Throat:     Mouth: Mucous membranes are dry.  Eyes:     Pupils: Pupils are equal, round, and reactive to light.  Pulmonary:     Effort: Pulmonary effort is normal.  Musculoskeletal:        General: Normal range of motion.     Cervical back: Normal range of motion.  Skin:    General: Skin is dry.  Neurological:     Mental Status: She is alert and oriented to person, place, and time.  Psychiatric:        Attention and Perception: Attention and perception normal.        Mood and Affect: Mood and affect normal.        Speech: Speech normal.        Behavior: Behavior is cooperative.        Thought Content:  Thought content is not delusional. Thought content does not include homicidal or suicidal ideation. Thought content does not include homicidal or suicidal plan.        Cognition and Memory: Cognition normal. Memory is impaired.        Judgment: Judgment normal.    Review of Systems  Psychiatric/Behavioral:  Positive for memory loss. Negative for depression, hallucinations, substance abuse and suicidal ideas. The patient is nervous/anxious and has insomnia.   All other systems reviewed and are negative.  Blood pressure 115/71, pulse 72, temperature 98.2 F (36.8 C), temperature source Oral, resp. rate 18, height 5\' 3"  (1.6 m), weight 59 kg, SpO2 94%. Body mass index is 23.03 kg/m.   Disposition: No evidence of imminent risk to self or others at present.   Patient does not meet criteria for psychiatric inpatient admission. Discussed crisis plan, support from social network, calling 911, coming to the Emergency Department, and calling Suicide Hotline. Patient is Psychiatrically clear  This service was provided via telemedicine using a 2-way, interactive audio and Immunologist.     Jearld Lesch, NP 03/21/2023 8:32 PM

## 2023-03-21 NOTE — Discharge Instructions (Signed)
Please seek medical attention for any high fevers, chest pain, shortness of breath, change in behavior, persistent vomiting, bloody stool or any other new or concerning symptoms.  

## 2023-03-21 NOTE — ED Notes (Signed)
Pt received dinner. 

## 2023-03-21 NOTE — ED Notes (Signed)
Pt given snack at this time  

## 2023-03-21 NOTE — ED Notes (Addendum)
Pt dressed out with both NT, pt was cooperative , pt belongings where placed and labeled belonging:   a pink cardigan , pink dress, burgundy flats , and black off the shoulder purse.  Cash in purse : 3 20s , 1.75 in quarters , 16 nickels, 7 dimes , 4 nickels. [employer] had license in bag and cash was placed back in bag by [place] NT. Also had a calendar within purse.

## 2023-03-21 NOTE — ED Triage Notes (Signed)
Pt presents to the ED via [facility] for SI. Pt states "my doctor sent me here because I have panic attacks and I told her I was going to kill myself. She wouldn't give me anything to help me sleep. Pt states that it's the panic attacks. Pt states that she has thought about cutting her wrists, but that she is too scared to do it. Pt states "I might actually do it when I get out of here this time". Pt states that she can not sleep at home. Pt states "you can't live without sleep".

## 2023-03-21 NOTE — ED Notes (Signed)
VOL/pending psych consult 

## 2023-03-22 MED ORDER — QUETIAPINE FUMARATE 25 MG PO TABS
25.0000 mg | ORAL_TABLET | Freq: Once | ORAL | Status: AC
  Administered 2023-03-22: 25 mg via ORAL
  Filled 2023-03-22: qty 1

## 2023-03-22 MED ORDER — MELATONIN 5 MG PO TABS
5.0000 mg | ORAL_TABLET | Freq: Once | ORAL | Status: AC
  Administered 2023-03-22: 5 mg via ORAL

## 2023-03-22 NOTE — ED Notes (Signed)
VOL/pending d/c 

## 2023-03-22 NOTE — BH Assessment (Signed)
Comprehensive Clinical Assessment (CCA) Note  03/22/2023 Jenna Powers [id number]  Chief Complaint: Patient is a 82 year old female presenting to [facility] ED voluntarily. Per triage note Pt presents to the ED via [facility] for SI. Pt states "my doctor sent me here because I have panic attacks and I told her I was going to kill myself. She wouldn't give me anything to help me sleep. Pt states that it's the panic attacks. Pt states that she has thought about cutting her wrists, but that she is too scared to do it. Pt states "I might actually do it when I get out of here this time". Pt states that she can not sleep at home. Pt states "you can't live without sleep". During assessment patient appears alert and oriented x4, calm and cooperative. Patient continues report that she is seeking medication for her "panic attacks." Patient reports that she only expressed SI to "get seen by a doctor", she reports that her primary care doctor will not give her any medication for her panic attacks. Patient denies any attempts to hurt herself and denies wanting to hurt herself currently. Patient denies HI/AH/VH.   Chief Complaint  Patient presents with   Suicidal   Visit Diagnosis: Depression    CCA Screening, Triage and Referral (STR)  Patient Reported Information How did you hear about [place]? Self  Referral name: No data recorded Referral phone number: No data recorded  Whom do you see for routine medical problems? No data recorded Practice/Facility Name: No data recorded Practice/Facility Phone Number: No data recorded Name of Contact: No data recorded Contact Number: No data recorded Contact Fax Number: No data recorded Prescriber Name: No data recorded Prescriber Address (if known): No data recorded  What Is the Reason for Your Visit/Call Today? First nurse note: Pt to ED [facility] from doctors office for SI. Hx recent panic attacks. Running out of anxiety meds. Stated would cut her wrists.  How Long Has  This Been Causing You Problems? > than 6 months  What Do You Feel Would Help You the Most Today? Treatment for Depression or other mood problem   Have You Recently Been in Any Inpatient Treatment (Hospital/Detox/Crisis Center/28-Day Program)? No data recorded Name/Location of Program/Hospital:No data recorded How Long Were You There? No data recorded When Were You Discharged? No data recorded  Have You Ever Received Services From [facility] Before? No data recorded Who Do You See at [facility]? No data recorded  Have You Recently Had Any Thoughts About Hurting Yourself? Yes  Are You Planning to Commit Suicide/Harm Yourself At This time? No   Have you Recently Had Thoughts About Hurting Someone [name]? No  Explanation: No data recorded  Have You Used Any Alcohol or Drugs in the Past 24 Hours? No  How Long Ago Did You Use Drugs or Alcohol? No data recorded What Did You Use and How Much? No data recorded  Do You Currently Have a Therapist/Psychiatrist? No  Name of Therapist/Psychiatrist: No data recorded  Have You Been Recently Discharged From Any Office Practice or Programs? No  Explanation of Discharge From Practice/Program: No data recorded    CCA Screening Triage Referral Assessment Type of Contact: Face-to-Face  Is this Initial or Reassessment? No data recorded Date Telepsych consult ordered in [facility]:  No data recorded Time Telepsych consult ordered in [facility]:  No data recorded  Patient Reported Information Reviewed? No data recorded Patient Left Without Being Seen? No data recorded Reason for Not Completing Assessment: No data  recorded  Collateral Involvement: [name]- Nurse Aid [phone]   Does Patient Have a Court Appointed Legal Guardian? No data recorded Name and Contact of Legal Guardian: No data recorded If Minor and Not Living with Parent(s), Who has Custody? No data recorded Is CPS involved or ever been involved? Never  Is APS involved or ever been  involved? Never   Patient Determined To Be At Risk for Harm To Self or Others Based on Review of Patient Reported Information or Presenting Complaint? No  Method: No Plan  Availability of Means: No access or NA  Intent: Vague intent or NA  Notification Required: No need or identified person  Additional Information for Danger to Others Potential: No data recorded Additional Comments for Danger to Others Potential: No data recorded Are There Guns or Other Weapons in Your Home? No  Types of Guns/Weapons: No data recorded Are These Weapons Safely Secured?                            No data recorded Who Could Verify You Are Able To Have These Secured: No data recorded Do You Have any Outstanding Charges, Pending Court Dates, Parole/Probation? No data recorded Contacted To Inform of Risk of Harm To Self or Others: No data recorded  Location of Assessment: [facility]   Does Patient Present under Involuntary Commitment? No  IVC Papers Initial File Date: No data recorded  [place] of Residence: [place]   Patient Currently Receiving the Following Services: Not Receiving Services   Determination of Need: Emergent (2 hours)   Options For Referral: ED Visit     CCA Biopsychosocial Intake/Chief Complaint:  No data recorded Current Symptoms/Problems: No data recorded  Patient Reported Schizophrenia/Schizoaffective Diagnosis in Past: No   Strengths: Have a support system, stable housing and able to care of herself.  Preferences: No data recorded Abilities: No data recorded  Type of Services Patient Feels are Needed: No data recorded  Initial Clinical Notes/Concerns: No data recorded  Mental Health Symptoms Depression:   Change in energy/activity; Difficulty Concentrating; Sleep (too much or little)   Duration of Depressive symptoms:  Greater than two weeks   Mania:   Change in energy/activity   Anxiety:    Worrying; Tension; Sleep; Restlessness; Difficulty  concentrating; Fatigue; Irritability   Psychosis:   None   Duration of Psychotic symptoms: No data recorded  Trauma:   N/A   Obsessions:   N/A   Compulsions:   N/A   Inattention:   N/A   Hyperactivity/Impulsivity:   N/A   Oppositional/Defiant Behaviors:   N/A   Emotional Irregularity:   N/A   Other Mood/Personality Symptoms:  No data recorded   Mental Status Exam Appearance and self-care  Stature:   Average   Weight:   Average weight   Clothing:   Neat/clean; Age-appropriate   Grooming:   Normal   Cosmetic use:   None   Posture/gait:   Normal   Motor activity:   -- (UTA-Patient was sitting.)   Sensorium  Attention:   Normal   Concentration:   Normal   Orientation:   X5   Recall/memory:   Normal   Affect and Mood  Affect:   Appropriate   Mood:   Hopeless; Depressed; Anxious   Relating  Eye contact:   Normal   Facial expression:   Anxious; Responsive; Sad   Attitude toward examiner:   Cooperative   Thought and Language  Speech flow:  Clear and Coherent; Normal   Thought content:   Appropriate to Mood and Circumstances   Preoccupation:   None   Hallucinations:   None   Organization:  No data recorded  [employer] of Knowledge:   Average   Intelligence:   Average   Abstraction:   Normal   Judgement:   Fair   Dance movement psychotherapist:   Adequate   Insight:   Fair   Decision Making:   Normal   Social Functioning  Social Maturity:   Responsible   Social Judgement:   Normal   Stress  Stressors:   -- (Patient is unable to sleep.)   Coping Ability:   Normal   Skill Deficits:   None   Supports:   Family; Friends/Service system     Religion: Religion/Spirituality Are You A Religious Person?: Yes  Leisure/Recreation: Leisure / Recreation Do You Have Hobbies?: No  Exercise/Diet: Exercise/Diet Do You Exercise?: No Have You Gained or Lost A Significant Amount of Weight in the  Past Six Months?: No Do You Follow a Special Diet?: No Do You Have Any Trouble Sleeping?: Yes Explanation of Sleeping Difficulties: Having trouble falling asleep.   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  Childhood History:  Childhood History By whom was/is the patient raised?: Mother Did patient suffer any verbal/emotional/physical/sexual abuse as a child?: No Did patient suffer from severe childhood neglect?: No Has patient ever been sexually abused/assaulted/raped as an adolescent or adult?: No Was the patient ever a victim of a crime or a disaster?: No Witnessed domestic violence?: No Has patient been affected by domestic violence as an adult?: No  Child/Adolescent Assessment:     CCA Substance Use Alcohol/Drug Use: Alcohol / Drug Use Pain Medications: See PTA Prescriptions: See PTA Over the Counter: See PTA History of alcohol / drug use?: No history of alcohol / drug abuse Longest period of sobriety (when/how long): n/a                         ASAM's:  Six Dimensions of Multidimensional Assessment  Dimension 1:  Acute Intoxication and/or Withdrawal Potential:      Dimension 2:  Biomedical Conditions and Complications:      Dimension 3:  Emotional, Behavioral, or Cognitive Conditions and  Complications:     Dimension 4:  Readiness to Change:     Dimension 5:  Relapse, Continued use, or Continued Problem Potential:     Dimension 6:  Recovery/Living Environment:     ASAM Severity Score:    ASAM Recommended Level of Treatment:     Substance use Disorder (SUD)    Recommendations for Services/Supports/Treatments:    DSM5 Diagnoses: Patient Active Problem List   Diagnosis Date Noted   Panic attacks 03/21/2023   Adjustment disorder with mixed anxiety and depressed mood 02/22/2023   Moderate episode of recurrent major depressive disorder (HCC) 02/22/2023   Insomnia 01/10/2023  Recurrent major depression-severe (HCC) 01/10/2023   Suicidal thoughts 01/10/2023   Acute delirium 06/21/2021   TIA (transient ischemic attack)    Weakness of right upper extremity 06/15/2021   Cerebral infarction (HCC) 06/15/2021   Right hand weakness 06/14/2021   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) 03/13/2021   Rhabdomyolysis 03/13/2021   Fall at home, initial encounter 03/13/2021   Leukocytosis 03/13/2021   Elevated troponin 03/13/2021   Intraparenchymal hemorrhage of brain (HCC) 03/13/2021   Acute right hemiparesis (HCC) 03/13/2021   Syncope 09/10/2019   Other specified hypothyroidism 04/22/2019   Essential hypertension 12/23/2018   Anxiety 12/23/2018   History of depression 12/23/2018    Patient Centered Plan: Patient is on the following Treatment Plan(s):  Anxiety and Depression   Referrals to Alternative Service(s): Referred to Alternative Service(s):   Place:   Date:   Time:    Referred to Alternative Service(s):   Place:   Date:   Time:    Referred to Alternative Service(s):   Place:   Date:   Time:    Referred to Alternative Service(s):   Place:   Date:   Time:      @[facility] @  [employer], LCAS-A

## 2023-03-29 ENCOUNTER — Encounter: Payer: Self-pay | Admitting: Emergency Medicine

## 2023-03-29 ENCOUNTER — Ambulatory Visit
Admission: EM | Admit: 2023-03-29 | Discharge: 2023-03-29 | Disposition: A | Discharge status: Home / Self Care | Payer: Medicare Other | Source: Home / Self Care

## 2023-03-29 DIAGNOSIS — N764 Abscess of vulva: Secondary | ICD-10-CM

## 2023-03-29 MED ORDER — DOXYCYCLINE HYCLATE 100 MG PO CAPS: 100.0000 mg | ORAL_CAPSULE | Freq: Two times a day (BID) | ORAL | 0 refills | Status: AC

## 2023-03-29 NOTE — ED Provider Notes (Signed)
[facility]    CSN: [id number] Arrival date & time: 03/29/23  1354      History   Chief Complaint Chief Complaint  Patient presents with   Abscess    HPI Jenna Powers is a 82 y.o. female.   HPI  82 year old female with a past medical history significant for hypertension, hypothyroidism, hyperlipidemia, chronic kidney disease, thyroid disease, stroke, and neuropathy presents for evaluation of a right labial abscess.  She first noticed that 6 days ago and states that it started like a pimple but is continued to grow.  She did squeeze it and was able to express some yellow and bloody pus.  She states that she does wear diapers due to urinary incontinence.  She denies fever.  Past Medical History:  Diagnosis Date   Anxiety    Chronic kidney disease    Depression    Hyperlipemia    Hypertension    Hypothyroidism    Neuropathy    Stroke [facility])    Thyroid disease    Tremor     Patient Active Problem List   Diagnosis Date Noted   Panic attacks 03/21/2023   Adjustment disorder with mixed anxiety and depressed mood 02/22/2023   Moderate episode of recurrent major depressive disorder (HCC) 02/22/2023   Insomnia 01/10/2023   Recurrent major depression-severe (HCC) 01/10/2023   Suicidal thoughts 01/10/2023   Acute delirium 06/21/2021   TIA (transient ischemic attack)    Weakness of right upper extremity 06/15/2021   Cerebral infarction (HCC) 06/15/2021   Right hand weakness 06/14/2021   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      Home Medications    Prior to Admission medications   Medication Sig Start Date End Date Taking? Authorizing Provider  doxycycline (VIBRAMYCIN) 100 MG capsule Take 1 capsule (100 mg total) by mouth 2 (two) times daily. 03/29/23  Yes Becky Augusta, NP  acetaminophen (TYLENOL) 325 MG tablet Take 650 mg by mouth every 6 (six) hours as needed for moderate pain.    [provider]  amLODipine (NORVASC) 10 MG tablet Take 1 tablet (10 mg total) by mouth daily. 06/23/21   Burnadette Pop, MD  aspirin EC 81 MG EC tablet Take 1 tablet (81 mg total) by mouth daily. Swallow whole. Patient not taking: Reported on 01/09/2023 06/23/21   Burnadette Pop, MD  atorvastatin (LIPITOR) 80 MG tablet Take 1 tablet (80 mg total) by mouth daily. 06/23/21   Burnadette Pop, MD  carvedilol (COREG) 12.5 MG tablet Take 1 tablet (12.5 mg total) by mouth 2 (two) times daily with a meal. Patient not taking: Reported on 01/09/2023 06/22/21   Burnadette Pop, MD  Cholecalciferol (VITAMIN D3 PO) Take 1,000 Units by mouth daily.    [provider]  FLUoxetine (PROZAC) 20 MG capsule Take 20 mg by mouth daily.    [provider]  furosemide (LASIX) 20 MG tablet Take 20 mg by  mouth daily. Patient not taking: Reported on 02/22/2023    [provider]  HYDROcodone-acetaminophen (NORCO/VICODIN) 5-325 MG tablet Take 1 tablet by mouth every 12 (twelve) hours. Patient not taking: Reported on 02/22/2023 12/21/22   [provider]  hydrOXYzine (ATARAX) 10 MG tablet Take 1 tablet (10 mg total) by mouth every 8 (eight) hours as needed for anxiety. 03/21/23   Phineas Semen, MD  levothyroxine (SYNTHROID) 25 MCG tablet Take 25 mcg by mouth every morning. 07/31/19   [provider]  losartan (COZAAR) 100 MG tablet Take 1 tablet (100 mg total) by mouth daily. 06/23/21    Burnadette Pop, MD  melatonin 3 MG TABS tablet Take 1 tablet (3 mg total) by mouth at bedtime as needed. 02/18/23   Jene Every, MD  memantine (NAMENDA) 10 MG tablet Take 10 mg by mouth 2 (two) times daily. 01/13/21   [provider]  Multiple Vitamin (MULTIVITAMIN WITH MINERALS) TABS tablet Take 1 tablet by mouth daily. Patient not taking: Reported on 01/09/2023    [provider]  oxybutynin (DITROPAN-XL) 10 MG 24 hr tablet Take 10 mg by mouth daily.    [provider]  QUEtiapine (SEROQUEL) 25 MG tablet Take 25 mg by mouth at bedtime. Patient not taking: Reported on 02/22/2023 12/17/22   [provider]  sertraline (ZOLOFT) 100 MG tablet Take 100 mg by mouth daily. Patient not taking: Reported on 01/09/2023 08/27/19   [provider]  Spacer/Aero-Holding Chambers (AEROCHAMBER PLUS) inhaler Use as instructed 10/28/19   Domenick Gong, MD  traMADol (ULTRAM) 50 MG tablet Take 50 mg by mouth 3 (three) times daily as needed for moderate pain. Patient not taking: Reported on 01/09/2023 02/07/21   [provider]  traZODone (DESYREL) 100 MG tablet Take 200 mg by mouth at bedtime as needed for sleep. Patient not taking: Reported on 01/09/2023 01/17/21   [provider]    Family History Family History  Problem Relation Age of Onset   Pneumonia Mother    Hypertension Father    Heart disease Father    Stroke Father     Social History Social History   Tobacco Use   Smoking status: Former    Current packs/day: 0.00    Types: Cigarettes    Quit date: 07/2019    Years since quitting: 3.6   Smokeless tobacco: Never  Vaping Use   Vaping status: Never Used  Substance Use Topics   Alcohol use: Not Currently   Drug use: No     Allergies   Codeine   Review of Systems Review of Systems  Constitutional:  Negative for fever.  Skin:  Positive for color change and wound.     Physical Exam Triage Vital Signs ED Triage Vitals   Encounter Vitals Group     BP      Systolic BP Percentile      Diastolic BP Percentile      Pulse      Resp      Temp      Temp src      SpO2      Weight      Height      Head Circumference      Peak Flow      Pain Score      Pain Loc      Pain Education      Exclude from Growth Chart    No data found.  Updated Vital Signs BP 124/85 (BP Location: Right  Arm)   Pulse 85   Temp 98.4 F (36.9 C) (Oral)   Resp 14   Ht 5\' 3"  (1.6 m)   Wt 130 lb 1.1 oz (59 kg)   SpO2 96%   BMI 23.04 kg/m   Visual Acuity Right Eye Distance:   Left Eye Distance:   Bilateral Distance:    Right Eye Near:   Left Eye Near:    Bilateral Near:     Physical Exam Vitals and nursing note reviewed. Exam conducted with a chaperone present Jacklynn Lewis, RN).  Constitutional:      Appearance: Normal appearance. She is not ill-appearing.  HENT:     Head: Normocephalic and atraumatic.  Genitourinary:    Comments: Patient has a kidney bean size abscess on the apex of her right labia near her inguinal crease.  It is open and draining a thick yellow pus. Skin:    General: Skin is warm and dry.     Capillary Refill: Capillary refill takes less than 2 seconds.  Neurological:     General: No focal deficit present.     Mental Status: She is alert and oriented to person, place, and time.      UC Treatments / Results  Labs (all labs ordered are listed, but only abnormal results are displayed) Labs Reviewed - No data to display  EKG   Radiology No results found.  Procedures Procedures (including critical care time)  Medications Ordered in UC Medications - No data to display  Initial Impression / Assessment and Plan / UC Course  I have reviewed the triage vital signs and the nursing notes.  Pertinent labs & imaging results that were available during my care of the patient were reviewed by me and considered in my medical decision making (see chart for details).   Patient is a  nontoxic-appearing 82 year old female presenting for evaluation of right labial abscess as outlined HPI above.  With Jacklynn Lewis, RN as chaperone I did visually inspect the area.  The patient has a 1 cm by half centimeter area of induration with an opening draining a thick yellow pus.  There is mild erythema to the immediate surrounding tissue.  No appreciable lymphadenopathy.  I will discharge patient home on doxycycline 100 mg twice daily for treatment of her labial abscess as she has a history of chronic kidney disease and her current creatinine clearance is 26 mL/minute.  I have also advised the patient to continuously apply warm compresses to the area to help facilitate drainage of the remainder of the infection locked in the tissue.  If she develops any fever, increased redness, or other concerning symptoms she needs to return for reevaluation.   Final Clinical Impressions(s) / UC Diagnoses   Final diagnoses:  Labial abscess     Discharge Instructions      Take the Doxycycline twice daily with food for 10 days.  Doxycycline will make you more sensitive to sunburn so wear sunscreen when outdoors and reapply it every 90 minutes.  Apply warm compresses to help promote drainage.  Use OTC Tylenol according to the package instructions as needed for pain.  Return for new or worsening symptoms.       ED Prescriptions     Medication Sig Dispense Auth. Provider   doxycycline (VIBRAMYCIN) 100 MG capsule Take 1 capsule (100 mg total) by mouth 2 (two) times daily. 20 capsule Becky Augusta, NP      PDMP not reviewed this encounter.   Becky Augusta, NP 03/29/23  1421  

## 2023-03-29 NOTE — Discharge Instructions (Addendum)
Take the Doxycycline twice daily with food for 10 days.  Doxycycline will make you more sensitive to sunburn so wear sunscreen when outdoors and reapply it every 90 minutes.  Apply warm compresses to help promote drainage.  Use OTC Tylenol according to the package instructions as needed for pain.  Return for new or worsening symptoms.

## 2023-03-29 NOTE — ED Triage Notes (Signed)
Patient reports abscess on her labia that started on Saturday.  Patient states that she wear diapers because of her urinary incontinence.  Patient states that she noticed that she has been having yellow drainage from the area.

## 2023-07-25 IMAGING — CT CT HEAD W/O CM
3 series · 14 of 47 positions shown, 16 images · non-contrast
Comparison: CT brain, 09/10/2019

CLINICAL DATA: Found on floor, right-sided weakness

EXAM:
CT HEAD WITHOUT CONTRAST
CT CERVICAL SPINE WITHOUT CONTRAST
TECHNIQUE: Multidetector CT imaging of the head and cervical spine was
performed following the standard protocol without intravenous
contrast. Multiplanar CT image reconstructions of the cervical spine
were also generated.

[Series 2: head wo · axial · 0.44mm/px · z∈[-129,+11]mm · 8 of 34 slices shown, 10 images]
[im 3/34  brain]
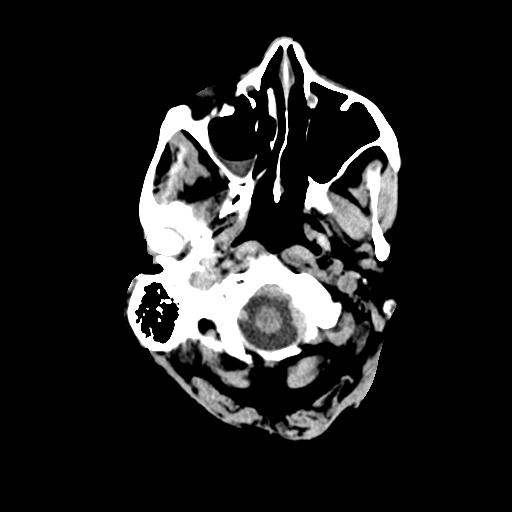
[im 3/34  bone]
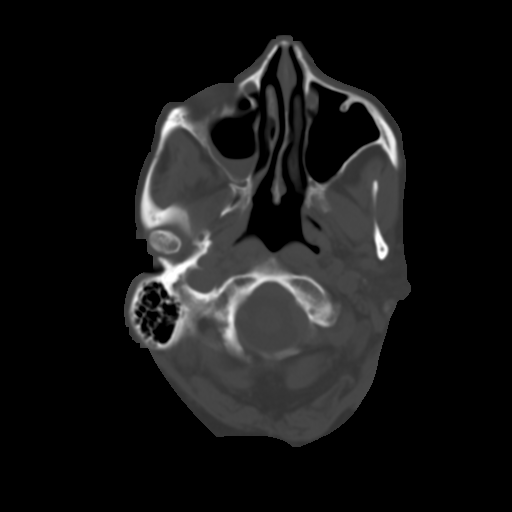
[im 7/34  brain]
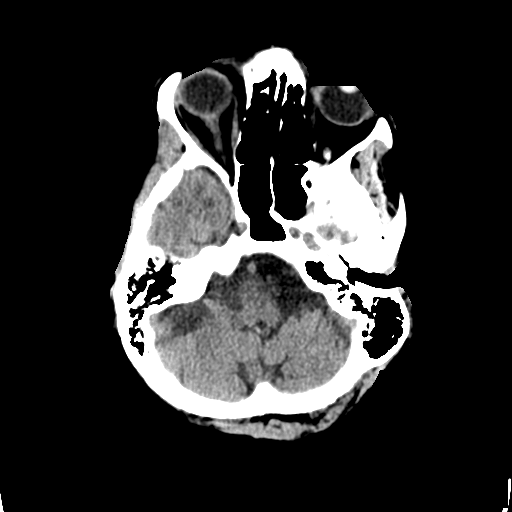
[im 11/34  brain]
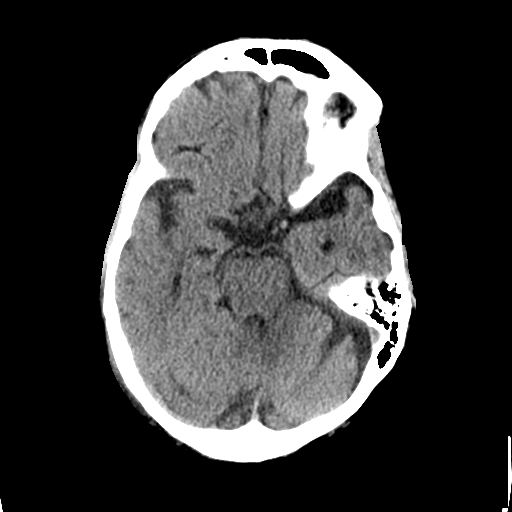
[im 15/34  brain]
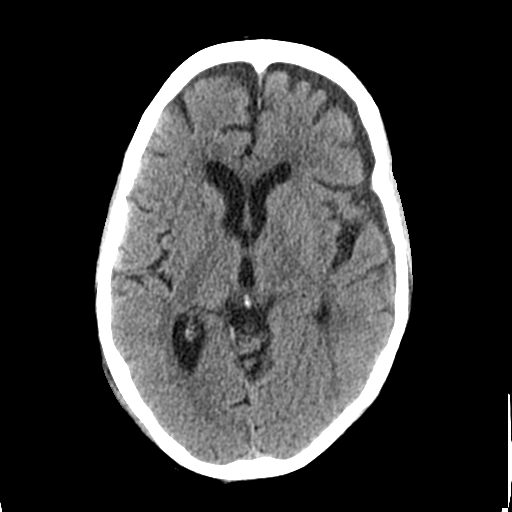
[im 19/34  brain]
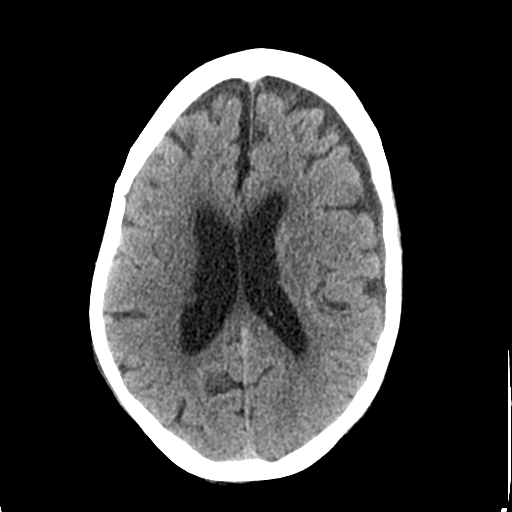
[im 19/34  bone]
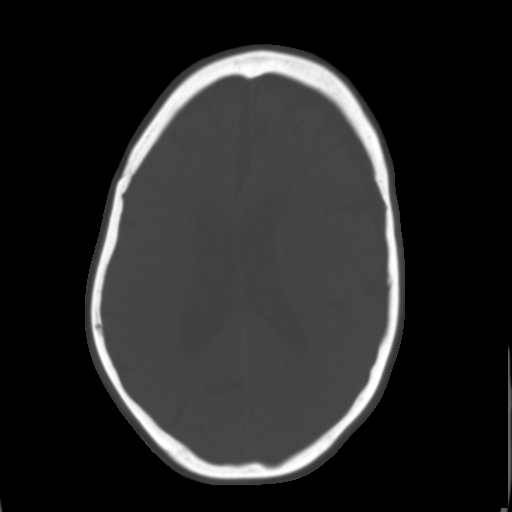
[im 23/34  brain]
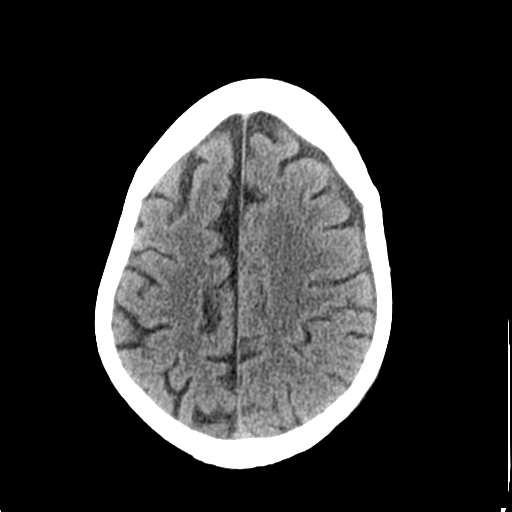
[im 27/34  brain]
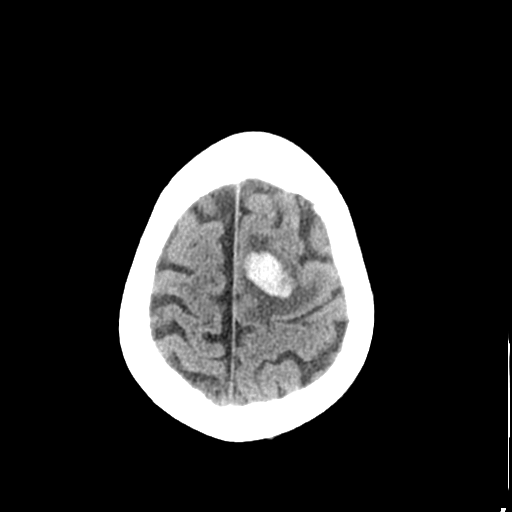
[im 31/34  brain]
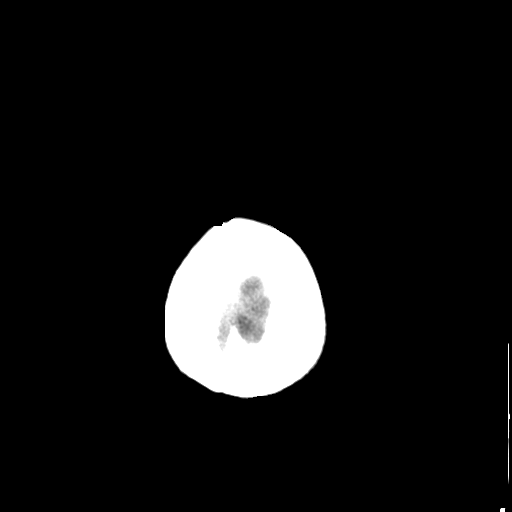

[Series 4: coronal soft tissue · coronal · 0.32mm/px · 3 of 73 slices shown]
[im 25/73  brain]
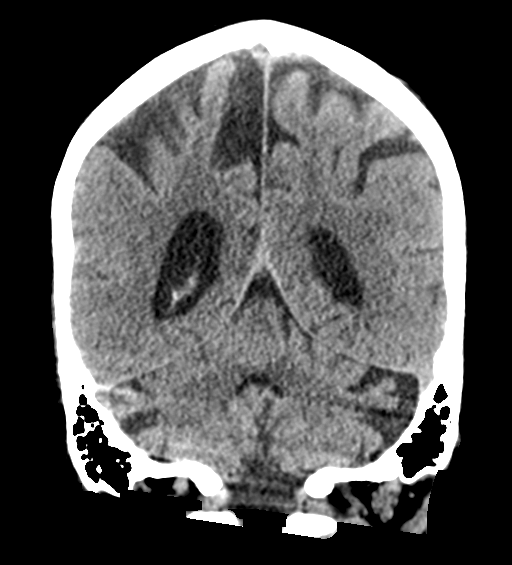
[im 33/73  brain]
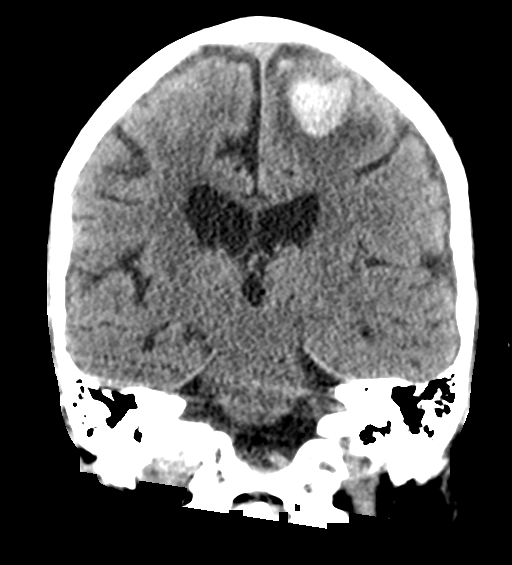
[im 41/73  brain]
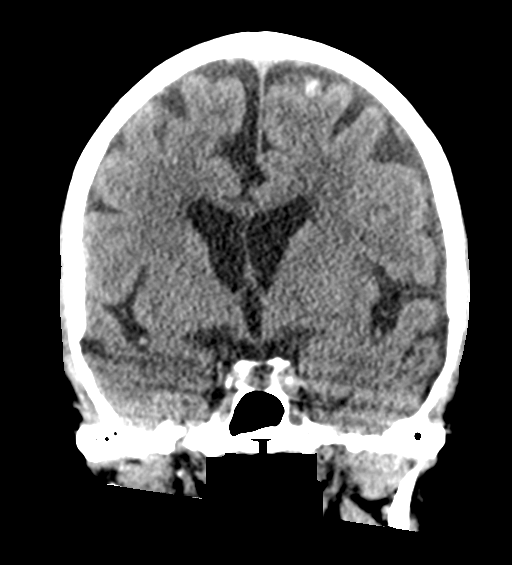

[Series 5: sagittal soft tissue · sagittal · 0.37mm/px · 3 of 55 slices shown]
[im 20/55  brain]
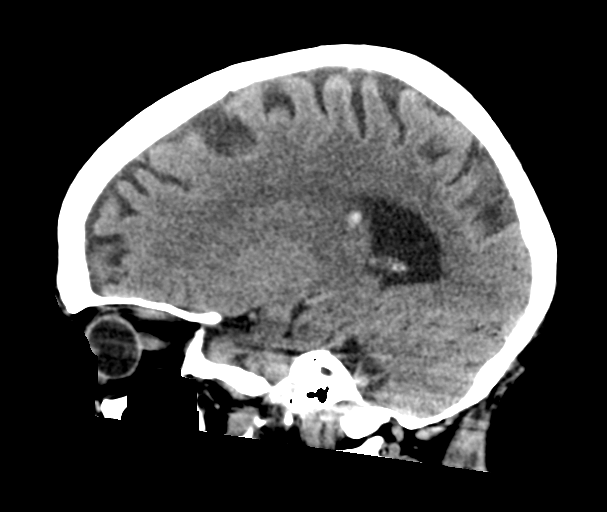
[im 28/55  brain]
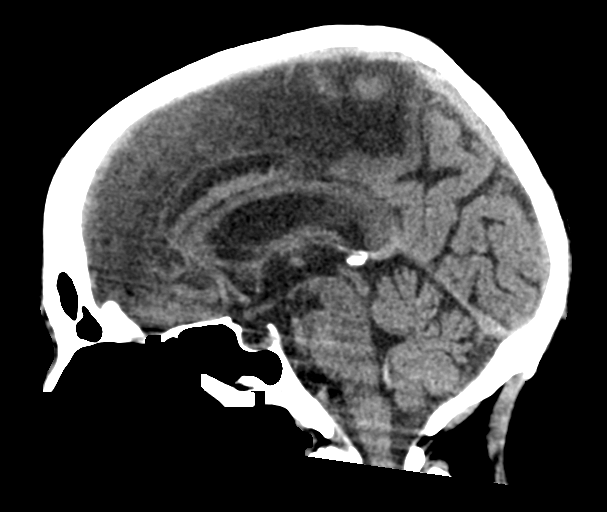
[im 35/55  brain]
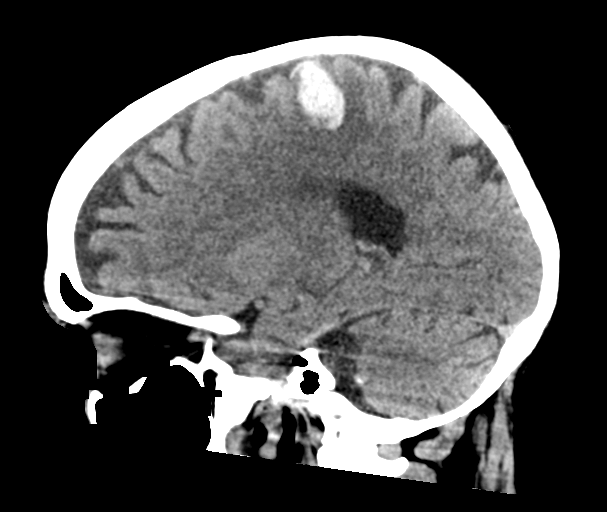

[14 of 47 positions shown; findings below may reference images not displayed]

FINDINGS: CT HEAD FINDINGS

Brain: No evidence of acute infarction, hydrocephalus, extra-axial
collection or mass lesion/mass effect. There is an intraparenchymal
hemorrhage within the medial high left frontal lobe measuring 2.7 cm
with adjacent edema (series 2, image 28). There is minimal
associated subarachnoid hemorrhage in this vicinity (series 2, image
28). Approximately 4 mm left right midline shift. There is an
additional very subtle cortical based hyperdense lesion of the
medial high right frontal lobe measuring 0.4 cm (series 2, image
29). There is a hyperdense subependymal lesion about the posterior
right lateral ventricle measuring 0.9 cm (series 2, image 18).

Vascular: No hyperdense vessel or unexpected calcification.

Skull: Normal. Negative for fracture or focal lesion.

Sinuses/Orbits: No acute finding.

Other: None.

CT CERVICAL SPINE FINDINGS

Alignment: Degenerative straightening and reversal of the normal
cervical lordosis.

Skull base and vertebrae: No acute fracture. No primary bone lesion
or focal pathologic process.

Soft tissues and spinal canal: No prevertebral fluid or swelling. No
visible canal hematoma.

Disc levels: Moderate to severe disc space height loss and
osteophytosis, worst from C4 through C6.

Upper chest: Negative.

Other: None.
IMPRESSION: 1. There is an intraparenchymal hemorrhage within the medial high
left frontal lobe measuring 2.7 cm with adjacent edema. There is
minimal associated subarachnoid hemorrhage in this vicinity.
2. There is approximately 4 mm left right midline shift.
3. There is an additional very subtle cortical based hyperdense
lesion of the medial high right frontal lobe measuring 0.4 cm, as
well as a hyperdense subependymal lesion about the posterior right
lateral ventricle measuring 0.9 cm.
4. Constellation of findings is generally suspicious for hemorrhage
associated with metastatic lesions. Small emboli from a central
source could also produce this appearance. Consider contrast
enhanced MRI to further evaluate.
5. No fracture or static subluxation of the cervical spine.
6. Cervical disc degenerative disease.

Findings discussed by telephone with Dr. Bihaw at [DATE] p.m.,
03/13/2021.

## 2023-11-07 ENCOUNTER — Encounter: Payer: Self-pay | Admitting: Intensive Care

## 2023-11-07 ENCOUNTER — Emergency Department
Admission: EM | Admit: 2023-11-07 | Discharge: 2023-11-07 | Disposition: A | Discharge status: Home / Self Care | Attending: Emergency Medicine | Admitting: Emergency Medicine

## 2023-11-07 ENCOUNTER — Other Ambulatory Visit: Payer: Self-pay

## 2023-11-07 DIAGNOSIS — Z5321 Procedure and treatment not carried out due to patient leaving prior to being seen by health care provider: Secondary | ICD-10-CM | POA: Diagnosis not present

## 2023-11-07 DIAGNOSIS — F410 Panic disorder [episodic paroxysmal anxiety] without agoraphobia: Secondary | ICD-10-CM | POA: Diagnosis present

## 2023-11-07 HISTORY — DX: Unspecified osteoarthritis, unspecified site: M19.90

## 2023-11-07 NOTE — ED Provider Triage Note (Signed)
 Emergency Medicine Provider Triage Evaluation Note  Jenna Powers , a 83 y.o. female  was evaluated in triage.  Pt complains of panic attack that started this morning. She took her home meds, but they didn't work right away so she decided to come to the hospital. She feels better now, but wants to see psychiatry to figure out how to prevent them from happening.  Physical Exam  BP (!) 140/79 (BP Location: Left Arm)   Pulse 77   Temp 99 F (37.2 C) (Oral)   Resp 16   Ht 5\' 3"  (1.6 m)   Wt 68.5 kg   SpO2 94%   BMI 26.75 kg/m  Gen:   Awake, no distress   Resp:  Normal effort  MSK:   Moves extremities without difficulty  Other:    Medical Decision Making  Medically screening exam initiated at 1:09 PM.  Appropriate orders placed.  Jenna Powers was informed that the remainder of the evaluation will be completed by another provider, this initial triage assessment does not replace that evaluation, and the importance of remaining in the ED until their evaluation is complete.    Chinita Pester, FNP 11/07/23 1311

## 2023-11-07 NOTE — ED Triage Notes (Signed)
 Patient arrived by [facility] from home for panic attack. Patient reports as soon as she got in EMS she felt fine. See first nurse note.  Patient asking in triage room to leave and how will she get home.

## 2023-11-07 NOTE — ED Triage Notes (Signed)
 First Nurse Note;  Pt via [facility] from home. Pt c/o panic attack this AM. Took 2 of her medication that did not help. She called EMS. Reports she is feeling better now. Pt is A&Ox4 and NAD  180 CBG  90 HR  96% on RA  128/68 BP

## 2023-11-07 NOTE — ED Notes (Signed)
 Pt called cab company and left in a cab. No assistance needed.
